# Patient Record
Sex: Female | Born: 1970 | Race: White | Hispanic: No | Marital: Single | Smoking: Current every day smoker
Health system: Southern US, Community
[De-identification: ages and names within clinical notes are randomized; demographics above are authoritative.]

## PROBLEM LIST (undated history)

## (undated) DIAGNOSIS — D649 Anemia, unspecified: Secondary | ICD-10-CM

## (undated) DIAGNOSIS — I729 Aneurysm of unspecified site: Secondary | ICD-10-CM

## (undated) DIAGNOSIS — G43909 Migraine, unspecified, not intractable, without status migrainosus: Secondary | ICD-10-CM

## (undated) DIAGNOSIS — M797 Fibromyalgia: Secondary | ICD-10-CM

## (undated) DIAGNOSIS — IMO0001 Reserved for inherently not codable concepts without codable children: Secondary | ICD-10-CM

## (undated) DIAGNOSIS — I731 Thromboangiitis obliterans [Buerger's disease]: Secondary | ICD-10-CM

## (undated) HISTORY — DX: Thromboangiitis obliterans (Buerger's disease): I73.1

## (undated) HISTORY — DX: Fibromyalgia: M79.7

## (undated) HISTORY — PX: CHOLECYSTECTOMY: SHX55

## (undated) HISTORY — DX: Migraine, unspecified, not intractable, without status migrainosus: G43.909

## (undated) HISTORY — PX: CERVICAL BIOPSY  W/ LOOP ELECTRODE EXCISION: SUR135

---

## 1999-06-12 ENCOUNTER — Encounter: Payer: Self-pay | Admitting: Occupational Medicine

## 1999-06-12 ENCOUNTER — Encounter: Admission: RE | Admit: 1999-06-12 | Discharge: 1999-06-12 | Payer: Self-pay | Admitting: Occupational Medicine

## 1999-06-30 ENCOUNTER — Encounter: Admission: RE | Admit: 1999-06-30 | Discharge: 1999-09-28 | Payer: Self-pay | Admitting: Occupational Medicine

## 2000-05-02 ENCOUNTER — Encounter: Payer: Self-pay | Admitting: Family Medicine

## 2000-05-02 ENCOUNTER — Encounter: Admission: RE | Admit: 2000-05-02 | Discharge: 2000-05-02 | Payer: Self-pay | Admitting: Family Medicine

## 2004-11-16 ENCOUNTER — Emergency Department (HOSPITAL_COMMUNITY): Admission: EM | Admit: 2004-11-16 | Discharge: 2004-11-16 | Payer: Self-pay | Admitting: Family Medicine

## 2006-01-20 ENCOUNTER — Encounter: Admission: RE | Admit: 2006-01-20 | Discharge: 2006-01-20 | Payer: Self-pay | Admitting: Family Medicine

## 2006-02-04 ENCOUNTER — Encounter: Admission: RE | Admit: 2006-02-04 | Discharge: 2006-03-08 | Payer: Self-pay | Admitting: Family Medicine

## 2006-02-18 ENCOUNTER — Encounter: Admission: RE | Admit: 2006-02-18 | Discharge: 2006-02-18 | Payer: Self-pay | Admitting: Family Medicine

## 2007-04-19 ENCOUNTER — Encounter: Admission: RE | Admit: 2007-04-19 | Discharge: 2007-04-19 | Payer: Self-pay | Admitting: *Deleted

## 2009-08-19 ENCOUNTER — Emergency Department (HOSPITAL_COMMUNITY): Admission: EM | Admit: 2009-08-19 | Discharge: 2009-08-19 | Payer: Self-pay | Admitting: Family Medicine

## 2010-11-14 ENCOUNTER — Emergency Department (HOSPITAL_COMMUNITY)
Admission: EM | Admit: 2010-11-14 | Discharge: 2010-11-14 | Disposition: A | Discharge status: Home / Self Care | Payer: Medicaid Other | Attending: Emergency Medicine | Admitting: Emergency Medicine

## 2010-11-14 DIAGNOSIS — M549 Dorsalgia, unspecified: Secondary | ICD-10-CM | POA: Insufficient documentation

## 2010-11-14 LAB — URINALYSIS, ROUTINE W REFLEX MICROSCOPIC
Ketones, ur: NEGATIVE mg/dL
Nitrite: NEGATIVE
Protein, ur: NEGATIVE mg/dL
pH: 5 (ref 5.0–8.0)

## 2010-11-14 LAB — URINE MICROSCOPIC-ADD ON

## 2010-11-14 LAB — POCT PREGNANCY, URINE: Preg Test, Ur: NEGATIVE

## 2010-11-22 ENCOUNTER — Emergency Department (HOSPITAL_COMMUNITY): Payer: Medicaid Other

## 2010-11-22 ENCOUNTER — Encounter: Payer: Self-pay | Admitting: Emergency Medicine

## 2010-11-22 ENCOUNTER — Emergency Department (HOSPITAL_COMMUNITY)
Admission: EM | Admit: 2010-11-22 | Discharge: 2010-11-22 | Disposition: A | Discharge status: Home / Self Care | Payer: Medicaid Other | Attending: Emergency Medicine | Admitting: Emergency Medicine

## 2010-11-22 DIAGNOSIS — R209 Unspecified disturbances of skin sensation: Secondary | ICD-10-CM | POA: Insufficient documentation

## 2010-11-22 DIAGNOSIS — M545 Low back pain, unspecified: Secondary | ICD-10-CM | POA: Insufficient documentation

## 2010-11-22 DIAGNOSIS — F17200 Nicotine dependence, unspecified, uncomplicated: Secondary | ICD-10-CM | POA: Insufficient documentation

## 2010-11-22 DIAGNOSIS — M5430 Sciatica, unspecified side: Secondary | ICD-10-CM | POA: Insufficient documentation

## 2010-11-22 HISTORY — DX: Anemia, unspecified: D64.9

## 2010-11-22 HISTORY — DX: Aneurysm of unspecified site: I72.9

## 2010-11-22 MED ORDER — PREDNISONE 20 MG PO TABS: ORAL_TABLET | ORAL | Status: DC

## 2010-11-22 MED ORDER — OXYCODONE-ACETAMINOPHEN 5-325 MG PO TABS
1.0000 | ORAL_TABLET | ORAL | Status: AC | PRN
Start: 2010-11-22 — End: 2010-12-02

## 2010-11-22 MED ORDER — OXYCODONE-ACETAMINOPHEN 5-325 MG PO TABS
1.0000 | ORAL_TABLET | Freq: Once | ORAL | Status: AC
  Administered 2010-11-22: 1 via ORAL
  Filled 2010-11-22: qty 1

## 2010-11-22 NOTE — ED Notes (Signed)
Pt c/o lower back pain x 3 weeks. Denies gi/gu.

## 2010-11-22 NOTE — ED Provider Notes (Signed)
History     CSN: [id number] Arrival date & time: 11/22/2010  3:18 PM  Chief Complaint  Patient presents with  . Back Pain    (Consider location/radiation/quality/duration/timing/severity/associated sxs/prior treatment) HPI Comments: Patient is a 40 year old woman who says she has severe lower back pain. She feels tingling from her lower back into the toes both legs. This has been going on for 3 weeks. She denies injury. She was seen at [facility] cone the ED 8 days ago and was prescribed nonsteroidal anti-inflammatory medications and muscle relaxants. These have not seemed to help. She therefore seeks evaluation.   Patient is a 40 y.o. female presenting with back pain. The history is provided by the patient. No language interpreter was used.  Back Pain  This is a recurrent problem. The current episode started more than 1 week ago. The problem occurs constantly. The problem has been gradually worsening. The pain is associated with no known injury. The pain is present in the lumbar spine. Radiates to: She has tingling in both legs. Pertinent negatives include no fever. She has tried NSAIDs and muscle relaxants for the symptoms. The treatment provided no relief.    Past Medical History  Diagnosis Date  . Anemia   . Aneurysm     Past Surgical History  Procedure Date  . Cholecystectomy   . Cesarean section     History reviewed. No pertinent family history.  History  Substance Use Topics  . Smoking status: Current Everyday Smoker -- 1.0 packs/day  . Smokeless tobacco: Not on file  . Alcohol Use: No    OB History    Grav Para Term Preterm Abortions TAB SAB Ect Mult Living   4 3 3  1  1   3       Review of Systems  Constitutional: Negative.  Negative for fever.  HENT: Negative.   Eyes: Negative.   Respiratory: Negative.   Cardiovascular: Negative.   Musculoskeletal: Positive for back pain.    Allergies  Vicodin  Home Medications   Current Outpatient Rx  Name Route Sig  Dispense Refill  . OXYCODONE-ACETAMINOPHEN 5-325 MG PO TABS Oral Take 1 tablet by mouth every 4 (four) hours as needed for pain. 20 tablet 0  . PREDNISONE 20 MG PO TABS  Take prednisone 20 mg, 3 tablets today and Monday, then 2 20 mg tablets Tuesday and Wednesday, and then 1 20 mg tablet Thursday and Friday. 12 tablet 0    BP 139/79  Pulse 111  Temp 98.9 F (37.2 C)  Resp 20  Ht 5\' 6"  (1.676 m)  Wt 165 lb (74.844 kg)  BMI 26.63 kg/m2  SpO2 100%  Physical Exam  Constitutional: She is oriented to person, place, and time. She appears well-developed and well-nourished. Distressed: in moderate distress with low back pain.  HENT:  Head: Normocephalic and atraumatic.  Right Ear: External ear normal.  Left Ear: External ear normal.  Eyes: Conjunctivae and EOM are normal. Pupils are equal, round, and reactive to light.  Neck: Neck supple.  Cardiovascular: Normal rate, regular rhythm and normal heart sounds.   Pulmonary/Chest: Effort normal and breath sounds normal. No respiratory distress.  Abdominal: Soft. Bowel sounds are normal. There is no tenderness.  Musculoskeletal:       She localizes pain to the lumbar region. There is no palpable deformity or bony or muscular tenderness to palpation.  Lymphadenopathy:    She has no cervical adenopathy.  Neurological: She is alert and oriented to person, place, and time.  There is no sensory or motor loss.  Skin: Skin is warm and dry.  Psychiatric: She has a normal mood and affect.    ED Course  Procedures (including critical care time)  5:24 PM Pt seen --> physical exam performed.  Prior records from previous ED visit reviewed.  X-ray of LS spine ordered.  PO Percocet ordered.  5:24 PM LS spine x-rays negative.  Rx for sciatica with Percocet for pain, steroid taper.  If she has further problems, she would need evaluation by a neurosurgeon, referred for this to Dr. Marikay Alar.  Labs Reviewed - No data to display Dg Lumbar Spine  Complete  11/22/2010  *RADIOLOGY REPORT*  Clinical Data: Back pain.  LUMBAR SPINE - COMPLETE 4+ VIEW  Comparison: None  Findings: The lateral film demonstrates normal alignment. Vertebral bodies and disc spaces are maintained.  No acute bony findings.  Normal alignment of the facet joints and no pars defects.  The visualized bony pelvis in intact.  IMPRESSION: Normal alignment and no acute bony findings.  Original Report Authenticated By: P. Loralie Champagne, M.D.     1. Sciatica       MDM          Carleene Cooper III, MD 11/22/10 [phone]

## 2010-11-28 ENCOUNTER — Emergency Department (HOSPITAL_COMMUNITY)
Admission: EM | Admit: 2010-11-28 | Discharge: 2010-11-28 | Disposition: A | Discharge status: Other Acute Inpatient Hospital | Payer: Medicaid Other | Source: Home / Self Care | Attending: Emergency Medicine | Admitting: Emergency Medicine

## 2010-11-28 ENCOUNTER — Encounter (HOSPITAL_COMMUNITY): Payer: Self-pay | Admitting: Emergency Medicine

## 2010-11-28 ENCOUNTER — Emergency Department (HOSPITAL_COMMUNITY): Payer: Medicaid Other

## 2010-11-28 ENCOUNTER — Emergency Department (HOSPITAL_COMMUNITY)
Admission: EM | Admit: 2010-11-28 | Discharge: 2010-11-29 | Disposition: A | Discharge status: Home / Self Care | Payer: Medicaid Other | Attending: Emergency Medicine | Admitting: Emergency Medicine

## 2010-11-28 DIAGNOSIS — M545 Low back pain, unspecified: Secondary | ICD-10-CM | POA: Insufficient documentation

## 2010-11-28 DIAGNOSIS — R209 Unspecified disturbances of skin sensation: Secondary | ICD-10-CM | POA: Insufficient documentation

## 2010-11-28 DIAGNOSIS — R5381 Other malaise: Secondary | ICD-10-CM | POA: Insufficient documentation

## 2010-11-28 DIAGNOSIS — R52 Pain, unspecified: Secondary | ICD-10-CM | POA: Insufficient documentation

## 2010-11-28 DIAGNOSIS — R32 Unspecified urinary incontinence: Secondary | ICD-10-CM | POA: Insufficient documentation

## 2010-11-28 DIAGNOSIS — G8929 Other chronic pain: Secondary | ICD-10-CM | POA: Insufficient documentation

## 2010-11-28 DIAGNOSIS — R29898 Other symptoms and signs involving the musculoskeletal system: Secondary | ICD-10-CM | POA: Insufficient documentation

## 2010-11-28 DIAGNOSIS — M5416 Radiculopathy, lumbar region: Secondary | ICD-10-CM

## 2010-11-28 DIAGNOSIS — R5383 Other fatigue: Secondary | ICD-10-CM | POA: Insufficient documentation

## 2010-11-28 DIAGNOSIS — IMO0002 Reserved for concepts with insufficient information to code with codable children: Secondary | ICD-10-CM | POA: Insufficient documentation

## 2010-11-28 DIAGNOSIS — F17200 Nicotine dependence, unspecified, uncomplicated: Secondary | ICD-10-CM | POA: Insufficient documentation

## 2010-11-28 DIAGNOSIS — M79609 Pain in unspecified limb: Secondary | ICD-10-CM | POA: Insufficient documentation

## 2010-11-28 DIAGNOSIS — R269 Unspecified abnormalities of gait and mobility: Secondary | ICD-10-CM | POA: Insufficient documentation

## 2010-11-28 LAB — CBC
HCT: 39 % (ref 36.0–46.0)
MCV: 87.4 fL (ref 78.0–100.0)
RBC: 4.46 MIL/uL (ref 3.87–5.11)
WBC: 11.6 10*3/uL — ABNORMAL HIGH (ref 4.0–10.5)

## 2010-11-28 LAB — DIFFERENTIAL
Lymphocytes Relative: 31 % (ref 12–46)
Lymphs Abs: 3.6 10*3/uL (ref 0.7–4.0)
Neutrophils Relative %: 58 % (ref 43–77)

## 2010-11-28 LAB — POCT I-STAT, CHEM 8
BUN: 16 mg/dL (ref 6–23)
Chloride: 101 mEq/L (ref 96–112)
Glucose, Bld: 80 mg/dL (ref 70–99)
HCT: 42 % (ref 36.0–46.0)
Potassium: 3.9 mEq/L (ref 3.5–5.1)

## 2010-11-28 MED ORDER — ONDANSETRON 8 MG PO TBDP
8.0000 mg | ORAL_TABLET | Freq: Once | ORAL | Status: AC
  Administered 2010-11-28: 8 mg via ORAL
  Filled 2010-11-28: qty 1

## 2010-11-28 MED ORDER — HYDROMORPHONE HCL 1 MG/ML IJ SOLN
2.0000 mg | Freq: Once | INTRAMUSCULAR | Status: AC
  Administered 2010-11-28: 2 mg via INTRAMUSCULAR
  Filled 2010-11-28: qty 2

## 2010-11-28 NOTE — ED Notes (Signed)
[facility] called for transport to [facility] ED as requested by Burgess Amor, PA.

## 2010-11-28 NOTE — ED Notes (Signed)
Resting quietly in bed. Continues to complain of back pain and weakness to lower extremities. Family at bedside. NAD noted.

## 2010-11-28 NOTE — ED Provider Notes (Signed)
History     CSN: [id number] Arrival date & time: 11/28/2010 11:04 AM  Chief Complaint  Patient presents with  . Back Pain    (Consider location/radiation/quality/duration/timing/severity/associated sxs/prior treatment) Patient is a 40 y.o. female presenting with back pain. The history is provided by the patient.  Back Pain  This is a chronic problem. Episode onset: she reports a 1 month history of low back pain which has slowly progressing until today when she was unable to get out of bed due to severe pain.  She denies injury. The problem occurs constantly. The problem has been gradually worsening. The pain is associated with no known injury. The pain is present in the lumbar spine. The pain radiates to the right foot and left foot. The pain is at a severity of 10/10. The pain is severe. The symptoms are aggravated by twisting, bending and certain positions. The pain is the same all the time. Associated symptoms include numbness, tingling and weakness. Pertinent negatives include no chest pain, no fever, no headaches, no abdominal pain, no bowel incontinence and no perianal numbness. Associated symptoms comments: She reports 2 episodes of urinary incontinence since last night,  Stating when she stands to try to get to the bathroom,  She has simply lost control of her urine.  Her last bowel movement was 2 days ago,  She denies the urge to go,  But points out she is on narcotics,  So may be constipated..    Past Medical History  Diagnosis Date  . Anemia   . Aneurysm     Past Surgical History  Procedure Date  . Cholecystectomy   . Cesarean section     History reviewed. No pertinent family history.  History  Substance Use Topics  . Smoking status: Current Everyday Smoker -- 1.0 packs/day  . Smokeless tobacco: Not on file  . Alcohol Use: No    OB History    Grav Para Term Preterm Abortions TAB SAB Ect Mult Living   4 3 3  1  1   3       Review of Systems  Constitutional:  Negative for fever.  HENT: Negative for congestion, sore throat and neck pain.   Eyes: Negative.   Respiratory: Negative for chest tightness and shortness of breath.   Cardiovascular: Negative for chest pain.  Gastrointestinal: Negative for nausea, abdominal pain and bowel incontinence.  Genitourinary: Negative.  Negative for urgency, frequency, hematuria and decreased urine volume.  Musculoskeletal: Positive for back pain and gait problem. Negative for joint swelling and arthralgias.  Skin: Negative.  Negative for rash and wound.  Neurological: Positive for tingling, weakness and numbness. Negative for dizziness, light-headedness and headaches.  Hematological: Negative.   Psychiatric/Behavioral: Negative.     Allergies  Vicodin  Home Medications   Current Outpatient Rx  Name Route Sig Dispense Refill  . OXYCODONE-ACETAMINOPHEN 5-325 MG PO TABS Oral Take 1 tablet by mouth every 4 (four) hours as needed for pain. 20 tablet 0  . PREDNISONE 20 MG PO TABS  Take prednisone 20 mg, 3 tablets today and Monday, then 2 20 mg tablets Tuesday and Wednesday, and then 1 20 mg tablet Thursday and Friday. 12 tablet 0    BP 122/83  Pulse 94  Temp(Src) 98.3 F (36.8 C) (Oral)  Resp 20  SpO2 99%  LMP 11/27/2010  Physical Exam  Nursing note and vitals reviewed. Constitutional: She is oriented to person, place, and time. She appears well-developed and well-nourished. She appears distressed.  HENT:  Head: Normocephalic and atraumatic.  Eyes: Conjunctivae are normal.  Neck: Normal range of motion. Neck supple.  Cardiovascular: Normal rate, regular rhythm, normal heart sounds and intact distal pulses.        Pedal pulses normal.  Pulmonary/Chest: Effort normal and breath sounds normal. She has no wheezes.  Abdominal: Soft. Bowel sounds are normal. She exhibits no distension and no mass. There is no tenderness.  Genitourinary:       Decreased rectal tone,  But normal perineal sensation to touch.    Musculoskeletal: Normal range of motion. She exhibits no edema.       Lumbar back: She exhibits tenderness. She exhibits no swelling, no edema and no spasm.  Neurological: She is alert and oriented to person, place, and time. She has normal strength. She displays no atrophy and no tremor. No cranial nerve deficit or sensory deficit. Gait normal.  Reflex Scores:      Patellar reflexes are 1+ on the right side and 2+ on the left side.      Achilles reflexes are 2+ on the right side and 2+ on the left side.      Decreased strength in hip and knee flexor and extensor muscle groups.  Ankle flexion and extension intact.    Skin: Skin is warm and dry.  Psychiatric: She has a normal mood and affect.    ED Course  Procedures (including critical care time)  Labs Reviewed - No data to display No results found.   No diagnosis found.    MDM  Discussed with Dr Adriana Simas who also saw pt.  Agrees pt needs MRI,  Unable to obtain at [facility].  Spoke with Dr. Fonnie Jarvis with [facility] - accepts patient for transfer to [facility] for back pain protocol and MRI.        Candis Musa, PA 11/28/10 1527  Candis Musa, PA 11/28/10 [phone]

## 2010-11-28 NOTE — ED Notes (Signed)
Report to [facility].  

## 2010-11-28 NOTE — ED Notes (Signed)
Pt c/o lower back pain that has gotten worse over the last month.

## 2010-11-29 ENCOUNTER — Emergency Department (HOSPITAL_COMMUNITY): Payer: Medicaid Other

## 2010-11-29 NOTE — ED Provider Notes (Signed)
Medical screening examination/treatment/procedure(s) were conducted as a shared visit with non-physician practitioner(s) and myself.  I personally evaluated the patient during the encounter.   Low back pain with radiculopathy to both feet. Bowel and bladder incontinence. Will transferred to [facility] for MRI of lower back.  Donnetta Hutching, MD 11/29/10 [phone]

## 2011-07-27 ENCOUNTER — Other Ambulatory Visit: Payer: Self-pay | Admitting: Family Medicine

## 2011-07-27 ENCOUNTER — Ambulatory Visit
Admission: RE | Admit: 2011-07-27 | Discharge: 2011-07-27 | Disposition: A | Discharge status: Home / Self Care | Payer: Medicaid Other | Source: Ambulatory Visit | Attending: Family Medicine | Admitting: Family Medicine

## 2011-07-27 DIAGNOSIS — M542 Cervicalgia: Secondary | ICD-10-CM

## 2012-07-14 ENCOUNTER — Ambulatory Visit (INDEPENDENT_AMBULATORY_CARE_PROVIDER_SITE_OTHER): Payer: BC Managed Care – PPO | Admitting: Family Medicine

## 2012-07-14 ENCOUNTER — Encounter: Payer: Self-pay | Admitting: Family Medicine

## 2012-07-14 VITALS — BP 118/72 | HR 68 | Temp 98.2°F | Resp 16 | Wt 181.0 lb

## 2012-07-14 DIAGNOSIS — M797 Fibromyalgia: Secondary | ICD-10-CM | POA: Insufficient documentation

## 2012-07-14 DIAGNOSIS — IMO0001 Reserved for inherently not codable concepts without codable children: Secondary | ICD-10-CM

## 2012-07-14 DIAGNOSIS — G43909 Migraine, unspecified, not intractable, without status migrainosus: Secondary | ICD-10-CM | POA: Insufficient documentation

## 2012-07-14 MED ORDER — DULOXETINE HCL 30 MG PO CPEP: 60.0000 mg | ORAL_CAPSULE | Freq: Every day | ORAL | Status: DC

## 2012-07-14 NOTE — Progress Notes (Signed)
  Subjective:    Patient ID: Cassie Lynn, female    DOB: 1970-08-31, 42 y.o.   MRN: [id number]  HPI Patient has fibromyalgia. She's currently on the 150 mg by mouth twice a day her last clinic visit we added Cymbalta 30 mg a day. She states that this is helping her tremendously. She still complains of pain in her elbows hips knees and hands. His pain is more of a generalized ache and muscle discomfort. But it is much improved. She denies any fevers or chills. She denies any side effects on the Cymbalta. She likes try to increase it further. Past Medical History  Diagnosis Date  . Anemia   . Aneurysm    Current outpatient prescriptions:carisoprodol (SOMA) 350 MG tablet, Take 350 mg by mouth 4 (four) times daily as needed for muscle spasms., Disp: , Rfl: ;  divalproex (DEPAKOTE) 500 MG DR tablet, Take 500 mg by mouth. 2 tab po qhs, Disp: , Rfl: ;  DULoxetine (CYMBALTA) 30 MG capsule, Take 2 capsules (60 mg total) by mouth daily., Disp: 60 capsule, Rfl: 5;  pregabalin (LYRICA) 150 MG capsule, Take 150 mg by mouth 2 (two) times daily., Disp: , Rfl:  SUMAtriptan (IMITREX) 100 MG tablet, Take 100 mg by mouth every 2 (two) hours as needed for migraine., Disp: , Rfl: ;  topiramate (TOPAMAX) 50 MG tablet, Take 50 mg by mouth. 2 tabs po qhs, Disp: , Rfl:   Allergies  Allergen Reactions  . Vicodin (Hydrocodone-Acetaminophen) Other (See Comments)    Makes her feel like she cannot breath, but no throat swelling   History   Social History  . Marital Status: Single    Spouse Name: N/A    Number of Children: N/A  . Years of Education: N/A   Occupational History  . Not on file.   Social History Main Topics  . Smoking status: Current Every Day Smoker -- 1.00 packs/day  . Smokeless tobacco: Not on file  . Alcohol Use: No  . Drug Use: No  . Sexually Active:    Other Topics Concern  . Not on file   Social History Narrative  . No narrative on file      Review of Systems  All other  systems reviewed and are negative.       Objective:   Physical Exam  Vitals reviewed. Constitutional: She appears well-developed and well-nourished.  Cardiovascular: Normal rate, regular rhythm, normal heart sounds and intact distal pulses.   Pulmonary/Chest: Effort normal and breath sounds normal. No respiratory distress. She has no wheezes. She has no rales.  Abdominal: Soft. Bowel sounds are normal. She exhibits no distension. There is no tenderness. There is no rebound.          Assessment & Plan:  1. Fibromyalgia syndrome Continue Lyrica 150 mg by mouth twice a day. Add Cymbalta 60 mg by mouth daily. Recheck in one month.

## 2012-08-11 ENCOUNTER — Ambulatory Visit: Payer: Self-pay | Admitting: Nurse Practitioner

## 2012-09-04 ENCOUNTER — Other Ambulatory Visit: Payer: Self-pay | Admitting: Family Medicine

## 2012-09-25 ENCOUNTER — Other Ambulatory Visit: Payer: Self-pay | Admitting: Emergency Medicine

## 2012-10-19 ENCOUNTER — Telehealth: Payer: Self-pay | Admitting: Family Medicine

## 2012-10-19 MED ORDER — PREGABALIN 150 MG PO CAPS: ORAL_CAPSULE | ORAL | Status: DC

## 2012-10-19 NOTE — Telephone Encounter (Signed)
?   OK to Refill  

## 2012-10-19 NOTE — Telephone Encounter (Signed)
ok 

## 2012-10-19 NOTE — Telephone Encounter (Signed)
Rx Refilled  

## 2012-10-19 NOTE — Telephone Encounter (Signed)
Lyrica 150 mg cap 1 BID #60 last rf 07/18/12

## 2012-11-13 IMAGING — CR DG LUMBAR SPINE COMPLETE 4+V
5 series · 5 of 5 positions shown · non-contrast
Comparison: None

CLINICAL DATA: Back pain.

LUMBAR SPINE - COMPLETE 4+ VIEW

[view not recorded (1 of 5)]
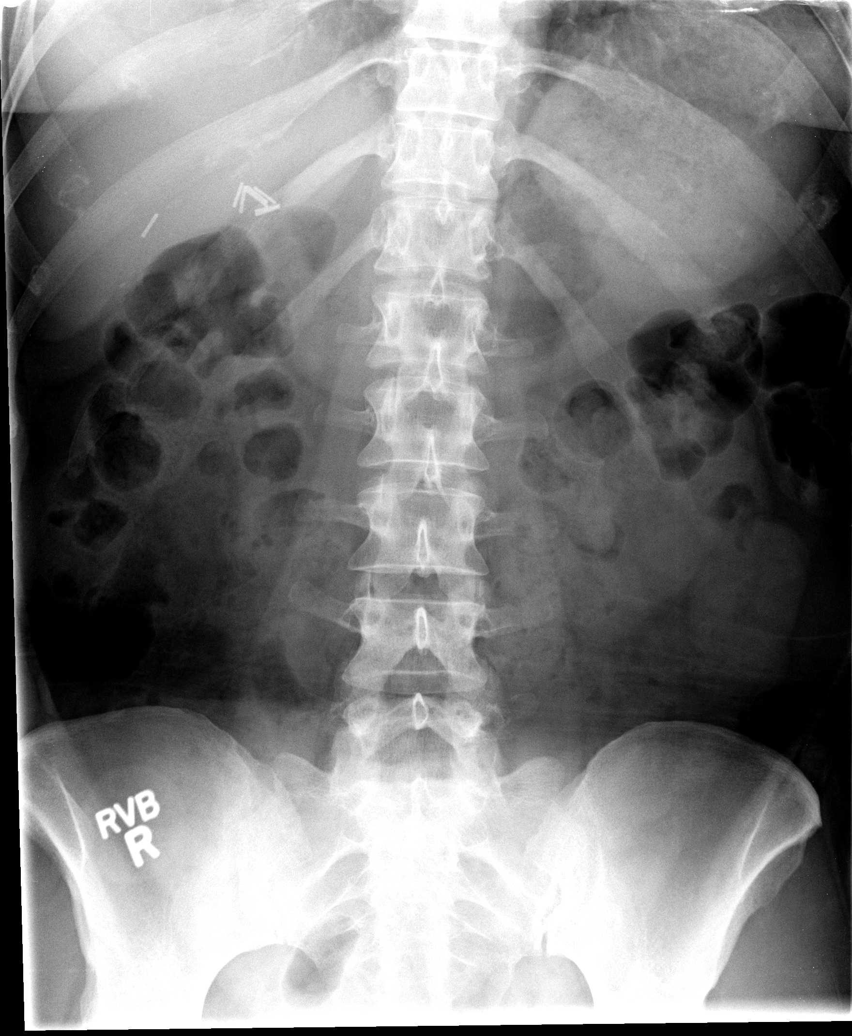

[view not recorded (2 of 5)]
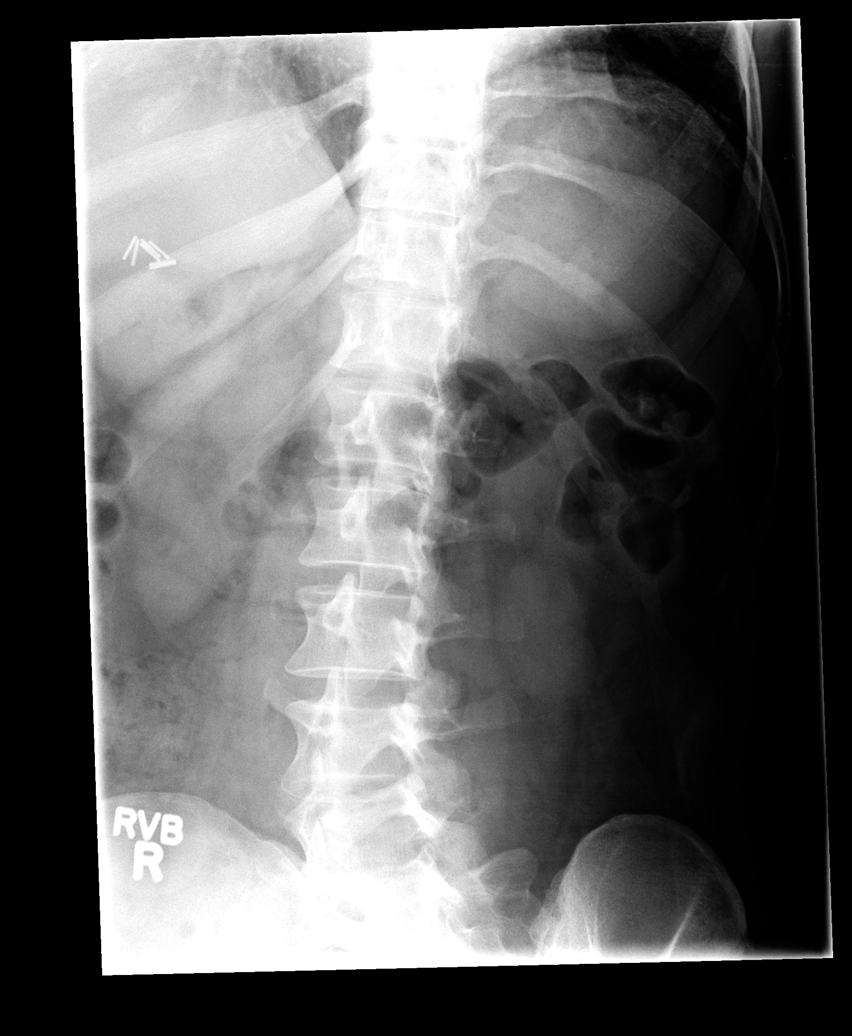

[view not recorded (3 of 5)]
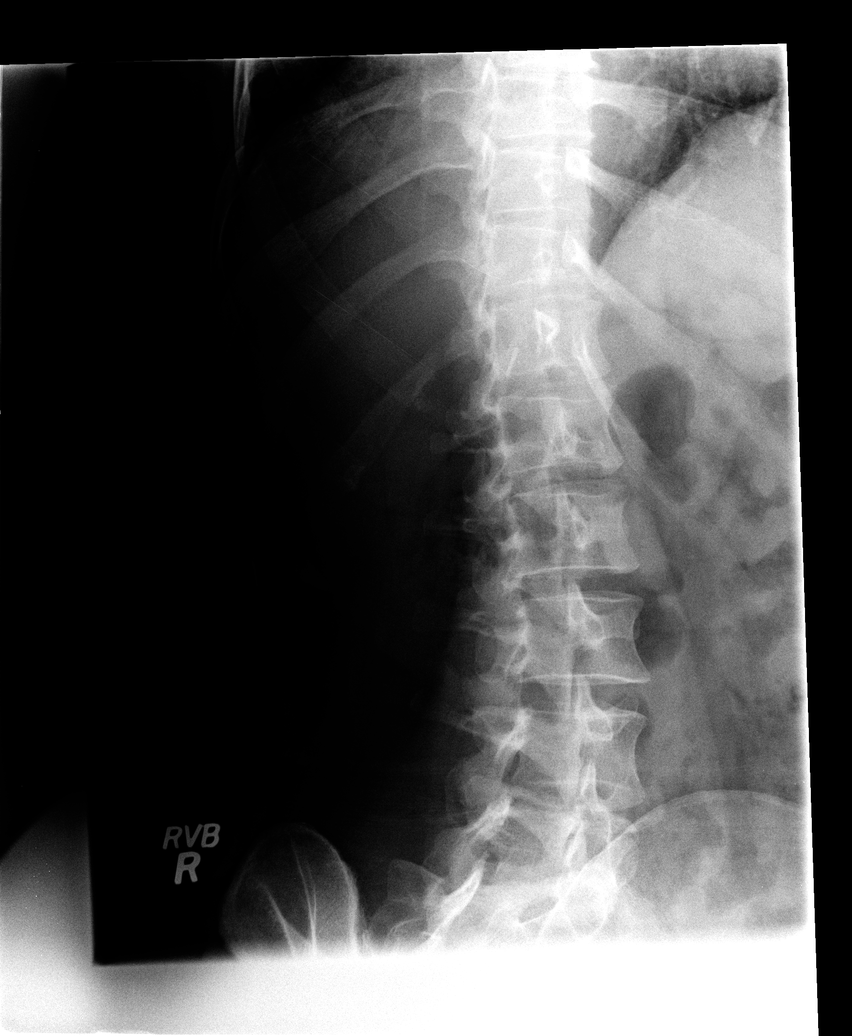

[view not recorded (4 of 5)]
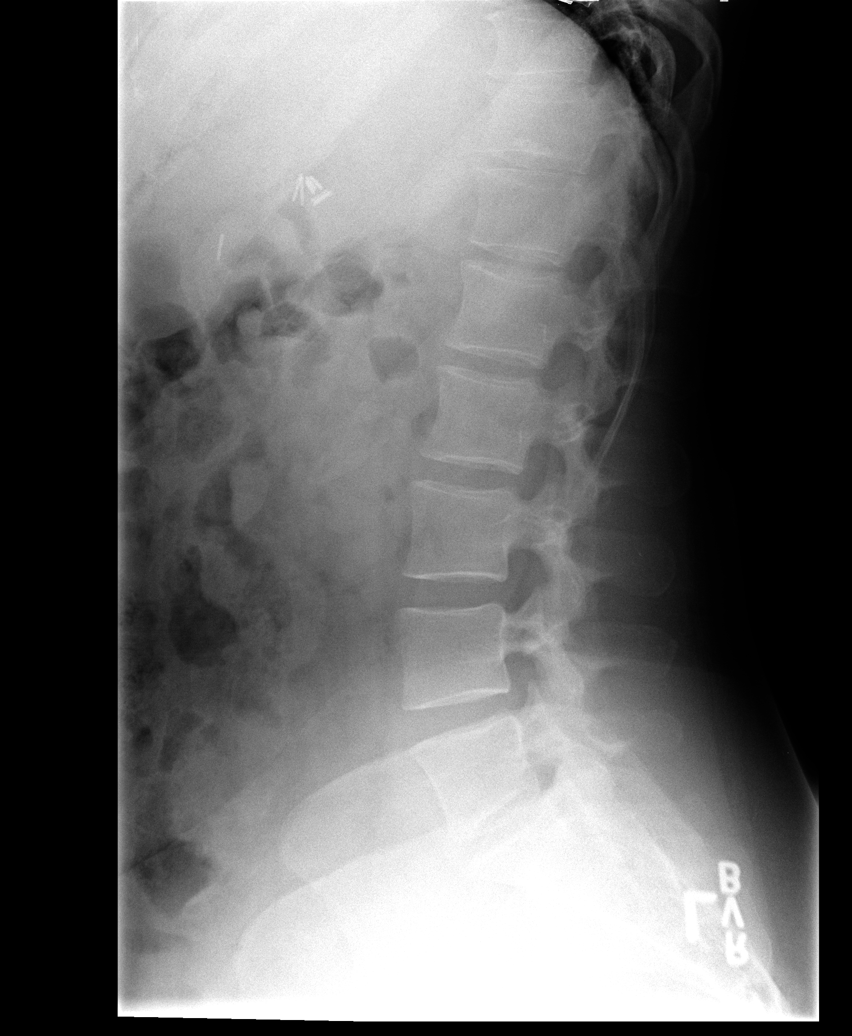

[view not recorded (5 of 5)]
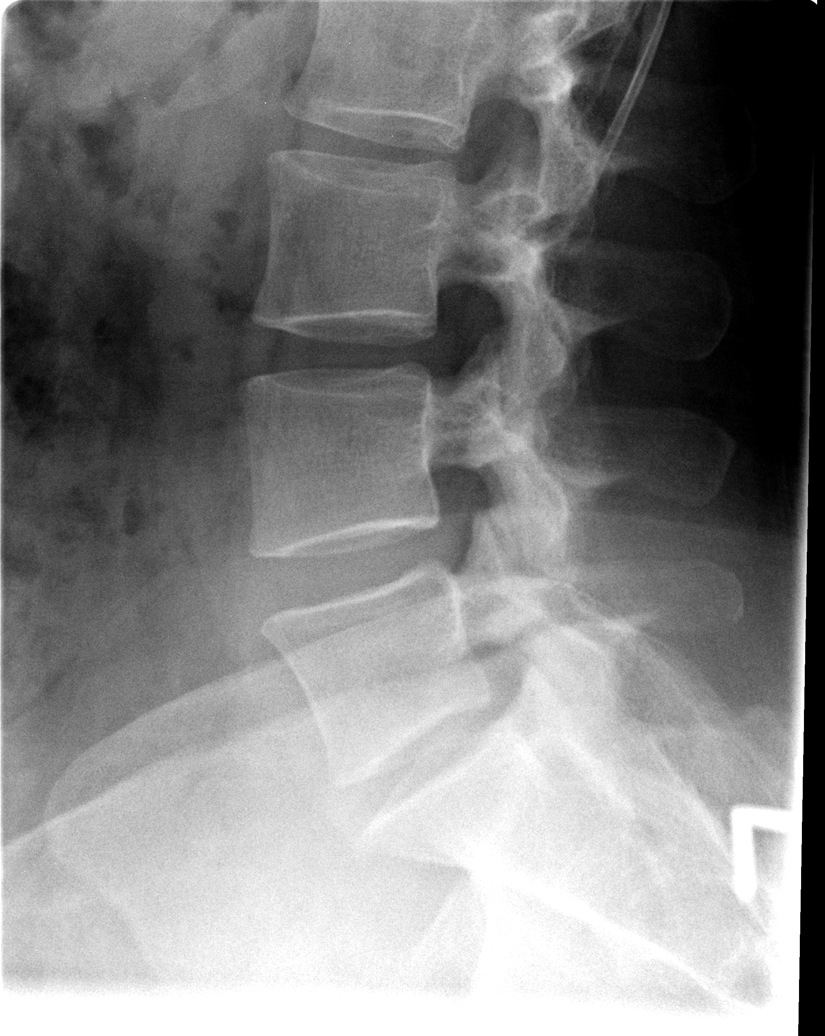

[5 of 5 positions shown; findings below may reference images not displayed]

FINDINGS: The lateral film demonstrates normal alignment.
Vertebral bodies and disc spaces are maintained.  No acute bony
findings.  Normal alignment of the facet joints and no pars
defects.  The visualized bony pelvis in intact.
IMPRESSION: Normal alignment and no acute bony findings.

## 2013-03-20 ENCOUNTER — Ambulatory Visit (INDEPENDENT_AMBULATORY_CARE_PROVIDER_SITE_OTHER): Payer: BC Managed Care – PPO | Admitting: Family Medicine

## 2013-03-20 ENCOUNTER — Encounter: Payer: Self-pay | Admitting: Family Medicine

## 2013-03-20 VITALS — BP 122/74 | HR 64 | Temp 97.3°F | Resp 14 | Ht 66.0 in | Wt 170.0 lb

## 2013-03-20 DIAGNOSIS — G5600 Carpal tunnel syndrome, unspecified upper limb: Secondary | ICD-10-CM

## 2013-03-20 DIAGNOSIS — G5603 Carpal tunnel syndrome, bilateral upper limbs: Secondary | ICD-10-CM

## 2013-03-20 MED ORDER — DULOXETINE HCL 60 MG PO CPEP: 60.0000 mg | ORAL_CAPSULE | Freq: Every day | ORAL | Status: DC

## 2013-03-20 MED ORDER — PREGABALIN 150 MG PO CAPS: ORAL_CAPSULE | ORAL | Status: DC

## 2013-03-20 MED ORDER — TOPIRAMATE 50 MG PO TABS: 50.0000 mg | ORAL_TABLET | Freq: Every day | ORAL | Status: DC

## 2013-03-20 NOTE — Progress Notes (Signed)
   Subjective:    Patient ID: Cassie Lynn, female    DOB: 31-Jul-1970, 43 y.o.   MRN: [id number]  HPI Patient presents with several months of worsening numbness in both hands. She also complains of pain and tingling and stiffness in both hands. She's recently started a job as she works with her hands all throughout the day. The numbness and pain is exacerbated by Phalen's maneuver. The patient has a positive Tinel sign bilaterally. She denies any weakness in the hands, she denies any weakness in grip strength. He does have some pain and stiffness in her neck. However he denies any cervical radicular-type symptoms. Past Medical History  Diagnosis Date  . Anemia   . Aneurysm   . Fibromyalgia   . Migraine    Current Outpatient Prescriptions on File Prior to Visit  Medication Sig Dispense Refill  . carisoprodol (SOMA) 350 MG tablet Take 350 mg by mouth 4 (four) times daily as needed for muscle spasms.      . divalproex (DEPAKOTE) 500 MG DR tablet Take 500 mg by mouth. 2 tab po qhs      . SUMAtriptan (IMITREX) 100 MG tablet Take 100 mg by mouth every 2 (two) hours as needed for migraine.       No current facility-administered medications on file prior to visit.   Allergies  Allergen Reactions  . Vicodin [Hydrocodone-Acetaminophen] Other (See Comments)    Makes her feel like she cannot breath, but no throat swelling   History   Social History  . Marital Status: Single    Spouse Name: N/A    Number of Children: N/A  . Years of Education: N/A   Occupational History  . Not on file.   Social History Main Topics  . Smoking status: Current Every Day Smoker -- 1.00 packs/day  . Smokeless tobacco: Not on file  . Alcohol Use: No  . Drug Use: No  . Sexual Activity:    Other Topics Concern  . Not on file   Social History Narrative  . No narrative on file      Review of Systems  All other systems reviewed and are negative.       Objective:   Physical Exam    Constitutional: She is oriented to person, place, and time.  Cardiovascular: Normal rate, regular rhythm and normal heart sounds.   Pulmonary/Chest: Effort normal and breath sounds normal. No respiratory distress. She has no wheezes. She has no rales.  Neurological: She is alert and oriented to person, place, and time. She has normal reflexes. She displays normal reflexes. No cranial nerve deficit. She exhibits normal muscle tone.   patient has positive Tinel sign. She has positive Phalen sign bilaterally        Assessment & Plan:  1. Bilateral carpal tunnel syndrome Patient has likely bilateral carpal tunnel syndrome. 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  She elects to proceed with cortizone injection in the right carpal tunnel. I dorsiflex the wrist to 30 degrees.  Using sterile technique I injected a mixture of 1 cc of lidocaine and 1 cc of 40 mg per mL Kenalog into the volar wrist at the distal wrist crease and lateral to the palmaris longus tendon.  The patient tolerated the procedure well without complications. Recheck in 2 weeks or sooner if worse.

## 2013-04-09 ENCOUNTER — Ambulatory Visit: Payer: BC Managed Care – PPO | Admitting: Family Medicine

## 2013-04-13 ENCOUNTER — Ambulatory Visit (INDEPENDENT_AMBULATORY_CARE_PROVIDER_SITE_OTHER): Payer: BC Managed Care – PPO | Admitting: Family Medicine

## 2013-04-13 ENCOUNTER — Encounter: Payer: Self-pay | Admitting: Family Medicine

## 2013-04-13 VITALS — BP 136/78 | HR 60 | Temp 97.3°F | Resp 18 | Ht 65.0 in | Wt 176.5 lb

## 2013-04-13 DIAGNOSIS — G5600 Carpal tunnel syndrome, unspecified upper limb: Secondary | ICD-10-CM

## 2013-04-13 MED ORDER — DIVALPROEX SODIUM 500 MG PO DR TAB
500.0000 mg | DELAYED_RELEASE_TABLET | Freq: Every day | ORAL | Status: DC
Start: 2013-04-13 — End: 2016-01-19

## 2013-04-13 MED ORDER — SUMATRIPTAN SUCCINATE 100 MG PO TABS: 100.0000 mg | ORAL_TABLET | ORAL | Status: DC | PRN

## 2013-04-13 MED ORDER — CARISOPRODOL 350 MG PO TABS: 350.0000 mg | ORAL_TABLET | Freq: Four times a day (QID) | ORAL | Status: DC | PRN

## 2013-04-13 NOTE — Progress Notes (Signed)
Subjective:    Patient ID: Cassie Lynn, female    DOB: 10-Apr-1970, 43 y.o.   MRN: [id number]  HPI 03/20/13 Patient presents with several months of worsening numbness in both hands. She also complains of pain and tingling and stiffness in both hands. She's recently started a job as she works with her hands all throughout the day. The numbness and pain is exacerbated by Phalen's maneuver. The patient has a positive Tinel sign bilaterally. She denies any weakness in the hands, she denies any weakness in grip strength. He does have some pain and stiffness in her neck. However he denies any cervical radicular-type symptoms.  At that time, my plan was: 1. Bilateral carpal tunnel syndrome Patient has likely bilateral carpal tunnel syndrome. 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  She elects to proceed with cortizone injection in the right carpal tunnel. I dorsiflex the wrist to 30 degrees.  Using sterile technique I injected a mixture of 1 cc of lidocaine and 1 cc of 40 mg per mL Kenalog into the volar wrist at the distal wrist crease and lateral to the palmaris longus tendon.  The patient tolerated the procedure well without complications. Recheck in 2 weeks or sooner if worse.  04/13/13 Patient is here today for followup. She states that the numbness in her right hand completely resolved after the Cortizone injection. She is here today requesting a cortisone injection in her left hand for carpal tunnel syndrome. She continues to have numbness in her left hand. She has burning paresthesias that radiate from her wrist into her thumb, second and third digit. Past Medical History  Diagnosis Date  . Anemia   . Aneurysm   . Fibromyalgia   . Migraine    Current Outpatient Prescriptions on File Prior to Visit  Medication Sig Dispense Refill  . DULoxetine (CYMBALTA) 60 MG capsule Take 1 capsule (60 mg total) by mouth daily.  30 capsule  5  .  pregabalin (LYRICA) 150 MG capsule TAKE ONE CAPSULE BY MOUTH TWICE A DAY  60 capsule  5  . topiramate (TOPAMAX) 50 MG tablet Take 1 tablet (50 mg total) by mouth daily. 2 tabs po qhs  60 tablet  5   No current facility-administered medications on file prior to visit.   Allergies  Allergen Reactions  . Vicodin [Hydrocodone-Acetaminophen] Other (See Comments)    Makes her feel like she cannot breath, but no throat swelling   History   Social History  . Marital Status: Single    Spouse Name: N/A    Number of Children: N/A  . Years of Education: N/A   Occupational History  . Not on file.   Social History Main Topics  . Smoking status: Current Every Day Smoker -- 1.00 packs/day  . Smokeless tobacco: Not on file  . Alcohol Use: No  . Drug Use: No  . Sexual Activity:    Other Topics Concern  . Not on file   Social History Narrative  . No narrative on file      Review of Systems  All other systems reviewed and are negative.       Objective:   Physical Exam  Constitutional: She is oriented to person, place, and time.  Cardiovascular: Normal rate, regular rhythm and normal heart sounds.   Pulmonary/Chest: Effort normal and breath sounds normal. No respiratory distress. She has no wheezes. She has no rales.  Neurological:  She is alert and oriented to person, place, and time. She has normal reflexes. No cranial nerve deficit. She exhibits normal muscle tone.   patient has positive Tinel sign. She has positive Phalen sign bilaterally        Assessment & Plan:  1. Carpal tunnel syndrome Patient has carpal tunnel syndrome in the right wrist.  I dorsiflexed the wrist to 30 degrees.  Using sterile technique I injected a mixture of 1 cc of lidocaine and 1 cc of 40 mg per mL Kenalog into the volar left wrist at the distal wrist crease and lateral to the palmaris longus tendon.  The patient tolerated the procedure well without complications. Recheck in 2 weeks or sooner if worse.

## 2013-06-12 ENCOUNTER — Other Ambulatory Visit: Payer: Self-pay | Admitting: Family Medicine

## 2013-06-13 NOTE — Telephone Encounter (Signed)
?   OK to Refill  

## 2013-06-14 NOTE — Telephone Encounter (Signed)
ok 

## 2013-06-14 NOTE — Telephone Encounter (Signed)
Prescription printed and patient made aware to come to office to pick up per VM.  

## 2013-06-22 ENCOUNTER — Ambulatory Visit
Admission: RE | Admit: 2013-06-22 | Discharge: 2013-06-22 | Disposition: A | Discharge status: Home / Self Care | Payer: BC Managed Care – PPO | Source: Ambulatory Visit | Attending: Family Medicine | Admitting: Family Medicine

## 2013-06-22 ENCOUNTER — Ambulatory Visit (INDEPENDENT_AMBULATORY_CARE_PROVIDER_SITE_OTHER): Payer: BC Managed Care – PPO | Admitting: Family Medicine

## 2013-06-22 ENCOUNTER — Encounter: Payer: Self-pay | Admitting: Family Medicine

## 2013-06-22 VITALS — BP 132/78 | HR 80 | Temp 99.1°F | Resp 18 | Ht 66.0 in | Wt 174.0 lb

## 2013-06-22 DIAGNOSIS — N898 Other specified noninflammatory disorders of vagina: Secondary | ICD-10-CM

## 2013-06-22 DIAGNOSIS — M542 Cervicalgia: Secondary | ICD-10-CM

## 2013-06-22 DIAGNOSIS — R519 Headache, unspecified: Secondary | ICD-10-CM

## 2013-06-22 DIAGNOSIS — R51 Headache: Secondary | ICD-10-CM

## 2013-06-22 DIAGNOSIS — N939 Abnormal uterine and vaginal bleeding, unspecified: Secondary | ICD-10-CM

## 2013-06-22 LAB — CBC WITH DIFFERENTIAL/PLATELET
Basophils Absolute: 0.1 10*3/uL (ref 0.0–0.1)
Basophils Relative: 1 % (ref 0–1)
Eosinophils Absolute: 0.2 10*3/uL (ref 0.0–0.7)
Eosinophils Relative: 2 % (ref 0–5)
HCT: 40 % (ref 36.0–46.0)
HEMOGLOBIN: 13.7 g/dL (ref 12.0–15.0)
LYMPHS ABS: 3.1 10*3/uL (ref 0.7–4.0)
Lymphocytes Relative: 31 % (ref 12–46)
MCH: 30 pg (ref 26.0–34.0)
MCHC: 34.3 g/dL (ref 30.0–36.0)
MCV: 87.5 fL (ref 78.0–100.0)
MONOS PCT: 5 % (ref 3–12)
Monocytes Absolute: 0.5 10*3/uL (ref 0.1–1.0)
NEUTROS PCT: 61 % (ref 43–77)
Neutro Abs: 6.1 10*3/uL (ref 1.7–7.7)
Platelets: 318 10*3/uL (ref 150–400)
RBC: 4.57 MIL/uL (ref 3.87–5.11)
RDW: 15 % (ref 11.5–15.5)
WBC: 10 10*3/uL (ref 4.0–10.5)

## 2013-06-22 NOTE — Progress Notes (Signed)
Subjective:    Patient ID: Cassie Lynn, female    DOB: 1971-02-15, 43 y.o.   MRN: [id number]  HPI  Patient presents with 3 months of frequent irregular periods. She states she is having 5 days of vaginal bleeding every 2 weeks. It is also extremely heavy. This is causing her to feel dizzy, weak, and tired. His been several years since she has had a Pap smear in Vaginal exam. She also smokes. She is on no control or hormones. She also has a history of chronic migraines. However recently she has developed pain in her neck. Is located in the center of the cervical spine on the level of C3-C4. It radiates up her neck into the back of her head causing a severe migraine. It also radiates into both arms causing numbness and tingling in the arms. This is causing worsening of her chronic migraines. This has been going on for several months and seems to be getting worse. Past Medical History  Diagnosis Date  . Anemia   . Aneurysm   . Fibromyalgia   . Migraine    Past Surgical History  Procedure Laterality Date  . Cholecystectomy    . Cesarean section     Current Outpatient Prescriptions on File Prior to Visit  Medication Sig Dispense Refill  . carisoprodol (SOMA) 350 MG tablet TAKE 1 TABLET BY MOUTH 4 TIMES DAILY FOR MUSCLE SPASMS  30 tablet  0  . divalproex (DEPAKOTE) 500 MG DR tablet Take 1 tablet (500 mg total) by mouth at bedtime. 2 tab po qhs  60 tablet  2  . DULoxetine (CYMBALTA) 60 MG capsule Take 1 capsule (60 mg total) by mouth daily.  30 capsule  5  . pregabalin (LYRICA) 150 MG capsule TAKE ONE CAPSULE BY MOUTH TWICE A DAY  60 capsule  5  . SUMAtriptan (IMITREX) 100 MG tablet Take 1 tablet (100 mg total) by mouth every 2 (two) hours as needed for migraine.  10 tablet  0  . topiramate (TOPAMAX) 50 MG tablet Take 1 tablet (50 mg total) by mouth daily. 2 tabs po qhs  60 tablet  5   No current facility-administered medications on file prior to visit.   Allergies  Allergen Reactions   . Vicodin [Hydrocodone-Acetaminophen] Other (See Comments)    Makes her feel like she cannot breath, but no throat swelling   History   Social History  . Marital Status: Single    Spouse Name: N/A    Number of Children: N/A  . Years of Education: N/A   Occupational History  . Not on file.   Social History Main Topics  . Smoking status: Current Every Day Smoker -- 1.00 packs/day  . Smokeless tobacco: Not on file  . Alcohol Use: No  . Drug Use: No  . Sexual Activity:    Other Topics Concern  . Not on file   Social History Narrative  . No narrative on file     Review of Systems  All other systems reviewed and are negative.      Objective:   Physical Exam  Vitals reviewed. Constitutional: She is oriented to person, place, and time.  Cardiovascular: Normal rate, regular rhythm, normal heart sounds and intact distal pulses.   Pulmonary/Chest: Effort normal and breath sounds normal. No respiratory distress. She has no wheezes. She has no rales. She exhibits no tenderness.  Abdominal: Soft. Bowel sounds are normal. She exhibits no distension. There is no tenderness.  Musculoskeletal:  Cervical back: She exhibits decreased range of motion, tenderness and pain. She exhibits no bony tenderness, no swelling, no edema, no deformity and no spasm.  Neurological: She is alert and oriented to person, place, and time. She has normal reflexes. She displays normal reflexes. No cranial nerve deficit. She exhibits normal muscle tone. Coordination normal.   Patient refuses a pelvic exam today because she is currently bleeding.       Assessment & Plan:  1. Abnormal vaginal bleeding Patient's abnormal vaginal bleeding sounds like menometrorrhagia.  However given the patient's age and her smoking, I feel she needs a GYN consult for an endometrial biopsy to rule out endometrial cancer. I will obtain a CBC today and level to evaluate her in deficiency anemia. - CBC with  Differential - COMPLETE METABOLIC PANEL WITH GFR - Iron - Ambulatory referral to Gynecology  2. 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 I will begin with an MRI of the cervical spine to evaluate for nerve impingement/nerve entrapment. - MR Cervical Spine Wo Contrast; Future  3. Worsening headaches  - MR Cervical Spine Wo Contrast; Future

## 2013-06-23 LAB — COMPLETE METABOLIC PANEL WITH GFR
ALT: 12 U/L (ref 0–35)
AST: 14 U/L (ref 0–37)
Albumin: 4 g/dL (ref 3.5–5.2)
Alkaline Phosphatase: 49 U/L (ref 39–117)
BILIRUBIN TOTAL: 0.3 mg/dL (ref 0.2–1.2)
BUN: 12 mg/dL (ref 6–23)
CO2: 21 meq/L (ref 19–32)
Calcium: 8.8 mg/dL (ref 8.4–10.5)
Chloride: 104 mEq/L (ref 96–112)
Creat: 0.66 mg/dL (ref 0.50–1.10)
GLUCOSE: 140 mg/dL — AB (ref 70–99)
Potassium: 4.1 mEq/L (ref 3.5–5.3)
Sodium: 136 mEq/L (ref 135–145)
TOTAL PROTEIN: 6.7 g/dL (ref 6.0–8.3)

## 2013-06-23 LAB — IRON: Iron: 100 ug/dL (ref 42–145)

## 2013-07-05 ENCOUNTER — Telehealth: Payer: Self-pay | Admitting: *Deleted

## 2013-07-05 MED ORDER — CARISOPRODOL 350 MG PO TABS: ORAL_TABLET | ORAL | Status: DC

## 2013-07-05 NOTE — Telephone Encounter (Signed)
Med called to pharm 

## 2013-07-05 NOTE — Telephone Encounter (Signed)
ok 

## 2013-07-05 NOTE — Telephone Encounter (Signed)
?   OK to Refill  

## 2013-07-05 NOTE — Telephone Encounter (Signed)
Refill carisoprodol (SOMA) 350 MG tablet called into [facility] [address]

## 2013-08-17 ENCOUNTER — Other Ambulatory Visit: Payer: Self-pay | Admitting: Family Medicine

## 2013-08-17 NOTE — Telephone Encounter (Signed)
?   OK to Refill  

## 2013-08-20 NOTE — Telephone Encounter (Signed)
Medication called to pharmacy. 

## 2013-08-20 NOTE — Telephone Encounter (Signed)
ok 

## 2013-12-24 ENCOUNTER — Encounter: Payer: Self-pay | Admitting: Family Medicine

## 2014-03-18 ENCOUNTER — Encounter: Payer: Self-pay | Admitting: Family Medicine

## 2014-12-07 ENCOUNTER — Emergency Department (HOSPITAL_COMMUNITY)
Admission: EM | Admit: 2014-12-07 | Discharge: 2014-12-07 | Disposition: A | Discharge status: Home / Self Care | Payer: Medicaid Other | Attending: Emergency Medicine | Admitting: Emergency Medicine

## 2014-12-07 ENCOUNTER — Encounter (HOSPITAL_COMMUNITY): Payer: Self-pay

## 2014-12-07 ENCOUNTER — Emergency Department (HOSPITAL_COMMUNITY): Payer: Medicaid Other

## 2014-12-07 DIAGNOSIS — Z79899 Other long term (current) drug therapy: Secondary | ICD-10-CM | POA: Insufficient documentation

## 2014-12-07 DIAGNOSIS — M797 Fibromyalgia: Secondary | ICD-10-CM | POA: Insufficient documentation

## 2014-12-07 DIAGNOSIS — M5432 Sciatica, left side: Secondary | ICD-10-CM | POA: Insufficient documentation

## 2014-12-07 DIAGNOSIS — Z862 Personal history of diseases of the blood and blood-forming organs and certain disorders involving the immune mechanism: Secondary | ICD-10-CM | POA: Insufficient documentation

## 2014-12-07 DIAGNOSIS — Z7952 Long term (current) use of systemic steroids: Secondary | ICD-10-CM | POA: Insufficient documentation

## 2014-12-07 DIAGNOSIS — Z720 Tobacco use: Secondary | ICD-10-CM | POA: Insufficient documentation

## 2014-12-07 DIAGNOSIS — G43909 Migraine, unspecified, not intractable, without status migrainosus: Secondary | ICD-10-CM | POA: Insufficient documentation

## 2014-12-07 MED ORDER — TRAMADOL HCL 50 MG PO TABS: 50.0000 mg | ORAL_TABLET | Freq: Once | ORAL | Status: DC

## 2014-12-07 MED ORDER — PREDNISONE 10 MG PO TABS: 20.0000 mg | ORAL_TABLET | Freq: Every day | ORAL | Status: DC

## 2014-12-07 MED ORDER — TRAMADOL HCL 50 MG PO TABS: 50.0000 mg | ORAL_TABLET | Freq: Four times a day (QID) | ORAL | Status: DC | PRN

## 2014-12-07 NOTE — ED Provider Notes (Signed)
CSN: [id number]     Arrival date & time 12/07/14  1311 History  By signing my name below, I, Starleen Arms, attest that this documentation has been prepared under the direction and in the presence of Delos Haring, PA-C. Electronically Signed: Starleen Arms ED Scribe. 12/07/2014. 2:50 PM.   Chief Complaint  Patient presents with  . Hip Pain   The history is provided by the patient. No language interpreter was used.   HPI Comments: Cassie Lynn is a 44 y.o. female who presents to the Emergency Department complaining of left lower back/buttock pain that radiates down the left leg onset 1 week ago, worse with sitting, relieved with standing up/laying down.  The complaint is not relieved by Tylenol, BC Powder, or ibuprofen.  Associated symptoms include numbness in the left foot.  She denies hx of IVDU, CA.  She denies She denies injury, rash, fever, flank pain, bowell/bladder incontinence.   No numbness or weakness.  Past Medical History  Diagnosis Date  . Anemia   . Aneurysm ([place])   . Fibromyalgia   . Migraine    Past Surgical History  Procedure Laterality Date  . Cholecystectomy    . Cesarean section     History reviewed. No pertinent family history. Social History  Substance Use Topics  . Smoking status: Current Every Day Smoker -- 1.00 packs/day  . Smokeless tobacco: None  . Alcohol Use: No   OB History    Gravida Para Term Preterm AB TAB SAB Ectopic Multiple Living   4 3 3  1  1   3      Review of Systems A complete 10 system review of systems was obtained and all systems are negative except as noted in the HPI and PMH.   Allergies  Vicodin  Home Medications   Prior to Admission medications   Medication Sig Start Date End Date Taking? Authorizing Provider  carisoprodol (SOMA) 350 MG tablet TAKE 1 TABLET BY MOUTH 4 TIMES A DAY AS NEEDED FOR MUSCLE SPASM    Susy Frizzle, MD  divalproex (DEPAKOTE) 500 MG DR tablet Take 1 tablet (500 mg total) by mouth at bedtime. 2  tab po qhs 04/13/13   Susy Frizzle, MD  DULoxetine (CYMBALTA) 60 MG capsule Take 1 capsule (60 mg total) by mouth daily. 03/20/13   Susy Frizzle, MD  predniSONE (DELTASONE) 10 MG tablet Take 2 tablets (20 mg total) by mouth daily. 12/07/14   Cassian Torelli Carlota Raspberry, PA-C  pregabalin (LYRICA) 150 MG capsule TAKE ONE CAPSULE BY MOUTH TWICE A DAY 03/20/13   Susy Frizzle, MD  SUMAtriptan (IMITREX) 100 MG tablet TAKE 1 TABLET BY MOUTH EVERY 2 HOURS AS NEEDED FOR MIGRAINE.    Susy Frizzle, MD  topiramate (TOPAMAX) 50 MG tablet Take 1 tablet (50 mg total) by mouth daily. 2 tabs po qhs 03/20/13   Susy Frizzle, MD  traMADol (ULTRAM) 50 MG tablet Take 1 tablet (50 mg total) by mouth every 6 (six) hours as needed. 12/07/14   Delos Haring, PA-C   LMP 11/23/2014 (Approximate) Physical Exam  Constitutional: She is oriented to person, place, and time. She appears well-developed and well-nourished. No distress.  HENT:  Head: Normocephalic and atraumatic.  Eyes: Conjunctivae and EOM are normal.  Neck: Neck supple. No tracheal deviation present.  Cardiovascular: Normal rate.   Pulmonary/Chest: Effort normal. No respiratory distress.  Musculoskeletal: Normal range of motion.  Symmetrical and physiologic strength to bilateral lower extremities.  Neurosensory function adequate  to both legs Skin color is normal. Skin is warm and moist.  No step off deformity appreciated and no midline bony tenderness.  Can ambulate with some discomfort.  No crepitus, laceration, effusion, induration, lesions Pedal pulses are symmetrical and palpable bilaterally  Tenderness to palpation of paraspinal and left hip and tailbone. No clonus on dorsiflextion   Neurological: She is alert and oriented to person, place, and time.  Skin: Skin is warm and dry.  Psychiatric: She has a normal mood and affect. Her behavior is normal.  Nursing note and vitals reviewed.   ED Course  Procedures (including critical care  time)  COORDINATION OF CARE:  3:00 PM Discussed negative imaging with patient.  Will prescribe oral steroid, muscle relaxant, and pain medication.  Patient should f/u with PCP.    Labs Review Labs Reviewed - No data to display  Imaging Review Dg Hip Unilat With Pelvis 2-3 Views Left  12/07/2014  CLINICAL DATA:  44 year old female with left posterior hip pain for 1 week radiating down the left leg. No history of trauma. EXAM: DG HIP (WITH OR WITHOUT PELVIS) 2-3V LEFT COMPARISON:  No priors. FINDINGS: There is no evidence of hip fracture or dislocation. There is no evidence of arthropathy or other focal bone abnormality. IMPRESSION: Negative. Electronically Signed   By: Vinnie Langton M.D.   On: 12/07/2014 14:19   I have personally reviewed and evaluated these image results as part of my medical decision-making.   EKG Interpretation None      MDM   Final diagnoses:  Fibromyalgia  Sciatica of left side    44 y.o.Cassie Lynn's  with back pain.   No neurological deficits and normal neuro exam. No loss of bowel or bladder control. No concern for cauda equina at this time base on HPI and physical exam findings. No fever, night sweats, weight loss, h/o cancer, IVDU. The patient can walk with some discomfort.   Patient Plan 1. Medications: NSAIDs and/or muscle relaxer. Cont usual home medications unless otherwise directed. 2. Treatment: rest, drink plenty of fluids, gentle stretching as discussed, alternate ice and heat  3. Follow Up: Please followup with your primary doctor for discussion of your diagnoses and further evaluation after today's visit; if you do not have a primary care doctor use the resource guide provided to find one  Advised to follow-up with the orthopedist if symptoms do not start to resolve in the next 2-3 days. If develop loss of bowel or urinary control return to the ED as soon as possible for further evaluation. To take the medications as prescribed as they  can cause harm if not taken appropriately.   Vital signs are stable at discharge. There were no vitals filed for this visit.  Patient/guardian has voiced understanding and agreed to follow-up with the PCP or specialist.   I personally performed the services described in this documentation, which was scribed in my presence. The recorded information has been reviewed and is accurate.     Delos Haring, PA-C 12/07/14 [place] Y[place], MD 12/07/14 [phone]

## 2014-12-07 NOTE — Discharge Instructions (Signed)

## 2014-12-07 NOTE — Progress Notes (Signed)
Pt stated,"I can't sir down it hurts to bad." pt c/o pain in her left hip and low back./ She stated both of my feet feel numb cause all I do is walk."

## 2014-12-07 NOTE — ED Notes (Signed)
She c/o non-traumatic left hip/leg pain.  She is in no distress.

## 2014-12-12 ENCOUNTER — Encounter (HOSPITAL_COMMUNITY): Payer: Self-pay | Admitting: *Deleted

## 2014-12-12 ENCOUNTER — Emergency Department (HOSPITAL_COMMUNITY)
Admission: EM | Admit: 2014-12-12 | Discharge: 2014-12-12 | Disposition: A | Discharge status: Home / Self Care | Payer: Self-pay | Attending: Emergency Medicine | Admitting: Emergency Medicine

## 2014-12-12 ENCOUNTER — Emergency Department (HOSPITAL_COMMUNITY): Payer: Medicaid Other

## 2014-12-12 DIAGNOSIS — R32 Unspecified urinary incontinence: Secondary | ICD-10-CM | POA: Insufficient documentation

## 2014-12-12 DIAGNOSIS — Z720 Tobacco use: Secondary | ICD-10-CM | POA: Insufficient documentation

## 2014-12-12 DIAGNOSIS — Z79899 Other long term (current) drug therapy: Secondary | ICD-10-CM | POA: Insufficient documentation

## 2014-12-12 DIAGNOSIS — G43909 Migraine, unspecified, not intractable, without status migrainosus: Secondary | ICD-10-CM | POA: Insufficient documentation

## 2014-12-12 DIAGNOSIS — Z862 Personal history of diseases of the blood and blood-forming organs and certain disorders involving the immune mechanism: Secondary | ICD-10-CM | POA: Insufficient documentation

## 2014-12-12 DIAGNOSIS — M797 Fibromyalgia: Secondary | ICD-10-CM | POA: Insufficient documentation

## 2014-12-12 DIAGNOSIS — Z7952 Long term (current) use of systemic steroids: Secondary | ICD-10-CM | POA: Insufficient documentation

## 2014-12-12 DIAGNOSIS — M5442 Lumbago with sciatica, left side: Secondary | ICD-10-CM | POA: Insufficient documentation

## 2014-12-12 MED ORDER — METHOCARBAMOL 500 MG PO TABS: 500.0000 mg | ORAL_TABLET | Freq: Two times a day (BID) | ORAL | Status: DC

## 2014-12-12 MED ORDER — HYDROMORPHONE HCL 2 MG/ML IJ SOLN
2.0000 mg | Freq: Once | INTRAMUSCULAR | Status: AC
  Administered 2014-12-12: 2 mg via INTRAMUSCULAR
  Filled 2014-12-12: qty 1

## 2014-12-12 MED ORDER — OXYCODONE-ACETAMINOPHEN 5-325 MG PO TABS: 1.0000 | ORAL_TABLET | ORAL | Status: DC | PRN

## 2014-12-12 MED ORDER — PREDNISONE 10 MG PO TABS: 20.0000 mg | ORAL_TABLET | Freq: Every day | ORAL | Status: DC

## 2014-12-12 NOTE — ED Notes (Signed)
Pt states that she was seen the other day and was diagnosed with sciatica; pt states that she is still having pain radiating down both legs on lower back; pt states that she is unable to get comfortable; pt was prescribed Prednisone and Tramadol and states that she is out of meds and that the pain has continued; pt ambulating in triage in room; steady gait

## 2014-12-12 NOTE — ED Provider Notes (Signed)
CSN: [id number]     Arrival date & time 12/12/14  1847 History  By signing my name below, I, Soijett Blue, attest that this documentation has been prepared under the direction and in the presence of Charlann Lange, PA-C Electronically Signed: Soijett Blue, ED Scribe. 12/12/2014. 8:19 PM.   Chief Complaint  Patient presents with  . Back Pain      The history is provided by the patient. No language interpreter was used.    HPI Comments: Cassie Lynn is a 44 y.o. female presents to the Emergency Department complaining of progressively worsening low back pain onset 3 weeks. She denies any surgeries to her back. She reports that the back pain does radiate down the back of her bilateral legs. She states that her back pain is alleviated with laying down and worsened with sitting. She notes that she was seen in the ED for her symptoms on 12/07/14 and was Rx prednisone and tramadol which she is now out of. She notes that since being out of the Rx medications her pain has continued. She reports that she has not had back problems until 3 weeks ago and she denies recent injury/trauma. She notes that she thinks that she pulled a muscle while lifting a box. She states that she is having associated symptoms of bladder incontinence x 3-4 days due to pain. She states that she has tried ibuprofen with no relief for her symptoms. Pt denies gait problem but mother at bedside reports she isn't able to lift her left foot when walking. There has been no bowel incontinence, abdominal pain, or any other symptoms. Denies CA or IV drug use. She reports that she is allergic to vicodin.    Past Medical History  Diagnosis Date  . Anemia   . Aneurysm ([place])   . Fibromyalgia   . Migraine    Past Surgical History  Procedure Laterality Date  . Cholecystectomy    . Cesarean section     No family history on file. Social History  Substance Use Topics  . Smoking status: Current Every Day Smoker -- 1.00 packs/day  .  Smokeless tobacco: None  . Alcohol Use: No   OB History    Gravida Para Term Preterm AB TAB SAB Ectopic Multiple Living   4 3 3  1  1   3      Review of Systems  Gastrointestinal: Negative for nausea, vomiting and abdominal pain.       No bowel incontinence  Genitourinary:       Bladder incontinence  Musculoskeletal: Positive for back pain.  Skin: Negative for color change.  Neurological: Positive for weakness. Negative for numbness.      Allergies  Vicodin  Home Medications   Prior to Admission medications   Medication Sig Start Date End Date Taking? Authorizing Provider  carisoprodol (SOMA) 350 MG tablet TAKE 1 TABLET BY MOUTH 4 TIMES A DAY AS NEEDED FOR MUSCLE SPASM    Susy Frizzle, MD  divalproex (DEPAKOTE) 500 MG DR tablet Take 1 tablet (500 mg total) by mouth at bedtime. 2 tab po qhs 04/13/13   Susy Frizzle, MD  DULoxetine (CYMBALTA) 60 MG capsule Take 1 capsule (60 mg total) by mouth daily. 03/20/13   Susy Frizzle, MD  predniSONE (DELTASONE) 10 MG tablet Take 2 tablets (20 mg total) by mouth daily. 12/07/14   Tiffany Carlota Raspberry, PA-C  pregabalin (LYRICA) 150 MG capsule TAKE ONE CAPSULE BY MOUTH TWICE A DAY 03/20/13   Cletus Gash  Avel Peace, MD  SUMAtriptan (IMITREX) 100 MG tablet TAKE 1 TABLET BY MOUTH EVERY 2 HOURS AS NEEDED FOR MIGRAINE.    Susy Frizzle, MD  topiramate (TOPAMAX) 50 MG tablet Take 1 tablet (50 mg total) by mouth daily. 2 tabs po qhs 03/20/13   Susy Frizzle, MD  traMADol (ULTRAM) 50 MG tablet Take 1 tablet (50 mg total) by mouth every 6 (six) hours as needed. 12/07/14   Tiffany Carlota Raspberry, PA-C   BP 142/100 mmHg  Pulse 93  Temp(Src) 98.8 F (37.1 C) (Oral)  Resp 21  SpO2 100%  LMP 11/23/2014 (Approximate) Physical Exam  Constitutional: She is oriented to person, place, and time. She appears well-developed and well-nourished. No distress.  HENT:  Head: Normocephalic and atraumatic.  Eyes: EOM are normal.  Neck: Neck supple.  Cardiovascular:  Normal rate and intact distal pulses.   Pulmonary/Chest: Effort normal. No respiratory distress.  Abdominal: Soft. There is no tenderness.  Musculoskeletal: Normal range of motion.  Left paralumbar tenderness without swelling or discoloration. She is ambulatory. Difficult to move to a sitting position due to pain. Full strength in bilateral lower extremities.  Neurological: She is alert and oriented to person, place, and time.  Dragging left foot while ambulatory. Normal reflexes that are equal bilaterally.   Skin: Skin is warm and dry.  Psychiatric: She has a normal mood and affect. Her behavior is normal.  Nursing note and vitals reviewed.   ED Course  Procedures (including critical care time) DIAGNOSTIC STUDIES: Oxygen Saturation is 100% on RA, nl by my interpretation.    COORDINATION OF CARE: 8:24 PM Discussed treatment plan with pt at bedside which includes dilaudid and MR lumbar spine and pt agreed to plan.    Labs Review Labs Reviewed - No data to display  Imaging Review Mr Lumbar Spine Wo Contrast  12/12/2014  CLINICAL DATA:  Initial evaluation for acute left lower back pain radiating down left leg. EXAM: MRI LUMBAR SPINE WITHOUT CONTRAST TECHNIQUE: Multiplanar, multisequence MR imaging of the lumbar spine was performed. No intravenous contrast was administered. COMPARISON:  Prior study from 11/29/2010. FINDINGS: For the purposes of this dictation, the lowest well-formed intervertebral disc spaces presumed to be the L5-S1 level, and there presumed to be 5 lumbar type vertebral bodies. Vertebral bodies are normally aligned with preservation of the normal lumbar lordosis. Vertebral body heights are well preserved. No acute fracture listhesis. Signal intensity within the vertebral body bone marrow is normal. No focal osseous lesion. No marrow edema. Conus medullaris terminates normally at the L1 level. Signal intensity within the visualized cord is normal. Nerve roots of the cauda  equina unremarkable. Paraspinous soft tissues demonstrate no acute abnormality. At T11-12 thru L3-4, there is no significant disc pathology. No significant facet arthrosis. No canal or foraminal stenosis. L4-5: Mild diffuse degenerative disc bulge with disc desiccation. Facet and ligamentous hypertrophy. Small superimposed left far lateral shallow disc protrusion, closely approximating the exiting left L4 nerve root (series 7, image 21). Additional shallow right paracentral disc protrusion at this level without associated neural impingement or stenosis (series 6, image 22). Central canal and neural foramina remain widely patent. L5-S1: Disc desiccation with intervertebral disc space narrowing and associated endplate changes. Left paracentral disc protrusion indenting the left lateral recess, abutting the transiting left S1 nerve root (series 7, image 27). Slight caudad angulation with the protruding disc. Associated annular fissure. Mild facet arthrosis. No significant canal or foraminal stenosis. IMPRESSION: 1. Left paracentral disc protrusion at L5-S1,  abutting the transiting left S1 nerve root in the left lateral recess. 2. 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 These disc protrusions could both potentially result in left lower extremity radicular symptoms. 3. Mild facet arthrosis at L4-5 and L5-S1. Electronically Signed   By: Jeannine Boga M.D.   On: 12/12/2014 23:01   I have personally reviewed and evaluated these images as part of my medical decision-making.   EKG Interpretation None      MDM   Final diagnoses:  None    1. Low back pain  The patient's pain on initial exam was out of proportion to muscle strain/sciatica pain. Questionable foot drop when walking. Unable to sit down. She reported urinary incontinence for the past 4 days  as well "because of the pain". Will get MRI secondary to presence of neurologic abnormalities.  Re-evaluation after treatment with pain medication: she is much more comfortable. Ambulatory without ataxia, without foot drop. MRI pending.   MRI showing no cord compromise. She is felt appropriate for discharge home.   I personally performed the services described in this documentation, which was scribed in my presence. The recorded information has been reviewed and is accurate.     Charlann Lange, PA-C 12/15/14 2240  Harvel Quale, MD 12/16/14 [phone]

## 2014-12-12 NOTE — Discharge Instructions (Signed)

## 2015-03-26 ENCOUNTER — Ambulatory Visit (INDEPENDENT_AMBULATORY_CARE_PROVIDER_SITE_OTHER): Payer: No Typology Code available for payment source | Admitting: Physician Assistant

## 2015-03-26 ENCOUNTER — Encounter: Payer: Self-pay | Admitting: Physician Assistant

## 2015-03-26 VITALS — BP 128/78 | HR 80 | Temp 98.9°F | Resp 18 | Wt 149.0 lb

## 2015-03-26 DIAGNOSIS — M797 Fibromyalgia: Secondary | ICD-10-CM | POA: Diagnosis not present

## 2015-03-26 DIAGNOSIS — Z5181 Encounter for therapeutic drug level monitoring: Secondary | ICD-10-CM | POA: Diagnosis not present

## 2015-03-26 DIAGNOSIS — G43909 Migraine, unspecified, not intractable, without status migrainosus: Secondary | ICD-10-CM

## 2015-03-26 MED ORDER — TOPIRAMATE 50 MG PO TABS: 50.0000 mg | ORAL_TABLET | Freq: Every day | ORAL | Status: DC

## 2015-03-26 MED ORDER — PREGABALIN 150 MG PO CAPS: ORAL_CAPSULE | ORAL | Status: DC

## 2015-03-26 MED ORDER — DULOXETINE HCL 60 MG PO CPEP: 60.0000 mg | ORAL_CAPSULE | Freq: Every day | ORAL | Status: DC

## 2015-03-26 MED ORDER — CARISOPRODOL 350 MG PO TABS: ORAL_TABLET | ORAL | Status: DC

## 2015-03-26 NOTE — Progress Notes (Signed)
Patient ID: TEKELIA JAGGER MRN: [id number], DOB: Jun 27, 1970, 45 y.o. Date of Encounter: 03/26/2015, 4:48 PM    Chief Complaint:  Chief Complaint  Patient presents with  . check up    been out of town needs refills     HPI: 45 y.o. year old white female here for f/u OV.  She had been seeing Dr. Dennard Schaumann here--reviewede that her last visit with him was in 2015. She says that she had gone to [place] and ended up having to stay there longer than she had planned. Says then she came back here in June 2016 but then had problems with her insurance card. Says that she had decreased the frequency of taking her medicines-- to try to make them last. Is here to get refills on medicines. I asked if she was seeing any other specialist/any other providers that were prescribing any of these medications and which medications were being prescribed here by Dr. Dennard Schaumann.  She states that she was seeing another provider that was prescribing the sumatriptan and Depakote and Robaxin.  Says that the other medications were being prescribed here with Dr. Dennard Schaumann.  Regarding the Cymbalta she says that she was on this for the fibromyalgia ( not for anxiety, depression, etc per pt today).  Today I reviewed with her that she has 3 different muscle relaxers on her medication list.  Reviewed that if she took more than one of these, this would be overdosing on the same type of medication.  Told her that we need to pick the one out of these that is most effective for her and discontinue the others.  She says that the Manuela Neptune gives her the most relief. Therefore will prescribe the Soma today and removed Robaxin and Flexeril from her medicine list.  Also reviewed that she should not be taking tramadol with Cymbalta. Therefore also removed tramadol from medication list.     Home Meds:   Outpatient Prescriptions Prior to Visit  Medication Sig Dispense Refill  . divalproex (DEPAKOTE) 500 MG DR tablet Take 1  tablet (500 mg total) by mouth at bedtime. 2 tab po qhs (Patient taking differently: Take 1,000 mg by mouth at bedtime. ) 60 tablet 2  . naproxen (NAPROSYN) 375 MG tablet Take 375 mg by mouth 2 (two) times daily with a meal.    . oxyCODONE-acetaminophen (PERCOCET/ROXICET) 5-325 MG tablet Take 1-2 tablets by mouth every 4 (four) hours as needed for severe pain. 8 tablet 0  . SUMAtriptan (IMITREX) 100 MG tablet TAKE 1 TABLET BY MOUTH EVERY 2 HOURS AS NEEDED FOR MIGRAINE. 10 tablet 0  . carisoprodol (SOMA) 350 MG tablet TAKE 1 TABLET BY MOUTH 4 TIMES A DAY AS NEEDED FOR MUSCLE SPASM 30 tablet 0  . cyclobenzaprine (FLEXERIL) 10 MG tablet Take 10 mg by mouth 3 (three) times daily as needed for muscle spasms.    . DULoxetine (CYMBALTA) 60 MG capsule Take 1 capsule (60 mg total) by mouth daily. 30 capsule 5  . methocarbamol (ROBAXIN) 500 MG tablet Take 1 tablet (500 mg total) by mouth 2 (two) times daily. 20 tablet 0  . pregabalin (LYRICA) 150 MG capsule TAKE ONE CAPSULE BY MOUTH TWICE A DAY (Patient taking differently: Take 150 mg by mouth 2 (two) times daily. ) 60 capsule 5  . topiramate (TOPAMAX) 50 MG tablet Take 1 tablet (50 mg total) by mouth daily. 2 tabs po qhs (Patient taking differently: Take 50 mg by mouth daily. ) 60 tablet 5  .  traMADol (ULTRAM) 50 MG tablet Take 1 tablet (50 mg total) by mouth every 6 (six) hours as needed. 15 tablet 0  . predniSONE (DELTASONE) 10 MG tablet Take 2 tablets (20 mg total) by mouth daily. 21 tablet 0   No facility-administered medications prior to visit.    Allergies:  Allergies  Allergen Reactions  . Vicodin [Hydrocodone-Acetaminophen] Other (See Comments)    Makes her feel like she cannot breath, but no throat swelling      Review of Systems: See HPI for pertinent ROS. All other ROS negative.    Physical Exam: Blood pressure 128/78, pulse 80, temperature 98.9 F (37.2 C), temperature source Oral, resp. rate 18, weight 149 lb (67.586 kg)., Body mass  index is 24.06 kg/(m^2). General:  WNWD WF. Appears in no acute distress. Neck: Supple. No thyromegaly. No lymphadenopathy. Lungs: Clear bilaterally to auscultation without wheezes, rales, or rhonchi. Breathing is unlabored. Heart: Regular rhythm. No murmurs, rubs, or gallops. Msk:  Strength and tone normal for age. Extremities/Skin: Warm and dry. Neuro: Alert and oriented X 3. Moves all extremities spontaneously. Gait is normal. CNII-XII grossly in tact. Psych:  Responds to questions appropriately with a normal affect.     ASSESSMENT AND PLAN:  45 y.o. year old female with  1. Fibromyalgia - carisoprodol (SOMA) 350 MG tablet; TAKE 1 TABLET BY MOUTH 4 TIMES A DAY AS NEEDED FOR MUSCLE SPASM  Dispense: 60 tablet; Refill: 1 - DULoxetine (CYMBALTA) 60 MG capsule; Take 1 capsule (60 mg total) by mouth daily.  Dispense: 30 capsule; Refill: 5 - pregabalin (LYRICA) 150 MG capsule; TAKE ONE CAPSULE BY MOUTH TWICE A DAY  Dispense: 60 capsule; Refill: 5 - COMPLETE METABOLIC PANEL WITH GFR  2. Migraine without status migrainosus, not intractable, unspecified migraine type - topiramate (TOPAMAX) 50 MG tablet; Take 1 tablet (50 mg total) by mouth daily. 2 every night.  Dispense: 90 tablet; Refill: 2 - COMPLETE METABOLIC PANEL WITH GFR  3. Medication monitoring encounter - COMPLETE METABOLIC PANEL WITH GFR  She is to schedule follow-up office visit here in 3 months to reestablish with Dr. Dennard Schaumann and further follow-up further medical care.  Marin Olp [place], [place], [facility] 03/26/2015 4:48 PM

## 2015-03-27 ENCOUNTER — Encounter: Payer: Self-pay | Admitting: Family Medicine

## 2015-03-27 LAB — COMPLETE METABOLIC PANEL WITH GFR
ALT: 11 U/L (ref 6–29)
AST: 14 U/L (ref 10–30)
Albumin: 4 g/dL (ref 3.6–5.1)
Alkaline Phosphatase: 40 U/L (ref 33–115)
BILIRUBIN TOTAL: 0.4 mg/dL (ref 0.2–1.2)
BUN: 11 mg/dL (ref 7–25)
CO2: 22 mmol/L (ref 20–31)
CREATININE: 0.62 mg/dL (ref 0.50–1.10)
Calcium: 9.1 mg/dL (ref 8.6–10.2)
Chloride: 105 mmol/L (ref 98–110)
GFR, Est Non African American: >89 mL/min (ref >=60)
GLUCOSE: 79 mg/dL (ref 70–99)
Potassium: 4.2 mmol/L (ref 3.5–5.3)
SODIUM: 136 mmol/L (ref 135–146)
TOTAL PROTEIN: 6.6 g/dL (ref 6.1–8.1)

## 2015-03-31 ENCOUNTER — Telehealth: Payer: Self-pay | Admitting: Family Medicine

## 2015-03-31 NOTE — Telephone Encounter (Signed)
rec'd approval for Lyrica until 03/30/2016 per insurance coverage guidelines.  [id number].  Pharmacy made aware.

## 2015-03-31 NOTE — Telephone Encounter (Signed)
PA requested for Lyrica.  Submitted thru [facility]  [id number].  Awaiting response.

## 2015-04-06 ENCOUNTER — Emergency Department (HOSPITAL_COMMUNITY)
Admission: EM | Admit: 2015-04-06 | Discharge: 2015-04-06 | Disposition: A | Discharge status: Home / Self Care | Payer: No Typology Code available for payment source | Attending: Emergency Medicine | Admitting: Emergency Medicine

## 2015-04-06 ENCOUNTER — Encounter (HOSPITAL_COMMUNITY): Payer: Self-pay

## 2015-04-06 DIAGNOSIS — Z791 Long term (current) use of non-steroidal anti-inflammatories (NSAID): Secondary | ICD-10-CM | POA: Insufficient documentation

## 2015-04-06 DIAGNOSIS — G8929 Other chronic pain: Secondary | ICD-10-CM

## 2015-04-06 DIAGNOSIS — Z862 Personal history of diseases of the blood and blood-forming organs and certain disorders involving the immune mechanism: Secondary | ICD-10-CM | POA: Insufficient documentation

## 2015-04-06 DIAGNOSIS — Z8679 Personal history of other diseases of the circulatory system: Secondary | ICD-10-CM | POA: Diagnosis not present

## 2015-04-06 DIAGNOSIS — M5442 Lumbago with sciatica, left side: Secondary | ICD-10-CM | POA: Diagnosis not present

## 2015-04-06 DIAGNOSIS — G43909 Migraine, unspecified, not intractable, without status migrainosus: Secondary | ICD-10-CM | POA: Diagnosis not present

## 2015-04-06 DIAGNOSIS — F17210 Nicotine dependence, cigarettes, uncomplicated: Secondary | ICD-10-CM | POA: Insufficient documentation

## 2015-04-06 DIAGNOSIS — M545 Low back pain: Secondary | ICD-10-CM | POA: Diagnosis present

## 2015-04-06 DIAGNOSIS — Z7952 Long term (current) use of systemic steroids: Secondary | ICD-10-CM | POA: Diagnosis not present

## 2015-04-06 DIAGNOSIS — Z79899 Other long term (current) drug therapy: Secondary | ICD-10-CM | POA: Diagnosis not present

## 2015-04-06 MED ORDER — OXYCODONE-ACETAMINOPHEN 5-325 MG PO TABS
2.0000 | ORAL_TABLET | Freq: Once | ORAL | Status: AC
  Administered 2015-04-06: 2 via ORAL
  Filled 2015-04-06: qty 2

## 2015-04-06 MED ORDER — IBUPROFEN 800 MG PO TABS: 800.0000 mg | ORAL_TABLET | Freq: Three times a day (TID) | ORAL | Status: DC

## 2015-04-06 MED ORDER — PREDNISONE 20 MG PO TABS: ORAL_TABLET | ORAL | Status: DC

## 2015-04-06 MED ORDER — METHOCARBAMOL 500 MG PO TABS: 500.0000 mg | ORAL_TABLET | Freq: Two times a day (BID) | ORAL | Status: DC

## 2015-04-06 NOTE — ED Provider Notes (Signed)
CSN: [id number]     Arrival date & time 04/06/15  1403 History   By signing my name below, I, Evelene Croon, attest that this documentation has been prepared under the direction and in the presence of non-physician practitioner, Abigail Butts, PA-C. Electronically Signed: Evelene Croon, Scribe. 04/06/2015. 3:17 PM.   Chief Complaint  Patient presents with  . Back Pain    The history is provided by the patient. No language interpreter was used.    HPI Comments:  Cassie Lynn is a 45 y.o. female with a history of chronic back pain, who presents to the Emergency Department complaining of increased lower back pain x a few months, worse this AM. She reports associated left hip pain. Pt has been evaluated by PCP for her pain and was placed on prednisone and percocet but notes she has not taken these meds in ~ 3 months. She denies recent heavy lifting, and recent fall/acute injury. No alleviating factors noted.  Past Medical History  Diagnosis Date  . Anemia   . Aneurysm ([place])   . Fibromyalgia   . Migraine    Past Surgical History  Procedure Laterality Date  . Cholecystectomy    . Cesarean section     History reviewed. No pertinent family history. Social History  Substance Use Topics  . Smoking status: Current Every Day Smoker -- 1.00 packs/day    Types: Cigarettes  . Smokeless tobacco: None  . Alcohol Use: Yes     Comment: occ    OB History    Gravida Para Term Preterm AB TAB SAB Ectopic Multiple Living   4 3 3  1  1   3      Review of Systems  Constitutional: Negative for fever and chills.  Respiratory: Negative for shortness of breath.   Cardiovascular: Negative for chest pain.  Genitourinary: Negative for hematuria and flank pain.  Musculoskeletal: Positive for back pain.  Neurological: Negative for weakness.    Allergies  Vicodin  Home Medications   Prior to Admission medications   Medication Sig Start Date End Date Taking? Authorizing Provider   carisoprodol (SOMA) 350 MG tablet TAKE 1 TABLET BY MOUTH 4 TIMES A DAY AS NEEDED FOR MUSCLE SPASM 03/26/15   Orlena Sheldon, PA-C  divalproex (DEPAKOTE) 500 MG DR tablet Take 1 tablet (500 mg total) by mouth at bedtime. 2 tab po qhs Patient taking differently: Take 1,000 mg by mouth at bedtime.  04/13/13   Susy Frizzle, MD  DULoxetine (CYMBALTA) 60 MG capsule Take 1 capsule (60 mg total) by mouth daily. 03/26/15   Lonie Peak Dixon, PA-C  ibuprofen (ADVIL,MOTRIN) 800 MG tablet Take 1 tablet (800 mg total) by mouth 3 (three) times daily. 04/06/15   Aidee Latimore, PA-C  methocarbamol (ROBAXIN) 500 MG tablet Take 1 tablet (500 mg total) by mouth 2 (two) times daily. 04/06/15   Leslieanne Cobarrubias, PA-C  naproxen (NAPROSYN) 375 MG tablet Take 375 mg by mouth 2 (two) times daily with a meal.    Historical Provider, MD  oxyCODONE-acetaminophen (PERCOCET/ROXICET) 5-325 MG tablet Take 1-2 tablets by mouth every 4 (four) hours as needed for severe pain. 12/12/14   Charlann Lange, PA-C  predniSONE (DELTASONE) 20 MG tablet 3 tabs po daily x 3 days, then 2 tabs x 3 days, then 1.5 tabs x 3 days, then 1 tab x 3 days, then 0.5 tabs x 3 days 04/06/15   Jarrett Soho Sola Margolis, PA-C  pregabalin (LYRICA) 150 MG capsule TAKE ONE CAPSULE BY MOUTH  TWICE A DAY 03/26/15   Orlena Sheldon, PA-C  SUMAtriptan (IMITREX) 100 MG tablet TAKE 1 TABLET BY MOUTH EVERY 2 HOURS AS NEEDED FOR MIGRAINE.    Susy Frizzle, MD  topiramate (TOPAMAX) 50 MG tablet Take 1 tablet (50 mg total) by mouth daily. 2 every night. 03/26/15   Lonie Peak Dixon, PA-C   BP 127/82 mmHg  Pulse 104  Temp(Src) 98.4 F (36.9 C) (Oral)  Resp 16  Ht 5\' 4"  (1.626 m)  Wt 68.176 kg  BMI 25.79 kg/m2  SpO2 98%  LMP 03/31/2015 Physical Exam  Constitutional: She appears well-developed and well-nourished. No distress.  HENT:  Head: Normocephalic and atraumatic.  Mouth/Throat: Oropharynx is clear and moist. No oropharyngeal exudate.  Eyes: Conjunctivae are normal.  Neck:  Normal range of motion. Neck supple.  Full ROM without pain  Cardiovascular: Normal rate, regular rhythm and intact distal pulses.   Pulmonary/Chest: Effort normal and breath sounds normal. No respiratory distress. She has no wheezes.  Abdominal: Soft. She exhibits no distension. There is no tenderness.  Musculoskeletal:  Full range of motion of the T-spine and L-spine No tenderness to palpation of the spinous processes of the T-spine or L-spine Midline left spine tenderness Left SI joint and paraspinal tenderness Radiation of pain to LLE is reproducible with palpation of left buttock  No tenderness with palpation of greater trochanter or left groin  Lymphadenopathy:    She has no cervical adenopathy.  Neurological: She is alert. She has normal reflexes.  Reflex Scores:      Bicep reflexes are 2+ on the right side and 2+ on the left side.      Brachioradialis reflexes are 2+ on the right side and 2+ on the left side.      Patellar reflexes are 2+ on the right side and 2+ on the left side.      Achilles reflexes are 2+ on the right side and 2+ on the left side. Speech is clear and goal oriented, follows commands Normal 5/5 strength in upper and lower extremities bilaterally including dorsiflexion and plantar flexion, strong and equal grip strength Sensation normal to light and sharp touch Moves extremities without ataxia, coordination intact Normal gait Normal balance No Clonus   Skin: Skin is warm and dry. No rash noted. She is not diaphoretic. No erythema.  Psychiatric: She has a normal mood and affect. Her behavior is normal.  Nursing note and vitals reviewed.   ED Course  Procedures   DIAGNOSTIC STUDIES:  Oxygen Saturation is 98% on RA, normal by my interpretation.    COORDINATION OF CARE:  2:53 PM Will discharge with Rx for prednisone, muscle relaxer,  and an anti-inflammatory.  Discussed treatment plan with pt at bedside and pt agreed to plan.     MDM   Final  diagnoses:  Midline low back pain with left-sided sciatica  Acute exacerbation of chronic low back pain   Referenced narcotics database. Pt has Rx for oxycodone from 11/2014 and soma from 03/27/15 for which she was given 60 tablets.   Patient with back pain.  No neurological deficits and normal neuro exam.  Patient is ambulatory.  No loss of bowel or bladder control.  No concern for cauda equina.  No fever, night sweats, weight loss, h/o cancer, IVDA, no recent procedure to back. No urinary symptoms suggestive of UTI.  Supportive care and return precaution discussed. Pt advised to follow up with PCP. Appears safe for discharge at this time.  Pt  d/c home with ibuprofen, robaxin and prednisone.    I personally performed the services described in this documentation, which was scribed in my presence. The recorded information has been reviewed and is accurate.   Jarrett Soho Nahjae Hoeg, PA-C 04/06/15 1517  Tanna Furry, MD 04/15/15 [phone]

## 2015-04-06 NOTE — Discharge Instructions (Signed)
1. Medications: robaxin, ibuprofen, prednisone, usual home medications 2. Treatment: rest, drink plenty of fluids, gentle stretching as discussed, alternate ice and heat 3. Follow Up: Please followup with your primary doctor in 3 days for discussion of your diagnoses and further evaluation after today's visit; if you do not have a primary care doctor use the resource guide provided to find one;  Return to the ER for worsening back pain, difficulty walking, loss of bowel or bladder control or other concerning symptoms

## 2015-04-06 NOTE — ED Notes (Signed)
Pt has chronic lower left back and left hip pain.  Out of meds x 2 weeks.  Pt woke up this morning with severe pain.  Difficult to sit and bend over.

## 2015-04-06 NOTE — ED Notes (Signed)
Declined W/C at D/C and was escorted to lobby by RN. 

## 2015-04-07 ENCOUNTER — Ambulatory Visit (INDEPENDENT_AMBULATORY_CARE_PROVIDER_SITE_OTHER): Payer: No Typology Code available for payment source | Admitting: Family Medicine

## 2015-04-07 ENCOUNTER — Encounter: Payer: Self-pay | Admitting: Family Medicine

## 2015-04-07 VITALS — BP 118/56 | HR 80 | Temp 98.7°F | Resp 18 | Ht 66.0 in | Wt 150.0 lb

## 2015-04-07 DIAGNOSIS — M5432 Sciatica, left side: Secondary | ICD-10-CM | POA: Diagnosis not present

## 2015-04-07 MED ORDER — OXYCODONE-ACETAMINOPHEN 10-325 MG PO TABS: 1.0000 | ORAL_TABLET | Freq: Four times a day (QID) | ORAL | Status: DC | PRN

## 2015-04-07 NOTE — Progress Notes (Signed)
Subjective:    Patient ID: Cassie Lynn, female    DOB: 01-Jun-1970, 45 y.o.   MRN: [id number]  HPI  Patient was seen in the emergency room in October and again recently for low back pain radiating down her left leg into her left gluteus down the posterior aspect of her left leg into her left foot. She rates the pain as 9 on a scale of 10. She reports numbness and tingling in the left leg. She is unable to sit down due to the severity of the pain. She also reports some muscle weakness in her left leg. She had an MRI of her lumbar spine obtained in October which revealed: 1. Left paracentral disc protrusion at L5-S1, abutting the transiting left S1 nerve root in the left lateral recess. 2. 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 These disc protrusions could both potentially result in left lower extremity radicular symptoms. 3. Mild facet arthrosis at L4-5 and L5-S1. She was seen in the emergency room last night and was started on a prednisone taper pack. She continues to complain of pain on 9 on a scale of 10. Past Medical History  Diagnosis Date  . Anemia   . Aneurysm ([place])   . Fibromyalgia   . Migraine    Past Surgical History  Procedure Laterality Date  . Cholecystectomy    . Cesarean section     Current Outpatient Prescriptions on File Prior to Visit  Medication Sig Dispense Refill  . carisoprodol (SOMA) 350 MG tablet TAKE 1 TABLET BY MOUTH 4 TIMES A DAY AS NEEDED FOR MUSCLE SPASM 60 tablet 1  . divalproex (DEPAKOTE) 500 MG DR tablet Take 1 tablet (500 mg total) by mouth at bedtime. 2 tab po qhs (Patient taking differently: Take 1,000 mg by mouth at bedtime. ) 60 tablet 2  . DULoxetine (CYMBALTA) 60 MG capsule Take 1 capsule (60 mg total) by mouth daily. 30 capsule 5  . ibuprofen (ADVIL,MOTRIN) 800 MG tablet Take 1 tablet  (800 mg total) by mouth 3 (three) times daily. 21 tablet 0  . methocarbamol (ROBAXIN) 500 MG tablet Take 1 tablet (500 mg total) by mouth 2 (two) times daily. 20 tablet 0  . SUMAtriptan (IMITREX) 100 MG tablet TAKE 1 TABLET BY MOUTH EVERY 2 HOURS AS NEEDED FOR MIGRAINE. 10 tablet 0  . topiramate (TOPAMAX) 50 MG tablet Take 1 tablet (50 mg total) by mouth daily. 2 every night. 90 tablet 2  . pregabalin (LYRICA) 150 MG capsule TAKE ONE CAPSULE BY MOUTH TWICE A DAY (Patient not taking: Reported on 04/07/2015) 60 capsule 5   No current facility-administered medications on file prior to visit.   Allergies  Allergen Reactions  . Vicodin [Hydrocodone-Acetaminophen] Other (See Comments)    Makes her feel like she cannot breath, but no throat swelling   Social History   Social History  . Marital Status: Single    Spouse Name: N/A  . Number of Children: N/A  . Years of Education: N/A   Occupational History  . Not on file.   Social History Main Topics  . Smoking status: Current Every Day Smoker -- 1.00 packs/day    Types: Cigarettes  . Smokeless tobacco: Not on file  . Alcohol Use: Yes     Comment: occ   . Drug Use: No  . Sexual Activity: Not on file  Other Topics Concern  . Not on file   Social History Narrative     Review of Systems  All other systems reviewed and are negative.      Objective:   Physical Exam  Cardiovascular: Normal rate, regular rhythm and normal heart sounds.   Pulmonary/Chest: Effort normal and breath sounds normal. No respiratory distress. She has no wheezes. She has no rales.  Musculoskeletal:       Lumbar back: She exhibits decreased range of motion, tenderness and pain. She exhibits no bony tenderness.  Neurological: She has normal reflexes. She displays normal reflexes. She exhibits abnormal muscle tone. Coordination normal.  Vitals reviewed.         Assessment & Plan:  Left sided sciatica - Plan: oxyCODONE-acetaminophen (PERCOCET) 10-325 MG  tablet, Ambulatory referral to Interventional Radiology  Continue the prednisone given to her in the emergency room. I did give the patient prescription for Percocet 10/325 one by mouth every 6 hours when necessary pain. I will also consult interventional radiology for possible epidural steroid injection at L5-S1 where I believe the majority of her pain is located.

## 2015-04-09 ENCOUNTER — Other Ambulatory Visit: Payer: Self-pay | Admitting: Family Medicine

## 2015-04-09 DIAGNOSIS — M5432 Sciatica, left side: Secondary | ICD-10-CM

## 2015-04-10 ENCOUNTER — Other Ambulatory Visit: Payer: Self-pay | Admitting: Family Medicine

## 2015-04-10 ENCOUNTER — Ambulatory Visit
Admission: RE | Admit: 2015-04-10 | Discharge: 2015-04-10 | Disposition: A | Discharge status: Home / Self Care | Payer: No Typology Code available for payment source | Source: Ambulatory Visit | Attending: Family Medicine | Admitting: Family Medicine

## 2015-04-10 DIAGNOSIS — M5432 Sciatica, left side: Secondary | ICD-10-CM

## 2015-04-10 MED ORDER — IOHEXOL 180 MG/ML  SOLN
1.0000 mL | Freq: Once | INTRAMUSCULAR | Status: AC | PRN
  Administered 2015-04-10: 1 mL via EPIDURAL

## 2015-04-10 MED ORDER — METHYLPREDNISOLONE ACETATE 40 MG/ML INJ SUSP (RADIOLOG
120.0000 mg | Freq: Once | INTRAMUSCULAR | Status: AC
  Administered 2015-04-10: 120 mg via EPIDURAL

## 2015-04-10 NOTE — Discharge Instructions (Signed)

## 2015-04-11 ENCOUNTER — Telehealth: Payer: Self-pay | Admitting: Family Medicine

## 2015-04-11 DIAGNOSIS — M5126 Other intervertebral disc displacement, lumbar region: Secondary | ICD-10-CM

## 2015-04-11 NOTE — Telephone Encounter (Signed)
Pt aware and referral placed.  

## 2015-04-11 NOTE — Telephone Encounter (Signed)
PATIENT HAD INJECTIONS IN HER BACK YESTERDAY, AND IS STILL IN PAIN, WOULD LIKE TO KNOW WHAT THE NEXT STEP IS FOR HER PAIN  [phone] (M)

## 2015-04-11 NOTE — Telephone Encounter (Signed)
Recommend neurosurgery referral.

## 2015-04-15 ENCOUNTER — Emergency Department (HOSPITAL_COMMUNITY)
Admission: EM | Admit: 2015-04-15 | Discharge: 2015-04-15 | Disposition: A | Discharge status: Home / Self Care | Payer: No Typology Code available for payment source | Attending: Emergency Medicine | Admitting: Emergency Medicine

## 2015-04-15 ENCOUNTER — Encounter (HOSPITAL_COMMUNITY): Payer: Self-pay | Admitting: *Deleted

## 2015-04-15 DIAGNOSIS — M5416 Radiculopathy, lumbar region: Secondary | ICD-10-CM

## 2015-04-15 DIAGNOSIS — Z791 Long term (current) use of non-steroidal anti-inflammatories (NSAID): Secondary | ICD-10-CM | POA: Diagnosis not present

## 2015-04-15 DIAGNOSIS — Z79899 Other long term (current) drug therapy: Secondary | ICD-10-CM | POA: Insufficient documentation

## 2015-04-15 DIAGNOSIS — F17210 Nicotine dependence, cigarettes, uncomplicated: Secondary | ICD-10-CM | POA: Diagnosis not present

## 2015-04-15 DIAGNOSIS — Z8679 Personal history of other diseases of the circulatory system: Secondary | ICD-10-CM | POA: Insufficient documentation

## 2015-04-15 DIAGNOSIS — M797 Fibromyalgia: Secondary | ICD-10-CM | POA: Insufficient documentation

## 2015-04-15 DIAGNOSIS — Z862 Personal history of diseases of the blood and blood-forming organs and certain disorders involving the immune mechanism: Secondary | ICD-10-CM | POA: Insufficient documentation

## 2015-04-15 DIAGNOSIS — G43909 Migraine, unspecified, not intractable, without status migrainosus: Secondary | ICD-10-CM | POA: Diagnosis not present

## 2015-04-15 DIAGNOSIS — M545 Low back pain: Secondary | ICD-10-CM | POA: Diagnosis present

## 2015-04-15 MED ORDER — KETOROLAC TROMETHAMINE 30 MG/ML IJ SOLN
30.0000 mg | Freq: Once | INTRAMUSCULAR | Status: AC
  Administered 2015-04-15: 30 mg via INTRAMUSCULAR
  Filled 2015-04-15: qty 1

## 2015-04-15 MED ORDER — OXYCODONE-ACETAMINOPHEN 5-325 MG PO TABS
ORAL_TABLET | ORAL | Status: AC
  Filled 2015-04-15: qty 1

## 2015-04-15 MED ORDER — DIAZEPAM 5 MG PO TABS: 5.0000 mg | ORAL_TABLET | Freq: Three times a day (TID) | ORAL | Status: DC | PRN

## 2015-04-15 MED ORDER — GABAPENTIN 100 MG PO CAPS
200.0000 mg | ORAL_CAPSULE | Freq: Once | ORAL | Status: DC
  Filled 2015-04-15: qty 2

## 2015-04-15 MED ORDER — HYDROMORPHONE HCL 1 MG/ML IJ SOLN
1.0000 mg | Freq: Once | INTRAMUSCULAR | Status: AC
  Administered 2015-04-15: 1 mg via INTRAMUSCULAR
  Filled 2015-04-15: qty 1

## 2015-04-15 MED ORDER — OXYCODONE-ACETAMINOPHEN 5-325 MG PO TABS
1.0000 | ORAL_TABLET | Freq: Once | ORAL | Status: AC
  Administered 2015-04-15: 1 via ORAL

## 2015-04-15 MED ORDER — KETAMINE HCL 50 MG/ML IJ SOLN
30.0000 mg | Freq: Once | INTRAMUSCULAR | Status: AC
  Administered 2015-04-15: 30 mg via INTRAMUSCULAR

## 2015-04-15 MED ORDER — DIAZEPAM 5 MG PO TABS
5.0000 mg | ORAL_TABLET | Freq: Once | ORAL | Status: AC
  Administered 2015-04-15: 5 mg via ORAL
  Filled 2015-04-15: qty 1

## 2015-04-15 NOTE — ED Provider Notes (Signed)
CSN: [id number]     Arrival date & time 04/15/15  1000 History   First MD Initiated Contact with Patient 04/15/15 1821     Chief Complaint  Patient presents with  . Back Pain     (Consider location/radiation/quality/duration/timing/severity/associated sxs/prior Treatment) HPI   45 year old female with lower back pain. Ongoing for months. Progressively worsening. Radiation into her left lower extremity. Exacerbated by any type of movement. Denies any numbness, tingling or focal loss of strength. To me, she denies any bladder or bowel incontinence. She has been taking various pain medications with only mild improvement. Recent injection as well which has not helped much in terms her symptoms. Denies any use of any blood thinners. Denies IV drug use. Denies prior back surgery.  Past Medical History  Diagnosis Date  . Anemia   . Aneurysm ([place])   . Fibromyalgia   . Migraine    Past Surgical History  Procedure Laterality Date  . Cholecystectomy    . Cesarean section     No family history on file. Social History  Substance Use Topics  . Smoking status: Current Every Day Smoker -- 1.00 packs/day    Types: Cigarettes  . Smokeless tobacco: None  . Alcohol Use: Yes     Comment: occ    OB History    Gravida Para Term Preterm AB TAB SAB Ectopic Multiple Living   4 3 3  1  1   3      Review of Systems  All systems reviewed and negative, other than as noted in HPI.   Allergies  Vicodin  Home Medications   Prior to Admission medications   Medication Sig Start Date End Date Taking? Authorizing Provider  carisoprodol (SOMA) 350 MG tablet TAKE 1 TABLET BY MOUTH 4 TIMES A DAY AS NEEDED FOR MUSCLE SPASM 03/26/15  Yes Orlena Sheldon, PA-C  divalproex (DEPAKOTE) 500 MG DR tablet Take 1 tablet (500 mg total) by mouth at bedtime. 2 tab po qhs Patient taking differently: Take 1,000 mg by mouth at bedtime.  04/13/13  Yes Susy Frizzle, MD  DULoxetine (CYMBALTA) 60 MG capsule Take 1 capsule  (60 mg total) by mouth daily. 03/26/15  Yes Mary B Dixon, PA-C  ibuprofen (ADVIL,MOTRIN) 800 MG tablet Take 1 tablet (800 mg total) by mouth 3 (three) times daily. 04/06/15  Yes Hannah Muthersbaugh, PA-C  methocarbamol (ROBAXIN) 500 MG tablet Take 1 tablet (500 mg total) by mouth 2 (two) times daily. 04/06/15  Yes Hannah Muthersbaugh, PA-C  oxyCODONE-acetaminophen (PERCOCET) 10-325 MG tablet Take 1 tablet by mouth every 6 (six) hours as needed for pain. 04/07/15  Yes Susy Frizzle, MD  pregabalin (LYRICA) 150 MG capsule TAKE ONE CAPSULE BY MOUTH TWICE A DAY 03/26/15  Yes Orlena Sheldon, PA-C  SUMAtriptan (IMITREX) 100 MG tablet TAKE 1 TABLET BY MOUTH EVERY 2 HOURS AS NEEDED FOR MIGRAINE.   Yes Susy Frizzle, MD  topiramate (TOPAMAX) 50 MG tablet Take 1 tablet (50 mg total) by mouth daily. 2 every night. 03/26/15  Yes Mary B Dixon, PA-C  diazepam (VALIUM) 5 MG tablet Take 1 tablet (5 mg total) by mouth every 8 (eight) hours as needed for muscle spasms. 04/15/15   Virgel Manifold, MD   BP 124/72 mmHg  Pulse 52  Temp(Src) 98 F (36.7 C) (Oral)  Resp 16  Ht 5\' 4"  (1.626 m)  Wt 150 lb (68.04 kg)  BMI 25.73 kg/m2  SpO2 99%  LMP 03/31/2015 Physical Exam  Constitutional: She  appears well-developed and well-nourished. No distress.  HENT:  Head: Normocephalic and atraumatic.  Eyes: Conjunctivae are normal. Right eye exhibits no discharge. Left eye exhibits no discharge.  Neck: Neck supple.  Cardiovascular: Normal rate, regular rhythm and normal heart sounds.  Exam reveals no gallop and no friction rub.   No murmur heard. Pulmonary/Chest: Effort normal and breath sounds normal. No respiratory distress.  Abdominal: Soft. She exhibits no distension. There is no tenderness.  Musculoskeletal: She exhibits no edema or tenderness.  Positive straight leg test. Range of motion her left lower extremity is somewhat limited by pain. She does not appear to have any true focal motor deficits though. She is vascularly  intact. Sensation is intact to light touch.  Neurological: She is alert.  Skin: Skin is warm and dry.  Psychiatric: She has a normal mood and affect. Her behavior is normal. Thought content normal.  Nursing note and vitals reviewed.   ED Course  Procedures (including critical care time) Labs Review Labs Reviewed - No data to display  Imaging Review No results found. I have personally reviewed and evaluated these images and lab results as part of my medical decision-making.   EKG Interpretation None      MDM   Final diagnoses:  Lumbar radiculopathy    45 year old female with left lower back pain with radicular symptoms into her left lower extremity. Her neuro exam is nonfocal. She is in the process of being evaluated by neurosurgery. She is treated symptomatically in the emergency room with some improvement. Valium added to her pain regimen. She's been prescribed various different pharmacological therapies over the last several weeks. She currently says that she is only taking 10 mg Percocets at this time. Precautions about the use of sedating medications. Return precautions were discussed. Outpatient follow-up otherwise with her PCP to discuss further and for possible neurosurgical consultation.  Virgel Manifold, MD 04/26/15 [phone]

## 2015-04-15 NOTE — ED Notes (Signed)
Pt reports back pain that has been ongoing. Pt has chronic lower back pain and had injections on Thursday. Pt states that she now has weakness and bladder incontinence.

## 2015-04-17 ENCOUNTER — Encounter (HOSPITAL_COMMUNITY): Payer: Self-pay | Admitting: *Deleted

## 2015-04-17 ENCOUNTER — Other Ambulatory Visit: Payer: Self-pay | Admitting: Neurological Surgery

## 2015-04-17 DIAGNOSIS — M5416 Radiculopathy, lumbar region: Secondary | ICD-10-CM | POA: Insufficient documentation

## 2015-04-17 MED ORDER — MUPIROCIN 2 % EX OINT
1.0000 "application " | TOPICAL_OINTMENT | Freq: Once | CUTANEOUS | Status: DC
  Administered 2015-04-18: 1 via TOPICAL
  Filled 2015-04-17: qty 22

## 2015-04-18 ENCOUNTER — Encounter (HOSPITAL_COMMUNITY): Payer: Self-pay

## 2015-04-18 ENCOUNTER — Ambulatory Visit (HOSPITAL_COMMUNITY): Payer: No Typology Code available for payment source | Admitting: Certified Registered Nurse Anesthetist

## 2015-04-18 ENCOUNTER — Ambulatory Visit (HOSPITAL_COMMUNITY)
Admission: RE | Admit: 2015-04-18 | Discharge: 2015-04-19 | Disposition: A | Discharge status: Home / Self Care | Payer: No Typology Code available for payment source | Source: Ambulatory Visit | Attending: Neurological Surgery | Admitting: Neurological Surgery

## 2015-04-18 ENCOUNTER — Encounter (HOSPITAL_COMMUNITY)
Admission: RE | Disposition: A | Discharge status: Home / Self Care | Payer: Self-pay | Source: Ambulatory Visit | Attending: Neurological Surgery

## 2015-04-18 ENCOUNTER — Ambulatory Visit (HOSPITAL_COMMUNITY): Payer: No Typology Code available for payment source

## 2015-04-18 DIAGNOSIS — Z6825 Body mass index (BMI) 25.0-25.9, adult: Secondary | ICD-10-CM | POA: Insufficient documentation

## 2015-04-18 DIAGNOSIS — M5127 Other intervertebral disc displacement, lumbosacral region: Secondary | ICD-10-CM | POA: Diagnosis present

## 2015-04-18 DIAGNOSIS — E669 Obesity, unspecified: Secondary | ICD-10-CM | POA: Insufficient documentation

## 2015-04-18 DIAGNOSIS — M4807 Spinal stenosis, lumbosacral region: Secondary | ICD-10-CM | POA: Insufficient documentation

## 2015-04-18 DIAGNOSIS — Z419 Encounter for procedure for purposes other than remedying health state, unspecified: Secondary | ICD-10-CM

## 2015-04-18 DIAGNOSIS — F17200 Nicotine dependence, unspecified, uncomplicated: Secondary | ICD-10-CM | POA: Insufficient documentation

## 2015-04-18 DIAGNOSIS — M5117 Intervertebral disc disorders with radiculopathy, lumbosacral region: Secondary | ICD-10-CM | POA: Insufficient documentation

## 2015-04-18 DIAGNOSIS — M5432 Sciatica, left side: Secondary | ICD-10-CM

## 2015-04-18 HISTORY — PX: LUMBAR LAMINECTOMY/DECOMPRESSION MICRODISCECTOMY: SHX5026

## 2015-04-18 HISTORY — DX: Reserved for inherently not codable concepts without codable children: IMO0001

## 2015-04-18 LAB — CBC
HEMATOCRIT: 36.8 % (ref 36.0–46.0)
Hemoglobin: 12 g/dL (ref 12.0–15.0)
MCH: 29.9 pg (ref 26.0–34.0)
MCHC: 32.6 g/dL (ref 30.0–36.0)
MCV: 91.8 fL (ref 78.0–100.0)
Platelets: 301 10*3/uL (ref 150–400)
RBC: 4.01 MIL/uL (ref 3.87–5.11)
RDW: 14.1 % (ref 11.5–15.5)
WBC: 12.7 10*3/uL — AB (ref 4.0–10.5)

## 2015-04-18 LAB — SURGICAL PCR SCREEN
MRSA, PCR: NEGATIVE
Staphylococcus aureus: NEGATIVE

## 2015-04-18 LAB — HCG, SERUM, QUALITATIVE: PREG SERUM: NEGATIVE

## 2015-04-18 SURGERY — LUMBAR LAMINECTOMY/DECOMPRESSION MICRODISCECTOMY 1 LEVEL
Anesthesia: General | Laterality: Left

## 2015-04-18 MED ORDER — ONDANSETRON HCL 4 MG/2ML IJ SOLN: 4.0000 mg | INTRAMUSCULAR | Status: DC | PRN

## 2015-04-18 MED ORDER — SODIUM CHLORIDE 0.9 % IR SOLN
Status: DC | PRN
  Administered 2015-04-18: 15:00:00

## 2015-04-18 MED ORDER — METHOCARBAMOL 500 MG PO TABS: 500.0000 mg | ORAL_TABLET | Freq: Four times a day (QID) | ORAL | Status: DC | PRN

## 2015-04-18 MED ORDER — PROPOFOL 10 MG/ML IV BOLUS
INTRAVENOUS | Status: AC
  Filled 2015-04-18: qty 20

## 2015-04-18 MED ORDER — PROPOFOL 10 MG/ML IV BOLUS
INTRAVENOUS | Status: DC | PRN
  Administered 2015-04-18: 150 mg via INTRAVENOUS

## 2015-04-18 MED ORDER — DOCUSATE SODIUM 100 MG PO CAPS
100.0000 mg | ORAL_CAPSULE | Freq: Two times a day (BID) | ORAL | Status: DC
  Administered 2015-04-18 – 2015-04-19 (×2): 100 mg via ORAL
  Filled 2015-04-18 (×2): qty 1

## 2015-04-18 MED ORDER — LIDOCAINE HCL (CARDIAC) 20 MG/ML IV SOLN
INTRAVENOUS | Status: DC | PRN
  Administered 2015-04-18: 40 mg via INTRAVENOUS

## 2015-04-18 MED ORDER — BISACODYL 5 MG PO TBEC: 5.0000 mg | DELAYED_RELEASE_TABLET | Freq: Every day | ORAL | Status: DC | PRN

## 2015-04-18 MED ORDER — MENTHOL 3 MG MT LOZG: 1.0000 | LOZENGE | OROMUCOSAL | Status: DC | PRN

## 2015-04-18 MED ORDER — SODIUM CHLORIDE 0.9 % IV SOLN: 250.0000 mL | INTRAVENOUS | Status: DC

## 2015-04-18 MED ORDER — HEMOSTATIC AGENTS (NO CHARGE) OPTIME
TOPICAL | Status: DC | PRN
  Administered 2015-04-18: 1 via TOPICAL

## 2015-04-18 MED ORDER — CEFAZOLIN SODIUM-DEXTROSE 2-3 GM-% IV SOLR
2.0000 g | INTRAVENOUS | Status: AC
  Administered 2015-04-18: 2 g via INTRAVENOUS
  Filled 2015-04-18: qty 50

## 2015-04-18 MED ORDER — ACETAMINOPHEN 325 MG PO TABS: 650.0000 mg | ORAL_TABLET | ORAL | Status: DC | PRN

## 2015-04-18 MED ORDER — BUPIVACAINE HCL (PF) 0.25 % IJ SOLN
INTRAMUSCULAR | Status: DC | PRN
Start: 2015-04-18 — End: 2015-04-18
  Administered 2015-04-18: 15 mL

## 2015-04-18 MED ORDER — FLEET ENEMA 7-19 GM/118ML RE ENEM: 1.0000 | ENEMA | Freq: Once | RECTAL | Status: DC | PRN

## 2015-04-18 MED ORDER — SODIUM CHLORIDE 0.9 % IV SOLN
INTRAVENOUS | Status: DC
  Administered 2015-04-18: 19:00:00 via INTRAVENOUS

## 2015-04-18 MED ORDER — CEFAZOLIN SODIUM 1-5 GM-% IV SOLN
1.0000 g | Freq: Three times a day (TID) | INTRAVENOUS | Status: AC
  Administered 2015-04-18 – 2015-04-19 (×2): 1 g via INTRAVENOUS
  Filled 2015-04-18 (×2): qty 50

## 2015-04-18 MED ORDER — SODIUM CHLORIDE 0.9% FLUSH: 3.0000 mL | Freq: Two times a day (BID) | INTRAVENOUS | Status: DC

## 2015-04-18 MED ORDER — FENTANYL CITRATE (PF) 250 MCG/5ML IJ SOLN
INTRAMUSCULAR | Status: DC | PRN
  Administered 2015-04-18 (×2): 50 ug via INTRAVENOUS
  Administered 2015-04-18 (×2): 100 ug via INTRAVENOUS

## 2015-04-18 MED ORDER — MIDAZOLAM HCL 2 MG/2ML IJ SOLN
INTRAMUSCULAR | Status: AC
  Filled 2015-04-18: qty 2

## 2015-04-18 MED ORDER — FENTANYL CITRATE (PF) 100 MCG/2ML IJ SOLN: 25.0000 ug | INTRAMUSCULAR | Status: DC | PRN

## 2015-04-18 MED ORDER — 0.9 % SODIUM CHLORIDE (POUR BTL) OPTIME
TOPICAL | Status: DC | PRN
  Administered 2015-04-18: 1000 mL

## 2015-04-18 MED ORDER — HYDROMORPHONE HCL 1 MG/ML IJ SOLN
0.5000 mg | INTRAMUSCULAR | Status: DC | PRN
  Administered 2015-04-18 – 2015-04-19 (×4): 1 mg via INTRAVENOUS
  Filled 2015-04-18 (×5): qty 1

## 2015-04-18 MED ORDER — PHENYLEPHRINE HCL 10 MG/ML IJ SOLN
INTRAMUSCULAR | Status: DC | PRN
  Administered 2015-04-18: 80 ug via INTRAVENOUS
  Administered 2015-04-18 (×2): 40 ug via INTRAVENOUS

## 2015-04-18 MED ORDER — ROCURONIUM BROMIDE 100 MG/10ML IV SOLN
INTRAVENOUS | Status: DC | PRN
  Administered 2015-04-18: 10 mg via INTRAVENOUS
  Administered 2015-04-18: 50 mg via INTRAVENOUS

## 2015-04-18 MED ORDER — FENTANYL CITRATE (PF) 250 MCG/5ML IJ SOLN
INTRAMUSCULAR | Status: AC
  Filled 2015-04-18: qty 5

## 2015-04-18 MED ORDER — PANTOPRAZOLE SODIUM 40 MG IV SOLR
40.0000 mg | Freq: Every day | INTRAVENOUS | Status: DC
  Administered 2015-04-18: 40 mg via INTRAVENOUS
  Filled 2015-04-18: qty 40

## 2015-04-18 MED ORDER — ONDANSETRON HCL 4 MG/2ML IJ SOLN
INTRAMUSCULAR | Status: DC | PRN
  Administered 2015-04-18: 4 mg via INTRAVENOUS

## 2015-04-18 MED ORDER — ACETAMINOPHEN 650 MG RE SUPP: 650.0000 mg | RECTAL | Status: DC | PRN

## 2015-04-18 MED ORDER — DEXTROSE 5 % IV SOLN
500.0000 mg | Freq: Four times a day (QID) | INTRAVENOUS | Status: DC | PRN
  Filled 2015-04-18: qty 5

## 2015-04-18 MED ORDER — PHENOL 1.4 % MT LIQD: 1.0000 | OROMUCOSAL | Status: DC | PRN

## 2015-04-18 MED ORDER — ONDANSETRON HCL 4 MG/2ML IJ SOLN: 4.0000 mg | Freq: Once | INTRAMUSCULAR | Status: DC | PRN

## 2015-04-18 MED ORDER — LIDOCAINE-EPINEPHRINE 1 %-1:100000 IJ SOLN
INTRAMUSCULAR | Status: DC | PRN
  Administered 2015-04-18: 15 mL

## 2015-04-18 MED ORDER — ROCURONIUM BROMIDE 50 MG/5ML IV SOLN
INTRAVENOUS | Status: AC
  Filled 2015-04-18: qty 1

## 2015-04-18 MED ORDER — SUGAMMADEX SODIUM 200 MG/2ML IV SOLN
INTRAVENOUS | Status: AC
  Filled 2015-04-18: qty 2

## 2015-04-18 MED ORDER — SENNA 8.6 MG PO TABS
1.0000 | ORAL_TABLET | Freq: Two times a day (BID) | ORAL | Status: DC
  Administered 2015-04-18 – 2015-04-19 (×2): 8.6 mg via ORAL
  Filled 2015-04-18 (×2): qty 1

## 2015-04-18 MED ORDER — THROMBIN 5000 UNITS EX SOLR
OROMUCOSAL | Status: DC | PRN
  Administered 2015-04-18: 15:00:00 via TOPICAL

## 2015-04-18 MED ORDER — MIDAZOLAM HCL 5 MG/5ML IJ SOLN
INTRAMUSCULAR | Status: DC | PRN
  Administered 2015-04-18: 2 mg via INTRAVENOUS

## 2015-04-18 MED ORDER — SUGAMMADEX SODIUM 200 MG/2ML IV SOLN
INTRAVENOUS | Status: DC | PRN
  Administered 2015-04-18: 200 mg via INTRAVENOUS

## 2015-04-18 MED ORDER — THROMBIN 5000 UNITS EX SOLR
CUTANEOUS | Status: DC | PRN
  Administered 2015-04-18: 5000 [IU] via TOPICAL

## 2015-04-18 MED ORDER — LACTATED RINGERS IV SOLN
INTRAVENOUS | Status: DC
  Administered 2015-04-18 (×2): via INTRAVENOUS

## 2015-04-18 MED ORDER — ZOLPIDEM TARTRATE 5 MG PO TABS: 5.0000 mg | ORAL_TABLET | Freq: Every evening | ORAL | Status: DC | PRN

## 2015-04-18 MED ORDER — SODIUM CHLORIDE 0.9% FLUSH: 3.0000 mL | INTRAVENOUS | Status: DC | PRN

## 2015-04-18 SURGICAL SUPPLY — 67 items
ADH SKN CLS APL DERMABOND .7 (GAUZE/BANDAGES/DRESSINGS)
APL SKNCLS STERI-STRIP NONHPOA (GAUZE/BANDAGES/DRESSINGS)
BENZOIN TINCTURE PRP APPL 2/3 (GAUZE/BANDAGES/DRESSINGS) IMPLANT
BIT DRILL NEURO 2X3.1 SFT TUCH (MISCELLANEOUS) ×1 IMPLANT
BLADE CLIPPER SURG (BLADE) ×4 IMPLANT
BLADE SURG 11 STRL SS (BLADE) ×3 IMPLANT
BUR ROUND FLUTED 5 RND (BURR) ×2 IMPLANT
BUR ROUND FLUTED 5MM RND (BURR) ×1
CANISTER SUCT 3000ML PPV (MISCELLANEOUS) ×6 IMPLANT
CHLORAPREP W/TINT 26ML (MISCELLANEOUS) ×3 IMPLANT
CLOSURE WOUND 1/2 X4 (GAUZE/BANDAGES/DRESSINGS)
DECANTER SPIKE VIAL GLASS SM (MISCELLANEOUS) ×3 IMPLANT
DERMABOND ADVANCED (GAUZE/BANDAGES/DRESSINGS)
DERMABOND ADVANCED .7 DNX12 (GAUZE/BANDAGES/DRESSINGS) ×1 IMPLANT
DRAPE MICROSCOPE LEICA (MISCELLANEOUS) ×3 IMPLANT
DRAPE POUCH INSTRU U-SHP 10X18 (DRAPES) ×3 IMPLANT
DRAPE SURG 17X23 STRL (DRAPES) ×3 IMPLANT
DRILL NEURO 2X3.1 SOFT TOUCH (MISCELLANEOUS) ×3
DRSG OPSITE POSTOP 4X6 (GAUZE/BANDAGES/DRESSINGS) ×2 IMPLANT
ELECT REM PT RETURN 9FT ADLT (ELECTROSURGICAL) ×3
ELECTRODE REM PT RTRN 9FT ADLT (ELECTROSURGICAL) ×1 IMPLANT
GAUZE SPONGE 4X4 12PLY STRL (GAUZE/BANDAGES/DRESSINGS) IMPLANT
GAUZE SPONGE 4X4 16PLY XRAY LF (GAUZE/BANDAGES/DRESSINGS) IMPLANT
GLOVE BIO SURGEON STRL SZ 6 (GLOVE) ×2 IMPLANT
GLOVE BIO SURGEON STRL SZ8 (GLOVE) ×2 IMPLANT
GLOVE BIOGEL PI IND STRL 7.5 (GLOVE) ×1 IMPLANT
GLOVE BIOGEL PI IND STRL 8.5 (GLOVE) IMPLANT
GLOVE BIOGEL PI INDICATOR 7.5 (GLOVE) ×2
GLOVE BIOGEL PI INDICATOR 8.5 (GLOVE) ×2
GLOVE EXAM NITRILE LRG STRL (GLOVE) IMPLANT
GLOVE EXAM NITRILE MD LF STRL (GLOVE) IMPLANT
GLOVE EXAM NITRILE XL STR (GLOVE) IMPLANT
GLOVE EXAM NITRILE XS STR PU (GLOVE) IMPLANT
GLOVE INDICATOR 7.0 STRL GRN (GLOVE) ×2 IMPLANT
GLOVE INDICATOR 7.5 STRL GRN (GLOVE) ×2 IMPLANT
GLOVE SS BIOGEL STRL SZ 7 (GLOVE) ×2 IMPLANT
GLOVE SUPERSENSE BIOGEL SZ 7 (GLOVE) ×4
GOWN STRL REUS W/ TWL LRG LVL3 (GOWN DISPOSABLE) ×1 IMPLANT
GOWN STRL REUS W/ TWL XL LVL3 (GOWN DISPOSABLE) IMPLANT
GOWN STRL REUS W/TWL LRG LVL3 (GOWN DISPOSABLE) ×3
GOWN STRL REUS W/TWL XL LVL3 (GOWN DISPOSABLE)
HEMOSTAT POWDER KIT SURGIFOAM (HEMOSTASIS) ×3 IMPLANT
KIT BASIN OR (CUSTOM PROCEDURE TRAY) ×3 IMPLANT
KIT ROOM TURNOVER OR (KITS) ×3 IMPLANT
LIQUID BAND (GAUZE/BANDAGES/DRESSINGS) ×2 IMPLANT
NDL HYPO 21X1.5 SAFETY (NEEDLE) ×1 IMPLANT
NDL HYPO 25X1 1.5 SAFETY (NEEDLE) ×1 IMPLANT
NEEDLE HYPO 21X1.5 SAFETY (NEEDLE) ×3 IMPLANT
NEEDLE HYPO 25X1 1.5 SAFETY (NEEDLE) ×3 IMPLANT
NS IRRIG 1000ML POUR BTL (IV SOLUTION) ×3 IMPLANT
PACK LAMINECTOMY NEURO (CUSTOM PROCEDURE TRAY) ×3 IMPLANT
PACK UNIVERSAL I (CUSTOM PROCEDURE TRAY) ×3 IMPLANT
PAD ARMBOARD 7.5X6 YLW CONV (MISCELLANEOUS) ×9 IMPLANT
PATTIES SURGICAL .5X1.5 (GAUZE/BANDAGES/DRESSINGS) ×3 IMPLANT
RUBBERBAND STERILE (MISCELLANEOUS) ×6 IMPLANT
SPONGE SURGIFOAM ABS GEL SZ50 (HEMOSTASIS) ×3 IMPLANT
STRIP CLOSURE SKIN 1/2X4 (GAUZE/BANDAGES/DRESSINGS) IMPLANT
SUT VIC AB 0 CT1 18XCR BRD8 (SUTURE) ×1 IMPLANT
SUT VIC AB 0 CT1 8-18 (SUTURE) ×3
SUT VIC AB 2-0 CT1 18 (SUTURE) ×3 IMPLANT
SUT VIC AB 3-0 SH 8-18 (SUTURE) ×3 IMPLANT
SYR 30ML LL (SYRINGE) ×3 IMPLANT
TOWEL OR 17X24 6PK STRL BLUE (TOWEL DISPOSABLE) ×3 IMPLANT
TOWEL OR 17X26 10 PK STRL BLUE (TOWEL DISPOSABLE) ×3 IMPLANT
TUBE CONNECTING 12'X1/4 (SUCTIONS) ×1
TUBE CONNECTING 12X1/4 (SUCTIONS) ×2 IMPLANT
WATER STERILE IRR 1000ML POUR (IV SOLUTION) ×3 IMPLANT

## 2015-04-18 NOTE — Anesthesia Postprocedure Evaluation (Signed)
Anesthesia Post Note  Patient: Cassie Lynn  Procedure(s) Performed: Procedure(s) (LRB): Left Lumbar five sacral-one  Microdiskectomy (Left)  Patient location during evaluation: PACU Anesthesia Type: General Level of consciousness: awake, awake and alert and oriented Pain management: pain level controlled Vital Signs Assessment: post-procedure vital signs reviewed and stable Respiratory status: spontaneous breathing Anesthetic complications: no    Last Vitals:  Filed Vitals:   04/18/15 1731 04/18/15 1755  BP:  108/89  Pulse: 67 64  Temp: 36.3 C 37.1 C  Resp: 18 16    Last Pain:  Filed Vitals:   04/18/15 1758  PainSc: 0-No pain                 Avanna Sowder COKER

## 2015-04-18 NOTE — Op Note (Signed)
04/18/2015  4:37 PM  PATIENT:  Cassie Lynn  45 y.o. female  PRE-OPERATIVE DIAGNOSIS:  Herniated nucleus pulposis L5-S1 left, lumbar radiculopathy  POST-OPERATIVE DIAGNOSIS:  Same  PROCEDURE:  Left L5-S1 hemilaminotomy for discectomy, use of operating microscope  SURGEON:  Aldean Ast, MD  ASSISTANTS: Erline Levine, MD  ANESTHESIA:   General  DRAINS: None  SPECIMEN:  None  INDICATION FOR PROCEDURE: 45 year old female with lumbar radiculopathy affecting her left lower extremity. She has had aggressive medical therapy and has not improved. Her preoperative imaging reveals lateral recess stenosis at L5-S1 on the right due to a disc herniation. Patient understood the risks, benefits, and alternatives and potential outcomes and wished to proceed.  PROCEDURE DETAILS: After smooth induction of general endotracheal anesthesia the patient was turned prone on a regular OR table with a Wilson frame. Her low back was wiped down with alcohol. Lidocaine with epinephrine was injected along the midline. A needle was used to localize the L5-S1 disc space using lateral x-ray. Lidocaine and marcaine with epinephrine was injected at the site of the planned incision.  When the appropriate level was confirmed the skin was prepped and draped in the usual sterile fashion.  A midline incision was made around the previously localized point. Soft tissue was dissected with monopolar cautery. Unilateral subperiosteal dissection was performed on the left side of the spinous process of L5 and the superior laminar surface of S1. The McCullough retractor was inserted into the field with the post through a small fascial incision on the contralateral side. A second film was then taken which showed that the instrument was at the trailing edge of the L5 lamina.  Using a high-speed bur the trailing edge of the L5 lamina was thinned. This was then further removed with Kerrison rongeurs. Ligamentum flavum was removed  and the S1 nerve root was identified. The hemilaminectomy was continued laterally. A medial facetectomy was performed. A foraminotomy was performed at L5-S1 on the left. The thecal sac was medialized at the L5-S1 disc space level. The epidural veins and soft tissues were coagulated with bipolar cautery. There was a large free fragment in the axilla of the nerve root.  Discectomy was performed through the existing dural defect. In addition to the soft free disc fragment there were calcified fragments which were easily removed.  I palpated the ventral surface of the thecal sac and did not identify any persistent compressive pathology. I palpated on the surface of the S1 nerve root and confirmed that there was no compression of the foramen. I was satisfied with the decompression at this point. Hemostasis was obtained.  80 mg of Depo-Medrol was injected around the S1 nerve root and thecal sac. The wound was then closed in routine anatomic layers using interrupted Vicryl sutures. The skin was closed with a running Vicryl suture and sealed with Dermabond. The patient was returned to the supine position on the stretcher.  PATIENT DISPOSITION:  PACU then floor   Delay start of Pharmacological VTE agent (>24hrs) due to surgical blood loss or risk of bleeding:  Yes

## 2015-04-18 NOTE — Transfer of Care (Signed)
Immediate Anesthesia Transfer of Care Note  Patient: Cassie Lynn  Procedure(s) Performed: Procedure(s): Left Lumbar five sacral-one  Microdiskectomy (Left)  Patient Location: PACU  Anesthesia Type:General  Level of Consciousness: awake, alert , oriented and patient cooperative  Airway & Oxygen Therapy: Patient Spontanous Breathing and Patient connected to nasal cannula oxygen  Post-op Assessment: Report given to RN, Post -op Vital signs reviewed and stable and Patient moving all extremities X 4  Post vital signs: Reviewed and stable  Last Vitals:  Filed Vitals:   04/18/15 1136  BP: 111/85  Pulse: 72  Temp: 36.7 C  Resp: 18    Complications: No apparent anesthesia complications

## 2015-04-18 NOTE — Anesthesia Preprocedure Evaluation (Addendum)
Anesthesia Evaluation  Patient identified by MRN, date of birth, ID band Patient awake    Reviewed: Allergy & Precautions, NPO status , Patient's Chart, lab work & pertinent test results  Airway Mallampati: II  TM Distance: >3 FB Neck ROM: Full    Dental  (+) Teeth Intact, Dental Advisory Given   Pulmonary Current Smoker,    breath sounds clear to auscultation       Cardiovascular  Rhythm:Regular Rate:Normal     Neuro/Psych    GI/Hepatic   Endo/Other    Renal/GU      Musculoskeletal   Abdominal (+) + obese,   Peds  Hematology   Anesthesia Other Findings   Reproductive/Obstetrics                            Anesthesia Physical Anesthesia Plan  ASA: II  Anesthesia Plan: General   Post-op Pain Management:    Induction: Intravenous  Airway Management Planned: Oral ETT  Additional Equipment:   Intra-op Plan:   Post-operative Plan:   Informed Consent: I have reviewed the patients History and Physical, chart, labs and discussed the procedure including the risks, benefits and alternatives for the proposed anesthesia with the patient or authorized representative who has indicated his/her understanding and acceptance.     Plan Discussed with:   Anesthesia Plan Comments:         Anesthesia Quick Evaluation

## 2015-04-18 NOTE — Progress Notes (Signed)
No changes since yesterday Back and left leg pain All questions answered Ready for OR

## 2015-04-18 NOTE — H&P (Signed)
History of Present Illness: 1.  back pain  I saw Cassie Lynn today in clinic.  She complains of low back pain that radiates to the back of her left leg to her foot that has been going on for greater than 6 months.  She states that it started acutely and has progressed.  She says it is made worse by activity and nothing makes it better.  She complains of weakness and numbness.  She denies bowel or bladder difficulty.  She has not been to physical therapy.  She does take NSAIDs.       PAST MEDICAL HISTORY, SURGICAL HISTORY, FAMILY HISTORY, SOCIAL HISTORY AND REVIEW OF SYSTEMS I have reviewed the patient's past medical, surgical, family and social history as well as the comprehensive review of systems as included on the [facility] history form dated 04/17/2015, which I have signed.   MEDICATIONS(added, continued or stopped this visit): Started Medication Directions Instruction Stopped   Cymbalta 120 mg ORAL      Percocet 10 mg-325 mg tablet take 1 tablet by oral route  every 6 hours as needed     robaxin  BUCCAL      SOMA 250MG   TABLET      Topamax 15 mg sprinkle capsule take 1 capsule by oral route 2 times every day in the morning and evening     valium 10 ORAL 1 by mouth 47mins before procedure       ALLERGIES: Ingredient Reaction Medication Name Comment  HYDROCODONE BITARTRATE  Vicodin   ACETAMINOPHEN  Vicodin    Reviewed, updated.    Vitals Date Temp F BP Pulse Ht In Wt Lb BMI BSA Pain Score  04/17/2015  121/83 76 64    9/10     PHYSICAL EXAM General Level of Distress: no acute distress Overall Appearance: normal    Musculoskeletal Gait and Station: normal  Right Left Lower Extremity Muscle Strength: normal normal Lower Extremity Muscle Tone: normal normal  Motor Strength Lower extremity motor strength was tested in the clinically pertinent muscles. Any abnormal findings will be noted below.   Right Left Medial  Gastroc:  4/5   Deep Tendon Reflexes  Right Left Patellar: normal normal Achilles: normal decrease  Sensory Sensation was tested at L1 to S1.   Motor and other Tests    Right Left Clonus: normal normal     DIAGNOSTIC RESULTS I have independently reviewed a noncontrasted lumbar spine MRI from October 2016.  She has normal lumbar lordosis and alignment.  She has degenerative disc disease at L4 L5 and L5-S1.  At L5-S1 there is a disc herniation to the left which causes lateral recess narrowing.  She does not have any other meaningful stenosis.    IMPRESSION Cassie Lynn is a 45 year old Caucasian woman with a herniated nucleus pulposus at L5-S1 eccentric to the left which causes lumbar radiculopathy.  She has neurological deficits as described above.  I have recommended left L5-S1 microdiscectomy.We had a long discussion, in language she understands, about the details of the procedure and its risks and benefits.  We also discussed alternatives to the procedure and the risks and benefits of those.  She asked appropriate questions and wishes to proceed with surgery.  We will plan to do this tomorrow.  Completed Orders (this encounter) Order Details Reason Side Interpretation Result Initial Treatment Date Region  Lumbar Spine- AP/Lat/Flex/Ex      04/17/2015    Assessment/Plan # Detail Type Description   1. Assessment  Herniated nucleus pulposis of lumbosacral region (M51.27).       2. Assessment Lumbar radiculopathy (M54.16).       3. Assessment Bilateral back pain, unspecified back location, unspecified chronicity (M54.9).         Pain Assessment/Treatment Pain Scale: 9/10. Method: Numeric Pain Intensity Scale. Location: back. Onset: 10/15/2014. Duration: varies. Quality: discomforting. Pain Assessment/Treatment follow-up plan of care: Patient taking medication as prescribed..  Fall Risk Plan The patient has not fallen in the last year.  Left L5-S1  microdiscectomy

## 2015-04-18 NOTE — Progress Notes (Signed)
Patient admitted from PACU. Patient alert and oriented x 4. Patient oriented to room and made comfortable. 

## 2015-04-18 NOTE — Anesthesia Procedure Notes (Signed)
Procedure Name: Intubation Date/Time: 04/18/2015 2:41 PM Performed by: Julian Reil Pre-anesthesia Checklist: Patient identified, Emergency Drugs available, Suction available and Patient being monitored Patient Re-evaluated:Patient Re-evaluated prior to inductionOxygen Delivery Method: Circle system utilized Preoxygenation: Pre-oxygenation with 100% oxygen Intubation Type: IV induction Ventilation: Mask ventilation without difficulty Laryngoscope Size: Mac and 3 Grade View: Grade I Tube type: Oral Tube size: 7.0 mm Number of attempts: 1 Airway Equipment and Method: Stylet Placement Confirmation: ETT inserted through vocal cords under direct vision,  positive ETCO2 and breath sounds checked- equal and bilateral Secured at: 21 cm Tube secured with: Tape Dental Injury: Teeth and Oropharynx as per pre-operative assessment

## 2015-04-18 NOTE — Progress Notes (Signed)
No acute events Pain improved Full strength Stable To floor

## 2015-04-19 DIAGNOSIS — M5117 Intervertebral disc disorders with radiculopathy, lumbosacral region: Secondary | ICD-10-CM | POA: Diagnosis not present

## 2015-04-19 MED ORDER — PANTOPRAZOLE SODIUM 40 MG PO TBEC: 40.0000 mg | DELAYED_RELEASE_TABLET | Freq: Every day | ORAL | Status: DC

## 2015-04-19 MED ORDER — OXYCODONE-ACETAMINOPHEN 10-325 MG PO TABS: 1.0000 | ORAL_TABLET | Freq: Four times a day (QID) | ORAL | Status: DC | PRN

## 2015-04-19 NOTE — Discharge Summary (Signed)
  Physician Discharge Summary  Patient ID: Cassie Lynn MRN: [id number] DOB/AGE: [birth year] 45 y.o.  Admit date: 04/18/2015 Discharge date: 04/19/2015  Admission Diagnoses:Herniated nucleus pulposis L5-S1 left, lumbar radiculopathy   Discharge Diagnoses: Herniated nucleus pulposis L5-S1 left, lumbar radiculopathy  Active Problems:   Herniated nucleus pulposis of lumbosacral region   Discharged Condition: good  Hospital Course: [name] was admitted and taken to the operating room for an uncomplicated lumbar discetomy. Post op her pain is improved, she is ambulating,voiding, and tolerating a regular diet. Her wound is clean, dry, and without signs of infection.   Treatments: surgery: Left L5-S1 hemilaminotomy for discectomy, use of operating microscope   Discharge Exam: Blood pressure 106/53, pulse 63, temperature 98.4 F (36.9 C), temperature source Oral, resp. rate 18, height 5\' 4"  (1.626 m), weight 68.04 kg (150 lb), last menstrual period 03/31/2015, SpO2 96 %. General appearance: alert, cooperative, appears stated age and no distress Neurologic: Alert and oriented X 3, normal strength and tone. Normal symmetric reflexes. Normal coordination and gait  Disposition: 01-Home or Self Care Dorsalgia, Unspecified    Medication List    TAKE these medications        carisoprodol 350 MG tablet  Commonly known as:  SOMA  TAKE 1 TABLET BY MOUTH 4 TIMES A DAY AS NEEDED FOR MUSCLE SPASM     diazepam 5 MG tablet  Commonly known as:  VALIUM  Take 1 tablet (5 mg total) by mouth every 8 (eight) hours as needed for muscle spasms.     divalproex 500 MG DR tablet  Commonly known as:  DEPAKOTE  Take 1 tablet (500 mg total) by mouth at bedtime. 2 tab po qhs     DULoxetine 60 MG capsule  Commonly known as:  CYMBALTA  Take 1 capsule (60 mg total) by mouth daily.     ibuprofen 800 MG tablet  Commonly known as:  ADVIL,MOTRIN  Take 1 tablet (800 mg total) by mouth 3 (three) times  daily.     methocarbamol 500 MG tablet  Commonly known as:  ROBAXIN  Take 1 tablet (500 mg total) by mouth 2 (two) times daily.     oxyCODONE-acetaminophen 10-325 MG tablet  Commonly known as:  PERCOCET  Take 1 tablet by mouth every 6 (six) hours as needed for pain.     pregabalin 150 MG capsule  Commonly known as:  LYRICA  TAKE ONE CAPSULE BY MOUTH TWICE A DAY     SUMAtriptan 100 MG tablet  Commonly known as:  IMITREX  TAKE 1 TABLET BY MOUTH EVERY 2 HOURS AS NEEDED FOR MIGRAINE.     topiramate 50 MG tablet  Commonly known as:  TOPAMAX  Take 1 tablet (50 mg total) by mouth daily. 2 every night.           Follow-up Information    Follow up with Kevan Ny Ditty, MD In 3 weeks.   Specialty:  Neurosurgery   Why:  please call the office to make an appointment   Contact information:   [address] [place] [address] [phone]       Signed: Jizelle Conkey L 04/19/2015, 9:00 AM

## 2015-04-19 NOTE — Progress Notes (Signed)
Pt d/c to home by car with family. Assessment stable. Prescription given. All questions answered 

## 2015-04-19 NOTE — Discharge Instructions (Addendum)
Laminectomy, Care After Refer to this sheet in the next few weeks. These instructions provide you with information about caring for yourself after your procedure. Your health care provider may also give you more specific instructions. Your treatment has been planned according to current medical practices, but problems sometimes occur. Call your health care provider if you have any problems or questions after your procedure. WHAT TO EXPECT AFTER THE PROCEDURE After your procedure, it is common to have some pain around the incision. HOME CARE INSTRUCTIONS Bathing  You may shower after the bandage (dressing) has been removed or as directed by your health care provider.  Do not take baths, swim, or use a hot tub for 2 weeks or until your incision has healed completely. Check with your health care provider before you start any of these activities. Incision Care  There are many different ways to close and cover an incision, including stitches, skin glue, and adhesive strips. Follow instructions from your health care provider about:  Incision care.  Dressing changes and removal.  Incision closure removal.  Check your incision area every day for signs of infection. Watch for:  Redness, swelling, or pain.  Fluid, blood, or pus. Activity  Return to your normal activities as directed by your health care provider. Ask your health care provider what activities are safe for you.  Perform breathing exercises if directed by your health care provider.  Avoid bending or twisting at your waist. Always bend at your knees.  Do not sit for more than 20-30 minutes at a time. Lie down or walk between periods of sitting.  Do not lift anything that is heavier than 10 lb (4.5 kg).  Do not drive for 2 weeks after your procedure or as directed by your health care provider. You may be a passenger for 20-30 minute trips.  Do not drive or operate heavy machinery while taking pain medicine. General  Instructions  Take medicines only as directed by your health care provider.  Keep all follow-up visits as directed by your health care provider. This is important. SEEK MEDICAL CARE IF:  You have redness, swelling, or increasing pain in the area of your incision.  You notice a bad smell coming from the incision or dressing.  You are not able to return to activities or perform exercises as instructed by your health care provider. SEEK IMMEDIATE MEDICAL CARE IF:  You develop a rash.  You have fluid, blood, or pus coming from your incision.  You have chills or a fever.  You have episodes of dizziness or fainting while standing.  You develop shortness of breath or you have difficulty breathing.  You lose the ability to control your bladder or bowel.  You become weak.  You cannot use your legs.   This information is not intended to replace advice given to you by your health care provider. Make sure you discuss any questions you have with your health care provider.   Document Released: 08/28/2004 Document Revised: 06/25/2014 Document Reviewed: 09/20/2012 Elsevier Interactive Patient Education 2016 [employer] Lumbar Diskectomy, Care After Refer to this sheet in the next few weeks. These instructions provide you with information about caring for yourself after your procedure. Your health care provider may also give you more specific instructions. Your treatment has been planned according to current medical practices, but problems sometimes occur. Call your health care provider if you have any problems or questions after your procedure. WHAT TO EXPECT AFTER THE PROCEDURE After your procedure, it is  common to have:  Pain.  Numbness.  Weakness. HOME CARE INSTRUCTIONS Medicines  Take medicines only as directed by your health care provider.  If you were prescribed an antibiotic medicine, finish all of it even if you start to feel better. Incision Care  There are many  different ways to close and cover an incision, including stitches (sutures), skin glue, and adhesive strips. Follow your health care provider's instructions about:  Incision care.  Bandage (dressing) changes and removal.  Incision closure removal.  Check your incision area every day for signs of infection. If you cannot see your incision, have someone check it for you. Watch for:  Redness, swelling, or pain.  Fluid, blood, or pus. Activities  Avoid sitting for longer than 20 minutes at a time or as directed by your health care provider.  Do not climb stairs more than once each day until your health care provider approves.  Do not bend at your waist. To pick things up, bend your knees.  Do not lift anything that is heavier than 10 lb (4.5 kg) or as directed by your health care provider.  Do not drive a car until your health care provider approves.  Ask your health care provider when you may return to your normal activities, such as playing sports and going back to work.  Work with your physical therapist to learn safe movement and exercises to help healing. Do these exercises as directed.  Take short walks often. General Instructions  Do not use any tobacco products, including cigarettes, chewing tobacco, or electronic cigarettes. If you need help quitting, ask your health care provider.  Follow your health care provider's instructions about bathing. Do not take baths, shower, swim, or use a hot tub until your health care provider approves.  Wear your back brace as directed by your health care provider.  To prevent constipation:  Drink enough fluid to keep your urine clear or pale yellow.  Eat plenty of fruits, vegetables, and whole grains.  Keep all follow-up visits as directed by your health care provider. This is important. This includes any follow-up visits with your physical therapist. [place] IF:  You have a fever.  You have redness, swelling, or pain  in your incision area.  Your pain is not controlled with medicine.  You have pain, numbness, or weakness that lasts longer than three weeks after surgery.  You become constipated. SEEK IMMEDIATE MEDICAL CARE IF:  You have fluid, blood, or pus coming from your incision.  You have increasing pain, numbness, or weakness.  You lose control of when you urinate or have a bowel movement (incontinence).  You have chest pain.  You have trouble breathing.   This information is not intended to replace advice given to you by your health care provider. Make sure you discuss any questions you have with your health care provider.   Document Released: 01/14/2004 Document Revised: 03/01/2014 Document Reviewed: 10/03/2013 Elsevier Interactive Patient Education [employer].

## 2015-04-21 ENCOUNTER — Encounter (HOSPITAL_COMMUNITY): Payer: Self-pay | Admitting: Neurological Surgery

## 2015-05-02 ENCOUNTER — Other Ambulatory Visit: Payer: Self-pay | Admitting: Physician Assistant

## 2015-05-02 NOTE — Telephone Encounter (Signed)
rx called in

## 2015-05-02 NOTE — Telephone Encounter (Signed)
LRF 03/26/15 #[id number] + 1   LOV 04/07/15  Ok refill?

## 2015-05-02 NOTE — Telephone Encounter (Signed)
ok 

## 2015-06-26 ENCOUNTER — Ambulatory Visit (INDEPENDENT_AMBULATORY_CARE_PROVIDER_SITE_OTHER): Payer: Managed Care, Other (non HMO) | Admitting: Family Medicine

## 2015-06-26 ENCOUNTER — Encounter: Payer: Self-pay | Admitting: Family Medicine

## 2015-06-26 VITALS — BP 110/70 | HR 76 | Temp 98.9°F | Resp 16 | Ht 66.0 in | Wt 155.0 lb

## 2015-06-26 DIAGNOSIS — M797 Fibromyalgia: Secondary | ICD-10-CM

## 2015-06-26 DIAGNOSIS — H6981 Other specified disorders of Eustachian tube, right ear: Secondary | ICD-10-CM | POA: Diagnosis not present

## 2015-06-26 MED ORDER — PREGABALIN 150 MG PO CAPS: ORAL_CAPSULE | ORAL | Status: DC

## 2015-06-26 MED ORDER — CARISOPRODOL 350 MG PO TABS
ORAL_TABLET | ORAL | Status: DC
Start: 2015-06-26 — End: 2015-07-22

## 2015-06-27 ENCOUNTER — Encounter: Payer: Self-pay | Admitting: Family Medicine

## 2015-06-27 NOTE — Progress Notes (Signed)
Subjective:    Patient ID: Cassie Lynn, female    DOB: 05-31-1970, 45 y.o.   MRN: [id number]  HPI  04/07/15 Patient was seen in the emergency room in October and again recently for low back pain radiating down her left leg into her left gluteus down the posterior aspect of her left leg into her left foot. She rates the pain as 9 on a scale of 10. She reports numbness and tingling in the left leg. She is unable to sit down due to the severity of the pain. She also reports some muscle weakness in her left leg. She had an MRI of her lumbar spine obtained in October which revealed: 1. Left paracentral disc protrusion at L5-S1, abutting the transiting left S1 nerve root in the left lateral recess. 2. 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 These disc protrusions could both potentially result in left lower extremity radicular symptoms. 3. Mild facet arthrosis at L4-5 and L5-S1. She was seen in the emergency room last night and was started on a prednisone taper pack. She continues to complain of pain on 9 on a scale of 10.  At that time, my plan was: Continue the prednisone given to her in the emergency room. I did give the patient prescription for Percocet 10/325 one by mouth every 6 hours when necessary pain. I will also consult interventional radiology for possible epidural steroid injection at L5-S1 where I believe the majority of her pain is located.  06/26/15 Patient subsequently underwent back surgery and is now doing much better she is here today for 2 concerns. 1 she is requesting a refill on Lyrica which she takes for fibromyalgia. She is currently on Cymbalta 60 mg a day and Lyrica 150 mg by mouth twice a day. This seems to help substantially with her fibromyalgia pain. Unfortunately her insurance is not covering Lyrica. Is going to  cost her $200 a month. She also reports that she feels like "I'm in a tunnel" in her right ear. She reports decreased hearing, muffled sounds, and ear pressure. On examination today there is no obstruction and the external auditory canal. The tympanic membrane is pearly gray and iridescent however there is a middle ear effusion. Past Medical History  Diagnosis Date  . Anemia   . 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 Fibromyalgia   . Shortness of breath dyspnea   . Migraine     Migraines.  Takes Depakote   Past Surgical History  Procedure Laterality Date  . Cholecystectomy    . Cesarean section    . Lumbar laminectomy/decompression microdiscectomy Left 04/18/2015    Procedure: Left Lumbar five sacral-one  Microdiskectomy;  Surgeon: Kevan Ny Ditty, MD;  Location: [facility];  Service: Neurosurgery;  Laterality: Left;   Current Outpatient Prescriptions on File Prior to Visit  Medication Sig Dispense Refill  . diazepam (VALIUM) 5 MG tablet Take 1 tablet (5 mg total) by mouth every 8 (eight) hours as needed for muscle spasms. 12 tablet 0  . divalproex (DEPAKOTE) 500 MG DR tablet Take 1 tablet (500 mg total) by mouth at bedtime. 2 tab po qhs (Patient taking differently: Take 1,000 mg by mouth at bedtime. ) 60 tablet 2  . DULoxetine (CYMBALTA) 60 MG capsule Take 1 capsule (60 mg total) by mouth daily. 30 capsule 5  .  ibuprofen (ADVIL,MOTRIN) 800 MG tablet Take 1 tablet (800 mg total) by mouth 3 (three) times daily. 21 tablet 0  . methocarbamol (ROBAXIN) 500 MG tablet Take 1 tablet (500 mg total) by mouth 2 (two) times daily. 20 tablet 0  . oxyCODONE-acetaminophen (PERCOCET) 10-325 MG tablet Take 1 tablet by mouth every 6 (six) hours as needed for pain. 60 tablet 0  . SUMAtriptan (IMITREX) 100 MG tablet TAKE 1 TABLET BY MOUTH EVERY 2 HOURS AS NEEDED FOR MIGRAINE. 10 tablet 0  . topiramate (TOPAMAX) 50 MG tablet Take 1 tablet (50 mg total) by mouth daily. 2 every night. 90 tablet 2     No current facility-administered medications on file prior to visit.   Allergies  Allergen Reactions  . Vicodin [Hydrocodone-Acetaminophen] Other (See Comments)    Makes her feel like she cannot breath, but no throat swelling. No issues with plain Tylenol   Social History   Social History  . Marital Status: Single    Spouse Name: N/A  . Number of Children: N/A  . Years of Education: N/A   Occupational History  . Not on file.   Social History Main Topics  . Smoking status: Current Every Day Smoker -- 0.50 packs/day for 28 years    Types: Cigarettes  . Smokeless tobacco: Not on file  . Alcohol Use: Yes     Comment: occ   . Drug Use: No  . Sexual Activity: Not on file   Other Topics Concern  . Not on file   Social History Narrative     Review of Systems  All other systems reviewed and are negative.      Objective:   Physical Exam  HENT:  Right Ear: Ear canal normal. No drainage or swelling. Tympanic membrane is not injected, not scarred, not perforated, not erythematous and not retracted. A middle ear effusion is present. Decreased hearing is noted.  Left Ear: Tympanic membrane and ear canal normal.  Cardiovascular: Normal rate, regular rhythm and normal heart sounds.   Pulmonary/Chest: Effort normal and breath sounds normal. No respiratory distress. She has no wheezes. She has no rales.  Neurological: She has normal reflexes. No cranial nerve deficit. She exhibits normal muscle tone. Coordination normal.  Vitals reviewed.         Assessment & Plan:  Fibromyalgia - Plan: pregabalin (LYRICA) 150 MG capsule  Eustachian tube dysfunction, right  I refilled the patient's Lyrica 150 mg by mouth twice a day but I also gave her read a cart hopefully that will decrease her co-pay to $25 which she can afford. Continue Cymbalta. I did give her a refill on Soma 350 mg tid prn which she can use for muscle spasms and muscle pain in case the Lyrica is not covered. I  believe she has right-sided eustachian tube dysfunction. I recommended eustachian tube exercises such as chewing gum and Valsalva techniques to try to help open the eustachian tube. Also recommended a combination of Flonase and Zyrtec. Should symptoms not be improving by next week, I recommended a consultation with ear nose and throat.

## 2015-07-22 ENCOUNTER — Other Ambulatory Visit: Payer: Self-pay | Admitting: Physician Assistant

## 2015-07-22 ENCOUNTER — Other Ambulatory Visit: Payer: Self-pay | Admitting: Family Medicine

## 2015-07-22 NOTE — Telephone Encounter (Signed)
LRF 06/26/15 #60  LOV 06/26/15  OK refill?

## 2015-07-22 NOTE — Telephone Encounter (Signed)
Refill approved by provider and called in

## 2015-07-22 NOTE — Telephone Encounter (Signed)
Medication refilled per protocol. 

## 2015-07-24 NOTE — Telephone Encounter (Signed)
ok 

## 2015-08-14 ENCOUNTER — Telehealth: Payer: Self-pay | Admitting: *Deleted

## 2015-08-14 NOTE — Telephone Encounter (Signed)
Call placed to [facility].   Appointment scheduled for 08/15/2015 at 9:45am.   Call placed to patient and patient made aware per VM.

## 2015-08-14 NOTE — Telephone Encounter (Signed)
Received call from patient.   Reports that she is having increased back pain. States that she has been trying to contact Neurosurgeon x2 days with no answer.   Call placed to [facility] and [place], Dr. Cyndy Freeze [phone].  LM for Network engineer, [employer].

## 2015-08-15 DIAGNOSIS — M461 Sacroiliitis, not elsewhere classified: Secondary | ICD-10-CM | POA: Insufficient documentation

## 2015-09-22 ENCOUNTER — Other Ambulatory Visit: Payer: Self-pay | Admitting: Family Medicine

## 2015-09-24 NOTE — Telephone Encounter (Signed)
Ok to refill??  Last office visit 06/26/2015.  Last refill 07/22/2015.

## 2015-09-25 NOTE — Telephone Encounter (Signed)
Medication called to pharmacy. 

## 2015-09-25 NOTE — Telephone Encounter (Signed)
ok 

## 2015-12-01 ENCOUNTER — Other Ambulatory Visit: Payer: Self-pay | Admitting: Family Medicine

## 2015-12-02 NOTE — Telephone Encounter (Signed)
Cassie Lynn is habit forming.  We need to switch her to a non-habit-forming muscle relaxer for long-term use. Switch to Zanaflex 4 mg every 8 hours when necessary (90)

## 2015-12-02 NOTE — Telephone Encounter (Signed)
Ok to refill 

## 2015-12-03 ENCOUNTER — Other Ambulatory Visit: Payer: Self-pay | Admitting: Family Medicine

## 2015-12-03 NOTE — Telephone Encounter (Signed)
Ok to refill 

## 2015-12-03 NOTE — Telephone Encounter (Signed)
LMTRC

## 2015-12-04 MED ORDER — TIZANIDINE HCL 4 MG PO CAPS: 4.0000 mg | ORAL_CAPSULE | Freq: Three times a day (TID) | ORAL | 0 refills | Status: DC | PRN

## 2015-12-04 NOTE — Telephone Encounter (Signed)
Refill for Soma denied.   Prescription sent to pharmacy for Zanaflex.

## 2015-12-04 NOTE — Telephone Encounter (Signed)
Please see note on this refill I sent to [facility]. Zanaflex 4 mg poq 8rs prn (90)

## 2016-01-09 ENCOUNTER — Other Ambulatory Visit: Payer: Self-pay | Admitting: Family Medicine

## 2016-01-09 NOTE — Telephone Encounter (Signed)
okay

## 2016-01-09 NOTE — Telephone Encounter (Signed)
Prescription sent to pharmacy.

## 2016-01-09 NOTE — Telephone Encounter (Signed)
Ok to refill 

## 2016-01-19 ENCOUNTER — Encounter: Payer: Self-pay | Admitting: Family Medicine

## 2016-01-19 ENCOUNTER — Ambulatory Visit (INDEPENDENT_AMBULATORY_CARE_PROVIDER_SITE_OTHER): Payer: Managed Care, Other (non HMO) | Admitting: Family Medicine

## 2016-01-19 VITALS — BP 108/70 | HR 74 | Temp 99.2°F | Resp 18 | Ht 66.0 in | Wt 172.0 lb

## 2016-01-19 DIAGNOSIS — L608 Other nail disorders: Secondary | ICD-10-CM

## 2016-01-19 LAB — COMPLETE METABOLIC PANEL WITH GFR
ALT: 11 U/L (ref 6–29)
AST: 13 U/L (ref 10–35)
Albumin: 4.1 g/dL (ref 3.6–5.1)
Alkaline Phosphatase: 40 U/L (ref 33–115)
BILIRUBIN TOTAL: 0.4 mg/dL (ref 0.2–1.2)
BUN: 17 mg/dL (ref 7–25)
CHLORIDE: 108 mmol/L (ref 98–110)
CO2: 22 mmol/L (ref 20–31)
Calcium: 9.1 mg/dL (ref 8.6–10.2)
Creat: 0.69 mg/dL (ref 0.50–1.10)
Glucose, Bld: 82 mg/dL (ref 70–99)
Potassium: 4.2 mmol/L (ref 3.5–5.3)
Sodium: 138 mmol/L (ref 135–146)
Total Protein: 7.3 g/dL (ref 6.1–8.1)

## 2016-01-19 LAB — CBC WITH DIFFERENTIAL/PLATELET
BASOS ABS: 99 {cells}/uL (ref 0–200)
BASOS PCT: 1 %
EOS ABS: 891 {cells}/uL — AB (ref 15–500)
Eosinophils Relative: 9 %
HCT: 41.3 % (ref 35.0–45.0)
HEMOGLOBIN: 13.1 g/dL (ref 12.0–15.0)
LYMPHS ABS: 2574 {cells}/uL (ref 850–3900)
Lymphocytes Relative: 26 %
MCH: 28.2 pg (ref 27.0–33.0)
MCHC: 31.7 g/dL — ABNORMAL LOW (ref 32.0–36.0)
MCV: 89 fL (ref 80.0–100.0)
MONO ABS: 594 {cells}/uL (ref 200–950)
MONOS PCT: 6 %
MPV: 10.4 fL (ref 7.5–12.5)
NEUTROS ABS: 5742 {cells}/uL (ref 1500–7800)
Neutrophils Relative %: 58 %
PLATELETS: 314 10*3/uL (ref 140–400)
RBC: 4.64 MIL/uL (ref 3.80–5.10)
RDW: 15.8 % — ABNORMAL HIGH (ref 11.0–15.0)
WBC: 9.9 10*3/uL (ref 3.8–10.8)

## 2016-01-19 NOTE — Progress Notes (Signed)
Subjective:    Patient ID: Cassie Lynn, female    DOB: 03-06-1970, 45 y.o.   MRN: [id number]  HPI Patient is concerned due to changes she's noticed in her fingernails over the last few weeks. The proximal portion of every fingernail is hypopigmented and white. The distal portion of every fingernail, or better described underlying nail bed, is erythematous creating the appearance of an erythematous pink transverse stripe at the distal tip of each finger under the nailbed. There is no splinter hemorrhage. She is also concerned due to chronic paresthesias on the right side of her face in the T1 and T2 distribution. There is no evidence of cranial nerve dysfunction. There is no evidence of stroke or neurologic impairment. She denies any headaches Past Medical History:  Diagnosis Date  . Anemia   . Aneurysm ([place])    brain- 2015ish was gone  . Fibromyalgia   . Migraine    Migraines.  Takes Depakote  . Shortness of breath dyspnea    Past Surgical History:  Procedure Laterality Date  . CESAREAN SECTION    . CHOLECYSTECTOMY    . LUMBAR LAMINECTOMY/DECOMPRESSION MICRODISCECTOMY Left 04/18/2015   Procedure: Left Lumbar five sacral-one  Microdiskectomy;  Surgeon: Kevan Ny Ditty, MD;  Location: [facility];  Service: Neurosurgery;  Laterality: Left;   Current Outpatient Prescriptions on File Prior to Visit  Medication Sig Dispense Refill  . diazepam (VALIUM) 5 MG tablet Take 1 tablet (5 mg total) by mouth every 8 (eight) hours as needed for muscle spasms. 12 tablet 0  . DULoxetine (CYMBALTA) 60 MG capsule Take 1 capsule (60 mg total) by mouth daily. 30 capsule 5  . ibuprofen (ADVIL,MOTRIN) 800 MG tablet Take 1 tablet (800 mg total) by mouth 3 (three) times daily. 21 tablet 0  . methocarbamol (ROBAXIN) 500 MG tablet Take 1 tablet (500 mg total) by mouth 2 (two) times daily. 20 tablet 0  . pregabalin (LYRICA) 150 MG capsule TAKE ONE CAPSULE BY MOUTH TWICE A DAY 60 capsule 5  . tiZANidine  (ZANAFLEX) 4 MG capsule TAKE ONE CAPSULE BY MOUTH 3 TIMES A DAY AS NEEDED FOR MUSCLE SPASMS 90 capsule 0  . topiramate (TOPAMAX) 50 MG tablet TAKE 1 TABLET BY MOUTH DAILY AND 2 TABLETS EVERY NIGHT 90 tablet 2  . SUMAtriptan (IMITREX) 100 MG tablet TAKE 1 TABLET BY MOUTH EVERY 2 HOURS AS NEEDED FOR MIGRAINE. (Patient not taking: Reported on 01/19/2016) 10 tablet 0   No current facility-administered medications on file prior to visit.    Allergies  Allergen Reactions  . Vicodin [Hydrocodone-Acetaminophen] Other (See Comments)    Makes her feel like she cannot breath, but no throat swelling. No issues with plain Tylenol   Social History   Social History  . Marital status: Single    Spouse name: N/A  . Number of children: N/A  . Years of education: N/A   Occupational History  . Not on file.   Social History Main Topics  . Smoking status: Current Every Day Smoker    Packs/day: 0.50    Years: 28.00    Types: Cigarettes  . Smokeless tobacco: Not on file  . Alcohol use Yes     Comment: occ   . Drug use: No  . Sexual activity: Not on file   Other Topics Concern  . Not on file   Social History Narrative  . No narrative on file     Review of Systems  All other systems reviewed and  are negative.      Objective:   Physical Exam  Constitutional: She is oriented to person, place, and time.  HENT:  Right Ear: Ear canal normal. No drainage or swelling. Tympanic membrane is not injected, not scarred, not perforated, not erythematous and not retracted. No middle ear effusion. No decreased hearing is noted.  Left Ear: Tympanic membrane and ear canal normal. No drainage or swelling. Tympanic membrane is not injected, not scarred, not perforated, not erythematous and not retracted.  No middle ear effusion. No decreased hearing is noted.  Cardiovascular: Normal rate, regular rhythm and normal heart sounds.   Pulmonary/Chest: Effort normal and breath sounds normal. No respiratory  distress. She has no wheezes. She has no rales.  Neurological: She is alert and oriented to person, place, and time. She has normal reflexes. She displays normal reflexes. No cranial nerve deficit. She exhibits normal muscle tone. Coordination normal.  Skin: No rash noted. No erythema. No pallor.  Vitals reviewed.  Proximal portion of each fingernail and the underlying nail bed is hypopigmented and light in color. Distal portion of the nail bed is erythematous creating the appearance of a transverse erythematous streak across the distal tip of each fingernail. There is no splinter hemorrhage.       Assessment & Plan:  Terry's nails - Plan: CBC with Differential/Platelet, COMPLETE METABOLIC PANEL WITH GFR I believe is a benign finding related to aging. Research online suggests that we should evaluate for renal dysfunction, bone marrow abnormalities such as anemia, and hepatic dysfunction. Therefore we'll check a CBC and a CMP. I reassured the patient that I do not think this is a significant underlying medical problem although I would recommend smoking cessation prevent the development of peripheral vascular disease. The occasional tingling and paresthesia that the patient is experiencing on the right side of her face in the trigeminal distribution could be a sign of early trigeminal neuralgia. There is no evidence for stroke on today's exam. The symptom is constant and chronic. Consider MRI of the brain pending the results of her lab work

## 2016-01-21 ENCOUNTER — Other Ambulatory Visit: Payer: Self-pay | Admitting: Family Medicine

## 2016-01-21 DIAGNOSIS — M797 Fibromyalgia: Secondary | ICD-10-CM

## 2016-01-22 NOTE — Telephone Encounter (Signed)
Approved. # 60 + 5. 

## 2016-01-22 NOTE — Telephone Encounter (Signed)
Medication called to pharmacy. 

## 2016-01-22 NOTE — Telephone Encounter (Signed)
Ok to refill Lyrica? 

## 2016-06-08 ENCOUNTER — Other Ambulatory Visit: Payer: Self-pay | Admitting: Physician Assistant

## 2016-06-18 ENCOUNTER — Ambulatory Visit (INDEPENDENT_AMBULATORY_CARE_PROVIDER_SITE_OTHER): Payer: Managed Care, Other (non HMO) | Admitting: Family Medicine

## 2016-06-18 VITALS — BP 130/70 | HR 80 | Temp 98.5°F | Ht 66.0 in | Wt 178.0 lb

## 2016-06-18 DIAGNOSIS — M79671 Pain in right foot: Secondary | ICD-10-CM | POA: Diagnosis not present

## 2016-06-18 MED ORDER — DICLOFENAC SODIUM 1 % TD GEL: 2.0000 g | Freq: Four times a day (QID) | TRANSDERMAL | 0 refills | Status: DC

## 2016-06-18 NOTE — Progress Notes (Signed)
Subjective:    Patient ID: Cassie Lynn, female    DOB: [birth year], 46 y.o.   MRN: [id number]  HPI Patient presents with several weeks of pain in her right foot. Pain is localized to the lateral aspect of the fifth MTP joint and the plantar aspect of the fifth MTP joint. In that area the skin is erythematous compared to the surrounding tissue. There is no warmth or swelling. However there is bruising. The laterals aspect of the fifth metatarsal is tender to palpation. There is no plantar's wart or callus or corn in that area. There is no skin breakdown or ulcer. It appears to be a bruise/contusion from chronic pressure.  On examination, I washed the patient walk around the office barefoot. There is no excessive supination of her foot that would explain the bruising.  However she is carrying her weight on her heel and then rolling to her toe to avoid putting pressure in the affected area Past Medical History:  Diagnosis Date  . Anemia   . Aneurysm ([place])    brain- 2015ish was gone  . Fibromyalgia   . Migraine    Migraines.  Takes Depakote  . Shortness of breath dyspnea    Past Surgical History:  Procedure Laterality Date  . CESAREAN SECTION    . CHOLECYSTECTOMY    . LUMBAR LAMINECTOMY/DECOMPRESSION MICRODISCECTOMY Left 04/18/2015   Procedure: Left Lumbar five sacral-one  Microdiskectomy;  Surgeon: Kevan Ny Ditty, MD;  Location: [place];  Service: Neurosurgery;  Laterality: Left;   Current Outpatient Prescriptions on File Prior to Visit  Medication Sig Dispense Refill  . DULoxetine (CYMBALTA) 60 MG capsule Take 1 capsule (60 mg total) by mouth daily. 30 capsule 5  . gabapentin (NEURONTIN) 300 MG capsule Take 300 mg by mouth daily with breakfast.   2  . LYRICA 150 MG capsule TAKE ONE CAPSULE BY MOUTH TWICE A DAY 60 capsule 5  . meloxicam (MOBIC) 7.5 MG tablet Take 7.5 mg by mouth every 12 (twelve) hours.  2  . tiZANidine (ZANAFLEX) 4 MG capsule TAKE ONE CAPSULE BY MOUTH 3  TIMES A DAY AS NEEDED FOR MUSCLE SPASMS 90 capsule 0  . topiramate (TOPAMAX) 50 MG tablet TAKE 1 TABLET BY MOUTH DAILY AND 2 TABLETS EVERY NIGHT 90 tablet 2  . diazepam (VALIUM) 5 MG tablet Take 1 tablet (5 mg total) by mouth every 8 (eight) hours as needed for muscle spasms. (Patient not taking: Reported on 06/18/2016) 12 tablet 0  . ibuprofen (ADVIL,MOTRIN) 800 MG tablet Take 1 tablet (800 mg total) by mouth 3 (three) times daily. (Patient not taking: Reported on 06/18/2016) 21 tablet 0  . methocarbamol (ROBAXIN) 500 MG tablet Take 1 tablet (500 mg total) by mouth 2 (two) times daily. (Patient not taking: Reported on 06/18/2016) 20 tablet 0  . SUMAtriptan (IMITREX) 100 MG tablet TAKE 1 TABLET BY MOUTH EVERY 2 HOURS AS NEEDED FOR MIGRAINE. (Patient not taking: Reported on 01/19/2016) 10 tablet 0   No current facility-administered medications on file prior to visit.    Allergies  Allergen Reactions  . Vicodin [Hydrocodone-Acetaminophen] Other (See Comments)    Makes her feel like she cannot breath, but no throat swelling. No issues with plain Tylenol   Social History   Social History  . Marital status: Single    Spouse name: N/A  . Number of children: N/A  . Years of education: N/A   Occupational History  . Not on file.   Social History Main  Topics  . Smoking status: Current Every Day Smoker    Packs/day: 0.50    Years: 28.00    Types: Cigarettes  . Smokeless tobacco: Not on file  . Alcohol use Yes     Comment: occ   . Drug use: No  . Sexual activity: Not on file   Other Topics Concern  . Not on file   Social History Narrative  . No narrative on file      Review of Systems  All other systems reviewed and are negative.      Objective:   Physical Exam  Cardiovascular: Normal rate, regular rhythm and normal heart sounds.   Pulmonary/Chest: Effort normal and breath sounds normal. No respiratory distress. She has no wheezes. She has no rales.  Musculoskeletal:        Right foot: There is tenderness, bony tenderness and deformity. There is normal range of motion and no swelling.       Feet:  Vitals reviewed.         Assessment & Plan:  Right foot pain - Plan: DG Foot Complete Right, diclofenac sodium (VOLTAREN) 1 % GEL  Exam is abnormal but not remarkably so to explain the level of pain the patient is feeling. It appears to be bony tenderness secondary to contusion from chronic pressure. 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 I have recommended that she get a callus pad, cut the center out to create a halo that she can then placed around the tender area to offload the pressure and wear that on a daily basis. She can also apply Voltaren gel 2 g 4 times a day to the affected area. Obtain x-rays of the foot. Recheck in one week or sooner if worse

## 2016-06-23 ENCOUNTER — Ambulatory Visit
Admission: RE | Admit: 2016-06-23 | Discharge: 2016-06-23 | Disposition: A | Discharge status: Home / Self Care | Payer: Managed Care, Other (non HMO) | Source: Ambulatory Visit | Attending: Family Medicine | Admitting: Family Medicine

## 2016-06-23 DIAGNOSIS — M79671 Pain in right foot: Secondary | ICD-10-CM

## 2016-09-10 ENCOUNTER — Other Ambulatory Visit: Payer: Self-pay | Admitting: Family Medicine

## 2016-09-16 ENCOUNTER — Ambulatory Visit (INDEPENDENT_AMBULATORY_CARE_PROVIDER_SITE_OTHER): Payer: Managed Care, Other (non HMO) | Admitting: Family Medicine

## 2016-09-16 ENCOUNTER — Ambulatory Visit
Admission: RE | Admit: 2016-09-16 | Discharge: 2016-09-16 | Disposition: A | Discharge status: Home / Self Care | Payer: Managed Care, Other (non HMO) | Source: Ambulatory Visit | Attending: Family Medicine | Admitting: Family Medicine

## 2016-09-16 ENCOUNTER — Encounter: Payer: Self-pay | Admitting: Family Medicine

## 2016-09-16 VITALS — BP 120/74 | HR 68 | Temp 98.1°F | Resp 14 | Ht 66.0 in | Wt 176.0 lb

## 2016-09-16 DIAGNOSIS — M79672 Pain in left foot: Secondary | ICD-10-CM | POA: Diagnosis not present

## 2016-09-16 DIAGNOSIS — I739 Peripheral vascular disease, unspecified: Secondary | ICD-10-CM | POA: Diagnosis not present

## 2016-09-16 DIAGNOSIS — I75022 Atheroembolism of left lower extremity: Secondary | ICD-10-CM

## 2016-09-16 LAB — LIPID PANEL
CHOL/HDL RATIO: 4 ratio (ref <5.0)
Cholesterol: 148 mg/dL (ref <200)
HDL: 37 mg/dL — ABNORMAL LOW (ref >50)
LDL CALC: 95 mg/dL (ref <100)
Triglycerides: 81 mg/dL (ref <150)
VLDL: 16 mg/dL (ref <30)

## 2016-09-16 LAB — CBC WITH DIFFERENTIAL/PLATELET
BASOS PCT: 1 %
Basophils Absolute: 86 cells/uL (ref 0–200)
EOS ABS: 258 {cells}/uL (ref 15–500)
EOS PCT: 3 %
HCT: 37.5 % (ref 35.0–45.0)
HEMOGLOBIN: 12.3 g/dL (ref 12.0–15.0)
LYMPHS ABS: 3268 {cells}/uL (ref 850–3900)
Lymphocytes Relative: 38 %
MCH: 29.6 pg (ref 27.0–33.0)
MCHC: 32.8 g/dL (ref 32.0–36.0)
MCV: 90.4 fL (ref 80.0–100.0)
MONOS PCT: 7 %
MPV: 10.5 fL (ref 7.5–12.5)
Monocytes Absolute: 602 cells/uL (ref 200–950)
NEUTROS ABS: 4386 {cells}/uL (ref 1500–7800)
Neutrophils Relative %: 51 %
PLATELETS: 255 10*3/uL (ref 140–400)
RBC: 4.15 MIL/uL (ref 3.80–5.10)
RDW: 14.6 % (ref 11.0–15.0)
WBC: 8.6 10*3/uL (ref 3.8–10.8)

## 2016-09-16 LAB — COMPLETE METABOLIC PANEL WITH GFR
ALBUMIN: 4.2 g/dL (ref 3.6–5.1)
ALK PHOS: 45 U/L (ref 33–115)
ALT: 13 U/L (ref 6–29)
AST: 13 U/L (ref 10–35)
BUN: 13 mg/dL (ref 7–25)
CHLORIDE: 111 mmol/L — AB (ref 98–110)
CO2: 16 mmol/L — AB (ref 20–31)
Calcium: 9.2 mg/dL (ref 8.6–10.2)
Creat: 0.9 mg/dL (ref 0.50–1.10)
GFR, EST NON AFRICAN AMERICAN: 77 mL/min (ref >=60)
GFR, Est African American: 89 mL/min (ref >=60)
GLUCOSE: 84 mg/dL (ref 70–99)
POTASSIUM: 4.3 mmol/L (ref 3.5–5.3)
SODIUM: 139 mmol/L (ref 135–146)
Total Bilirubin: 0.3 mg/dL (ref 0.2–1.2)
Total Protein: 7.1 g/dL (ref 6.1–8.1)

## 2016-09-16 NOTE — Progress Notes (Signed)
Subjective:    Patient ID: Cassie Lynn, female    DOB: 1970-09-25, 46 y.o.   MRN: [id number]  HPI  06/18/16 Patient presents with several weeks of pain in her right foot. Pain is localized to the lateral aspect of the fifth MTP joint and the plantar aspect of the fifth MTP joint. In that area the skin is erythematous compared to the surrounding tissue. There is no warmth or swelling. However there is bruising. The laterals aspect of the fifth metatarsal is tender to palpation. There is no plantar's wart or callus or corn in that area. There is no skin breakdown or ulcer. It appears to be a bruise/contusion from chronic pressure.  On examination, I washed the patient walk around the office barefoot. There is no excessive supination of her foot that would explain the bruising.  However she is carrying her weight on her heel and then rolling to her toe to avoid putting pressure in the affected area.  At that time, my plan was: Exam is abnormal but not remarkably so to explain the level of pain the patient is feeling. It appears to be bony tenderness secondary to contusion from chronic pressure. 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 I have recommended that she get a callus pad, cut the center out to create a halo that she can then placed around the tender area to offload the pressure and wear that on a daily basis. She can also apply Voltaren gel 2 g 4 times a day to the affected area. Obtain x-rays of the foot. Recheck in one week or sooner if worse  09/16/16 Xrays were negative.  Patient used the voltaren gel and the pain went away.  However, the "bruise" or purple discoloration over the lateral aspect of the 5th MTP join never went away.  In fact, it has spread onto the dorsum of the foot.  Also, her left fourth toe has now become purple distal to the IP joint and exquisitely tender to palpation.   Both findings are concerning for vascular insufficiency. The left fourth toe has been purple for more than 1 month.  There is a 4 mm bruise on the plantar surface.  Minimal movement causes pain.   Past Medical History:  Diagnosis Date  . Anemia   . Aneurysm ([place])    brain- 2015ish was gone  . Fibromyalgia   . Migraine    Migraines.  Takes Depakote  . Shortness of breath dyspnea    Past Surgical History:  Procedure Laterality Date  . CESAREAN SECTION    . CHOLECYSTECTOMY    . LUMBAR LAMINECTOMY/DECOMPRESSION MICRODISCECTOMY Left 04/18/2015   Procedure: Left Lumbar five sacral-one  Microdiskectomy;  Surgeon: Kevan Ny Ditty, MD;  Location: [facility];  Service: Neurosurgery;  Laterality: Left;   Current Outpatient Prescriptions on File Prior to Visit  Medication Sig Dispense Refill  . diclofenac sodium (VOLTAREN) 1 % GEL Apply 2 g topically 4 (four) times daily. 100 g 0  . DULoxetine (CYMBALTA) 60 MG capsule Take 1 capsule (60 mg total) by mouth daily. 30 capsule 5  . gabapentin (NEURONTIN) 300 MG capsule Take 300 mg by mouth daily with breakfast.   2  . LYRICA 150 MG capsule TAKE ONE CAPSULE BY MOUTH TWICE A DAY 60 capsule 5  . meloxicam (MOBIC) 7.5 MG tablet Take 7.5 mg by mouth every 12 (twelve)  hours.  2  . methocarbamol (ROBAXIN) 500 MG tablet Take 1 tablet (500 mg total) by mouth 2 (two) times daily. 20 tablet 0  . topiramate (TOPAMAX) 50 MG tablet TAKE 1 TABLET BY MOUTH DAILY AND 2 TABLETS EVERY NIGHT 90 tablet 2  . SUMAtriptan (IMITREX) 100 MG tablet TAKE 1 TABLET BY MOUTH EVERY 2 HOURS AS NEEDED FOR MIGRAINE. (Patient not taking: Reported on 01/19/2016) 10 tablet 0   No current facility-administered medications on file prior to visit.    Allergies  Allergen Reactions  . Vicodin [Hydrocodone-Acetaminophen] Other (See Comments)    Makes her feel like she cannot breath, but no throat swelling. No issues with plain Tylenol   Social History   Social History  . Marital  status: Single    Spouse name: N/A  . Number of children: N/A  . Years of education: N/A   Occupational History  . Not on file.   Social History Main Topics  . Smoking status: Current Every Day Smoker    Packs/day: 0.50    Years: 28.00    Types: Cigarettes  . Smokeless tobacco: Never Used  . Alcohol use Yes     Comment: occ   . Drug use: No  . Sexual activity: Not on file   Other Topics Concern  . Not on file   Social History Narrative  . No narrative on file      Review of Systems  All other systems reviewed and are negative.      Objective:   Physical Exam  Cardiovascular: Normal rate, regular rhythm and normal heart sounds.   Pulses:      Dorsalis pedis pulses are 1+ on the right side, and 1+ on the left side.       Posterior tibial pulses are 1+ on the right side, and 1+ on the left side.  Pulmonary/Chest: Effort normal and breath sounds normal. No respiratory distress. She has no wheezes. She has no rales.  Musculoskeletal:       Right foot: There is tenderness, bony tenderness, decreased capillary refill and deformity. There is normal range of motion and no swelling.       Left foot: There is tenderness, bony tenderness, swelling, decreased capillary refill and deformity.       Feet:  Vitals reviewed.         Assessment & Plan:  Pain in left foot - Plan: DG Foot Complete Left, VAS [place] ABI WITH/WO TBI  PVD (peripheral vascular disease) ([place]) - Plan: CBC with Differential/Platelet, COMPLETE METABOLIC PANEL WITH GFR, Lipid panel, VAS [place] ABI WITH/WO TBI  Purple toe syndrome, left ([facility]) - Plan: VAS [place] ABI WITH/WO TBI I believe the patient has compromised blood flow to the feet causing hypoxic damage. Therefore I'll schedule patient for urgent arterial Dopplers with ABIs and TBI to evaluate further. Meanwhile I'll obtain an x-ray of the left foot to rule out osteomyelitis in the left fourth toe. Patient will likely need to see a vascular surgeon for  revascularization options. I will check a cholesterol and will recommend a statin for LDL cholesterol is greater than 70. I recommended the patient begin aspirin 81 mg a day. Also recommended that she quit smoking.

## 2016-09-17 ENCOUNTER — Other Ambulatory Visit: Payer: Self-pay | Admitting: Family Medicine

## 2016-09-17 DIAGNOSIS — I739 Peripheral vascular disease, unspecified: Secondary | ICD-10-CM

## 2016-09-17 DIAGNOSIS — I75022 Atheroembolism of left lower extremity: Secondary | ICD-10-CM

## 2016-09-20 ENCOUNTER — Encounter: Payer: Self-pay | Admitting: Family Medicine

## 2016-09-22 ENCOUNTER — Ambulatory Visit (HOSPITAL_COMMUNITY)
Admission: RE | Admit: 2016-09-22 | Discharge: 2016-09-22 | Disposition: A | Discharge status: Home / Self Care | Payer: 59 | Source: Ambulatory Visit | Attending: Family Medicine | Admitting: Family Medicine

## 2016-09-22 DIAGNOSIS — M79672 Pain in left foot: Secondary | ICD-10-CM

## 2016-09-22 DIAGNOSIS — R9439 Abnormal result of other cardiovascular function study: Secondary | ICD-10-CM | POA: Insufficient documentation

## 2016-09-22 DIAGNOSIS — I739 Peripheral vascular disease, unspecified: Secondary | ICD-10-CM | POA: Insufficient documentation

## 2016-09-22 DIAGNOSIS — I75022 Atheroembolism of left lower extremity: Secondary | ICD-10-CM | POA: Diagnosis not present

## 2016-09-22 NOTE — Progress Notes (Addendum)
VASCULAR LAB PRELIMINARY  ARTERIAL  ABI completed: Bilateral ABI within normal limits. Bilateral TBI abnormal.    RIGHT    LEFT    PRESSURE WAVEFORM  PRESSURE WAVEFORM  BRACHIAL 153 Tri BRACHIAL 142 Tri  DP   DP    AT 173 Tri AT 152 Tri  PT 171 Tri PT 181 Tri  PER   PER    GREAT TOE 76 NA GREAT TOE 80 NA    RIGHT LEFT  ABI 1.13 1.18   TBI     0.5    0.52     Landry Mellow, RDMS, RVT   09/22/2016, 3:29 PM

## 2016-09-23 ENCOUNTER — Other Ambulatory Visit: Payer: Self-pay | Admitting: Family Medicine

## 2016-09-23 DIAGNOSIS — I75022 Atheroembolism of left lower extremity: Secondary | ICD-10-CM

## 2016-09-23 DIAGNOSIS — I739 Peripheral vascular disease, unspecified: Secondary | ICD-10-CM

## 2016-09-24 ENCOUNTER — Encounter: Payer: Self-pay | Admitting: Family Medicine

## 2016-09-27 ENCOUNTER — Encounter: Payer: Self-pay | Admitting: Vascular Surgery

## 2016-09-28 ENCOUNTER — Encounter: Payer: Self-pay | Admitting: Vascular Surgery

## 2016-09-28 ENCOUNTER — Ambulatory Visit (INDEPENDENT_AMBULATORY_CARE_PROVIDER_SITE_OTHER): Payer: Managed Care, Other (non HMO) | Admitting: Vascular Surgery

## 2016-09-28 VITALS — BP 138/83 | HR 85 | Temp 98.3°F | Resp 16 | Ht 66.0 in | Wt 178.0 lb

## 2016-09-28 DIAGNOSIS — M79672 Pain in left foot: Secondary | ICD-10-CM | POA: Diagnosis not present

## 2016-09-28 DIAGNOSIS — I75022 Atheroembolism of left lower extremity: Secondary | ICD-10-CM

## 2016-09-28 NOTE — Progress Notes (Signed)
[facility]  Patient name: Cassie Lynn MRN: [id number] DOB: February 09, 1971 Sex: female  REASON FOR CONSULT: Discoloration of toes both feet  HPI: Cassie Lynn is a 46 y.o. female, who is seen today for discussion discoloration of the toes of both feet. She is here with her mother. She reports that she has had regressive changes for 6 months. She does have a distinctly discolored left fourth toe and right fifth toe. She reports severe pain associated with this and reports this makes very difficult for her to walk at work. She is quite active going up and down ladders. She does smoke and has so for many years. She is trying to stop smoking but continues to smoke on a daily basis. She denies any prior history of cardiac disease however she does report sensation of fluttering of her heart rhythm and then also reports a sensation of achy heaviness in her right or left arm that then extends to her central chest. She reports this is worse when she is working. She does not have any claudication type symptoms. Does have pain specifically in her feet with walking. On further questioning she does occasionally have bluish discoloration in her fingers as well.  Past Medical History:  Diagnosis Date  . Anemia   . 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 Fibromyalgia   . Migraine    Migraines.  Takes Depakote  . Shortness of breath dyspnea     No family history on file.  SOCIAL HISTORY: Social History   Social History  . Marital status: Single    Spouse name: N/A  . Number of children: N/A  . Years of education: N/A   Occupational History  . Not on file.   Social History Main Topics  . Smoking status: Current Every Day Smoker    Years: 28.00    Types: Cigarettes  . Smokeless tobacco: Never Used     Comment: 1 pack lasts 3 days.  . Alcohol use Yes     Comment: occ   . Drug use: No  . Sexual activity: Not on file    Other Topics Concern  . Not on file   Social History Narrative  . No narrative on file    Allergies  Allergen Reactions  . Vicodin [Hydrocodone-Acetaminophen] Other (See Comments)    Makes her feel like she cannot breath, but no throat swelling. No issues with plain Tylenol    Current Outpatient Prescriptions  Medication Sig Dispense Refill  . diclofenac sodium (VOLTAREN) 1 % GEL Apply 2 g topically 4 (four) times daily. 100 g 0  . DULoxetine (CYMBALTA) 60 MG capsule Take 1 capsule (60 mg total) by mouth daily. 30 capsule 5  . gabapentin (NEURONTIN) 300 MG capsule Take 300 mg by mouth daily with breakfast.   2  . LYRICA 150 MG capsule TAKE ONE CAPSULE BY MOUTH TWICE A DAY 60 capsule 5  . meloxicam (MOBIC) 7.5 MG tablet Take 7.5 mg by mouth every 12 (twelve) hours.  2  . methocarbamol (ROBAXIN) 500 MG tablet Take 1 tablet (500 mg total) by mouth 2 (two) times daily. 20 tablet 0  . topiramate (TOPAMAX) 50 MG tablet TAKE 1 TABLET BY MOUTH DAILY AND 2 TABLETS EVERY NIGHT 90 tablet 2  . SUMAtriptan (IMITREX) 100 MG tablet TAKE 1 TABLET BY MOUTH EVERY 2 HOURS AS NEEDED FOR MIGRAINE. (Patient not taking: Reported on 09/28/2016) 10 tablet 0  No current facility-administered medications for this visit.     REVIEW OF SYSTEMS:  [X]  denotes positive finding, [ ]  denotes negative finding Cardiac  Comments:  Chest pain or chest pressure: x   Shortness of breath upon exertion:    Short of breath when lying flat:    Irregular heart rhythm: x       Vascular    Pain in calf, thigh, or hip brought on by ambulation: x   Pain in feet at night that wakes you up from your sleep:     Blood clot in your veins:    Leg swelling:         Pulmonary    Oxygen at home:    Productive cough:     Wheezing:         Neurologic    Sudden weakness in arms or legs:  x   Sudden numbness in arms or legs:  x   Sudden onset of difficulty speaking or slurred speech:    Temporary loss of vision in one eye:   x   Problems with dizziness:  x       Gastrointestinal    Blood in stool:     Vomited blood:         Genitourinary    Burning when urinating:     Blood in urine:        Psychiatric    Major depression:         Hematologic    Bleeding problems:    Problems with blood clotting too easily:        Skin    Rashes or ulcers:        Constitutional    Fever or chills:      PHYSICAL EXAM: Vitals:   09/28/16 0923  BP: 138/83  Pulse: 85  Resp: 16  Temp: 98.3 F (36.8 C)  TempSrc: Oral  SpO2: 99%  Weight: 178 lb (80.7 kg)  Height: 5\' 6"  (1.676 m)    GENERAL: The patient is a well-nourished female, in no acute distress. The vital signs are documented above. CARDIOVASCULAR: 2+ radial and 2+ dorsalis pedis pulses bilaterally. Carotid arteries without bruits. No abdominal bruits. PULMONARY: There is good air exchange  ABDOMEN: Soft and non-tender  MUSCULOSKELETAL: There are no major deformities or cyanosis. NEUROLOGIC: No focal weakness or paresthesias are detected. SKIN: Mild bluish discoloration on the tips of several of her fingers. Significant cyanosis of her left fourth toe with some thickening and callus formation on the plantar aspect. Also some cyanosis of her right fifth toe with no skin change. PSYCHIATRIC: The patient has a normal affect.  DATA:  Noninvasive vascular studies from 09/22/2016 were reviewed showing normal resting ankle arm index.  MEDICAL ISSUES: Had long discussion with the patient and her mother present. I explained the critical importance of absolute smoking cessation. 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 She does appear to have some element of vasospasm with changes of color in her fingertips as well. I did explain the possibility of embolic phenomenon would be more concerned about this if it was related to her feet only. Since she is having similar changes to her hands would be quite  unlikely that she could have a embolic source. She could have a trial of calcium channel blocker such as Nifedapine if she continues to have difficulty despite smoking cessation  I am concerned regarding her chest pressure related to work and  also with the feeling that is causing an achiness in her arm and then extends into her chest. She will discuss this with Dr.Pickard as well but would certainly consider cardiac evaluation.  She is requesting a release from work due to the pain associated with her walking for one month which I have filled out for her. I explained I do not feel that she is causing any damage she reports that she is unable to work due to pain. She will see [place] again on an as-needed basis. Did explain that in all likelihood her cyanosis will resolve but she may end up with the toe amputation if this becomes infected.   Rosetta Posner, MD FACS [facility] Tel [phone] Pager [phone]

## 2016-10-05 ENCOUNTER — Telehealth: Payer: Self-pay | Admitting: Family Medicine

## 2016-10-05 NOTE — Telephone Encounter (Signed)
Pt dropped off FMLA forms for provider to complete.  Form is completed.  Pt called and told need to collect form fee before we fax to employer.  Pt said she will be in later today to do this.

## 2016-10-05 NOTE — Telephone Encounter (Signed)
Pt has paid from fee.  Completed FMLA forms faxed as requested to [employer]ou at [phone].  Fax confirmation rec'd.

## 2016-10-12 ENCOUNTER — Ambulatory Visit
Admission: RE | Admit: 2016-10-12 | Discharge: 2016-10-12 | Disposition: A | Discharge status: Home / Self Care | Payer: Managed Care, Other (non HMO) | Source: Ambulatory Visit | Attending: Family Medicine | Admitting: Family Medicine

## 2016-10-12 ENCOUNTER — Encounter: Payer: Self-pay | Admitting: Family Medicine

## 2016-10-12 ENCOUNTER — Ambulatory Visit (INDEPENDENT_AMBULATORY_CARE_PROVIDER_SITE_OTHER): Payer: Managed Care, Other (non HMO) | Admitting: Family Medicine

## 2016-10-12 ENCOUNTER — Telehealth: Payer: Self-pay | Admitting: Family Medicine

## 2016-10-12 VITALS — BP 140/80 | HR 78 | Temp 98.2°F | Resp 16 | Ht 66.0 in | Wt 179.0 lb

## 2016-10-12 DIAGNOSIS — I75022 Atheroembolism of left lower extremity: Secondary | ICD-10-CM

## 2016-10-12 DIAGNOSIS — R0789 Other chest pain: Secondary | ICD-10-CM | POA: Diagnosis not present

## 2016-10-12 DIAGNOSIS — I739 Peripheral vascular disease, unspecified: Secondary | ICD-10-CM

## 2016-10-12 DIAGNOSIS — M79671 Pain in right foot: Secondary | ICD-10-CM | POA: Diagnosis not present

## 2016-10-12 NOTE — Progress Notes (Signed)
Subjective:    Patient ID: Cassie Lynn, female    DOB: 1970-07-08, 46 y.o.   MRN: [id number]  HPI  06/18/16 Patient presents with several weeks of pain in her right foot. Pain is localized to the lateral aspect of the fifth MTP joint and the plantar aspect of the fifth MTP joint. In that area the skin is erythematous compared to the surrounding tissue. There is no warmth or swelling. However there is bruising. The laterals aspect of the fifth metatarsal is tender to palpation. There is no plantar's wart or callus or corn in that area. There is no skin breakdown or ulcer. It appears to be a bruise/contusion from chronic pressure.  On examination, I washed the patient walk around the office barefoot. There is no excessive supination of her foot that would explain the bruising.  However she is carrying her weight on her heel and then rolling to her toe to avoid putting pressure in the affected area.  At that time, my plan was: Exam is abnormal but not remarkably so to explain the level of pain the patient is feeling. It appears to be bony tenderness secondary to contusion from chronic pressure. 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 I have recommended that she get a callus pad, cut the center out to create a halo that she can then placed around the tender area to offload the pressure and wear that on a daily basis. She can also apply Voltaren gel 2 g 4 times a day to the affected area. Obtain x-rays of the foot. Recheck in one week or sooner if worse  09/16/16 Xrays were negative.  Patient used the voltaren gel and the pain went away.  However, the "bruise" or purple discoloration over the lateral aspect of the 5th MTP join never went away.  In fact, it has spread onto the dorsum of the foot.  Also, her left fourth toe has now become purple distal to the IP joint and exquisitely tender to palpation.   Both findings are concerning for vascular insufficiency. The left fourth toe has been purple for more than 1 month.  There is a 4 mm bruise on the plantar surface.  Minimal movement causes pain.  At that time, my plan was: I believe the patient has compromised blood flow to the feet causing hypoxic damage. Therefore I'll schedule patient for urgent arterial Dopplers with ABIs and TBI to evaluate further. Meanwhile I'll obtain an x-ray of the left foot to rule out osteomyelitis in the left fourth toe. Patient will likely need to see a vascular surgeon for revascularization options. I will check a cholesterol and will recommend a statin for LDL cholesterol is greater than 70. I recommended the patient begin aspirin 81 mg a day. Also recommended that she quit smoking.  10/12/16 ABI were normal but TBI were abnormal. Has since seen Dr. Donnetta Hutching with vascular surgery.  I have copied his A/P below for my reference:    "Had long discussion with the patient and her mother present. I explained the critical importance of absolute smoking cessation. 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 She does appear to have some element of vasospasm with changes of color in her fingertips as well. I did explain the possibility of embolic phenomenon would be more concerned about this if it was related to  her feet only. Since she is having similar changes to her hands would be quite unlikely that she could have a embolic source. She could have a trial of calcium channel blocker such as Nifedapine if she continues to have difficulty despite smoking cessation  I am concerned regarding her chest pressure related to work and also with the feeling that is causing an achiness in her arm and then extends into her chest. She will discuss this with Dr.Darcel Frane as well but would certainly consider cardiac evaluation.  She is requesting a release from work due to the pain associated  with her walking for one month which I have filled out for her. I explained I do not feel that she is causing any damage she reports that she is unable to work due to pain. She will see [place] again on an as-needed basis. Did explain that in all likelihood her cyanosis will resolve but she may end up with the toe amputation if this becomes infected."  Here for follow up. Patient states that recently she has developed an aching pain that begins around her right elbow radiates up her right arm across her right chest and stops near her sternum. It is an aching pressure-like pain. It can come at rest and with exertion. It lasts 5 minutes and resolve spontaneously. It is associated with some mild shortness of breath. She denies any classic angina. Her gallbladder has been removed. She denies any heartburn or melena. She denies any hemoptysis or coughing   Past Medical History:  Diagnosis Date  . Anemia   . 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 Fibromyalgia   . Migraine    Migraines.  Takes Depakote  . Shortness of breath dyspnea    Past Surgical History:  Procedure Laterality Date  . CESAREAN SECTION    . CHOLECYSTECTOMY    . LUMBAR LAMINECTOMY/DECOMPRESSION MICRODISCECTOMY Left 04/18/2015   Procedure: Left Lumbar five sacral-one  Microdiskectomy;  Surgeon: Kevan Ny Ditty, MD;  Location: [facility];  Service: Neurosurgery;  Laterality: Left;   Current Outpatient Prescriptions on File Prior to Visit  Medication Sig Dispense Refill  . diclofenac sodium (VOLTAREN) 1 % GEL Apply 2 g topically 4 (four) times daily. 100 g 0  . DULoxetine (CYMBALTA) 60 MG capsule Take 1 capsule (60 mg total) by mouth daily. 30 capsule 5  . gabapentin (NEURONTIN) 300 MG capsule Take 300 mg by mouth daily with breakfast.   2  . LYRICA 150 MG capsule TAKE ONE CAPSULE BY MOUTH TWICE A DAY 60 capsule 5  . meloxicam (MOBIC) 7.5 MG tablet Take 7.5 mg by mouth every 12 (twelve) hours.  2  . methocarbamol (ROBAXIN)  500 MG tablet Take 1 tablet (500 mg total) by mouth 2 (two) times daily. 20 tablet 0  . SUMAtriptan (IMITREX) 100 MG tablet TAKE 1 TABLET BY MOUTH EVERY 2 HOURS AS NEEDED FOR MIGRAINE. (Patient not taking: Reported on 09/28/2016) 10 tablet 0  . topiramate (TOPAMAX) 50 MG tablet TAKE 1 TABLET BY MOUTH DAILY AND 2 TABLETS EVERY NIGHT 90 tablet 2   No current facility-administered medications on file prior to visit.    Allergies  Allergen Reactions  . Vicodin [Hydrocodone-Acetaminophen] Other (See Comments)    Makes her feel like she cannot breath, but no throat swelling. No issues with plain Tylenol   Social History   Social History  . Marital status: Single    Spouse name: N/A  . Number of children:  N/A  . Years of education: N/A   Occupational History  . Not on file.   Social History Main Topics  . Smoking status: Current Every Day Smoker    Years: 28.00    Types: Cigarettes  . Smokeless tobacco: Never Used     Comment: 1 pack lasts 3 days.  . Alcohol use Yes     Comment: occ   . Drug use: No  . Sexual activity: Not on file   Other Topics Concern  . Not on file   Social History Narrative  . No narrative on file      Review of Systems  All other systems reviewed and are negative.      Objective:   Physical Exam  Cardiovascular: Normal rate, regular rhythm and normal heart sounds.   Pulses:      Dorsalis pedis pulses are 1+ on the right side, and 1+ on the left side.       Posterior tibial pulses are 1+ on the right side, and 1+ on the left side.  Pulmonary/Chest: Effort normal and breath sounds normal. No respiratory distress. She has no wheezes. She has no rales.  Musculoskeletal:       Right foot: There is tenderness, bony tenderness, decreased capillary refill and deformity. There is normal range of motion and no swelling.       Left foot: There is tenderness, bony tenderness, swelling, decreased capillary refill and deformity.       Feet:  Vitals  reviewed.         Assessment & Plan:  Atypical chest pain - Plan: DG Chest 2 View  Chest pain sounds very atypical. However given her microvascular disease, her smoking, and her shortness of breath, I believe a cardiology workup is warranted. I will refer the patient to cardiology for stress test. I will also asked the patient to get a chest x-ray given her smoking history. However I believe the pain is likely neuropathic related to cervical radiculopathy. Await results of cardiology consultation and if pain persists, and chest x-ray is normal, and cardiology workup is normal, I would workup neuropathic pain from possible cervical radiculopathy. Continue to strongly encourage smoking cessation as the patient continues to smoke.  EKG today shows normal sinus rhythm with poor R-wave progression.  However there is no evidence of ischemia or infarction.

## 2016-10-19 NOTE — Telephone Encounter (Signed)
error 

## 2016-10-21 ENCOUNTER — Telehealth: Payer: Self-pay | Admitting: Family Medicine

## 2016-10-21 NOTE — Telephone Encounter (Signed)
Forms filled out via Dr. Dennard Schaumann, ov and test's from 10/12/16 to present printed and faxed to [facility] at [phone]

## 2016-11-19 ENCOUNTER — Ambulatory Visit: Payer: Managed Care, Other (non HMO) | Admitting: Cardiovascular Disease

## 2017-02-23 ENCOUNTER — Other Ambulatory Visit: Payer: Self-pay | Admitting: Family Medicine

## 2017-02-23 NOTE — Telephone Encounter (Signed)
Ok to refill 

## 2017-02-24 NOTE — Telephone Encounter (Signed)
Med list shows lyrica and gabapentin.  Needs to verify and stop one.  Med list also shows methocarbamol 500 and she is requesting 750.  Need to update medlist and then refill is ok.

## 2017-03-02 NOTE — Telephone Encounter (Signed)
LMTRC

## 2017-03-09 ENCOUNTER — Other Ambulatory Visit: Payer: Self-pay | Admitting: Family Medicine

## 2017-03-09 MED ORDER — SUMATRIPTAN SUCCINATE 100 MG PO TABS: ORAL_TABLET | ORAL | 3 refills | Status: DC

## 2017-03-09 MED ORDER — MELOXICAM 7.5 MG PO TABS: 7.5000 mg | ORAL_TABLET | Freq: Two times a day (BID) | ORAL | 3 refills | Status: DC

## 2017-03-09 MED ORDER — METHOCARBAMOL 750 MG PO TABS: ORAL_TABLET | ORAL | 2 refills | Status: DC

## 2017-03-09 MED ORDER — GABAPENTIN 300 MG PO CAPS: ORAL_CAPSULE | ORAL | 3 refills | Status: DC

## 2017-03-09 NOTE — Telephone Encounter (Signed)
See refill note - pt is on Robaxin 750mg  - rx sent. Pt is only taking Gabapentin and not Lyrica d/t cost of that medication. Med list updated.

## 2017-05-30 ENCOUNTER — Other Ambulatory Visit: Payer: Self-pay | Admitting: Family Medicine

## 2017-06-28 ENCOUNTER — Other Ambulatory Visit: Payer: Self-pay | Admitting: Family Medicine

## 2017-07-26 ENCOUNTER — Ambulatory Visit: Payer: Managed Care, Other (non HMO) | Admitting: Cardiovascular Disease

## 2017-07-26 ENCOUNTER — Encounter: Payer: Self-pay | Admitting: Cardiovascular Disease

## 2017-07-26 VITALS — BP 162/62 | HR 64 | Ht 67.0 in | Wt 169.8 lb

## 2017-07-26 DIAGNOSIS — R002 Palpitations: Secondary | ICD-10-CM | POA: Diagnosis not present

## 2017-07-26 DIAGNOSIS — R0789 Other chest pain: Secondary | ICD-10-CM

## 2017-07-26 NOTE — Assessment & Plan Note (Signed)
History of atypical chest pain over the last several months occurring weekly lasting minutes at a time.  The pain occurs in both arms as well as in her chest.  Is not brought on by any particular activity.  It is associated with occasional shortness of breath.  She has no risk factors except tobacco abuse when she is trying to quit.  Her pain is somewhat atypical.  I am going to get a routine GXT to further evaluate.

## 2017-07-26 NOTE — Progress Notes (Signed)
07/26/2017 Cassie Lynn   1970/09/02  [id number]  Primary Physician Pickard, Cammie Mcgee, MD Primary Cardiologist: Lorretta Harp MD Garret Reddish, [place], [place]  HPI:  Cassie Lynn is a 47 y.o. thin appearing single Caucasian female mother of 43, grandmother of 5 grandchildren referred by Dr. Dennard Schaumann for cardiovascular evaluation because of palpitation and atypical chest pain.  Risk factors include 30 pack years of tobacco abuse now smoking 1 pack every 5 days.  Otherwise she has no risk factors.  There is no family history for heart disease.  She is never had a heart attack or stroke.  She works in a [employer].  She said palpitation for years occurring on a daily basis.  She does drink 1 cup of coffee a day.  Over the last 3 to 4 months she has had atypical chest pain occurring on a weekly basis lasting minutes at a time with pain in both arms as well.   Current Meds  Medication Sig  . gabapentin (NEURONTIN) 300 MG capsule TAKE 1 CAPSULE BY MOUTH THREE TIMES A DAY  . meloxicam (MOBIC) 7.5 MG tablet TAKE 1 TABLET BY MOUTH EVERY 12 HOURS.  . methocarbamol (ROBAXIN) 750 MG tablet TAKE 1 TABLET BY MOUTH EVERY 6 HOURS AS NEEDED FOR MUSCLE SPASM  . SUMAtriptan (IMITREX) 100 MG tablet TAKE 1 TABLET BY MOUTH EVERY 2 HOURS AS NEEDED FOR MIGRAINE.  Marland Kitchen topiramate (TOPAMAX) 50 MG tablet TAKE 1 TABLET BY MOUTH DAILY AND 2 TABLETS EVERY NIGHT     Allergies  Allergen Reactions  . Vicodin [Hydrocodone-Acetaminophen] Other (See Comments)    Makes her feel like she cannot breath, but no throat swelling. No issues with plain Tylenol    Social History   Socioeconomic History  . Marital status: Single    Spouse name: Not on file  . Number of children: Not on file  . Years of education: Not on file  . Highest education level: Not on file  Occupational History  . Not on file  Social Needs  . Financial resource strain: Not on file  . Food insecurity:    Worry: Not on file    Inability: Not on  file  . Transportation needs:    Medical: Not on file    Non-medical: Not on file  Tobacco Use  . Smoking status: Current Every Day Smoker    Years: 28.00    Types: Cigarettes  . Smokeless tobacco: Never Used  . Tobacco comment: 1 pack lasts 3 days.  Substance and Sexual Activity  . Alcohol use: Yes    Comment: occ   . Drug use: No  . Sexual activity: Not on file  Lifestyle  . Physical activity:    Days per week: Not on file    Minutes per session: Not on file  . Stress: Not on file  Relationships  . Social connections:    Talks on phone: Not on file    Gets together: Not on file    Attends religious service: Not on file    Active member of club or organization: Not on file    Attends meetings of clubs or organizations: Not on file    Relationship status: Not on file  . Intimate partner violence:    Fear of current or ex partner: Not on file    Emotionally abused: Not on file    Physically abused: Not on file    Forced sexual activity: Not on file  Other Topics  Concern  . Not on file  Social History Narrative  . Not on file     Review of Systems: General: negative for chills, fever, night sweats or weight changes.  Cardiovascular: negative for chest pain, dyspnea on exertion, edema, orthopnea, palpitations, paroxysmal nocturnal dyspnea or shortness of breath Dermatological: negative for rash Respiratory: negative for cough or wheezing Urologic: negative for hematuria Abdominal: negative for nausea, vomiting, diarrhea, bright red blood per rectum, melena, or hematemesis Neurologic: negative for visual changes, syncope, or dizziness All other systems reviewed and are otherwise negative except as noted above.    Blood pressure (!) 162/62, pulse 64, height 5\' 7"  (1.702 m), weight 169 lb 12.8 oz (77 kg).  General appearance: alert and no distress Neck: no adenopathy, no carotid bruit, no JVD, supple, symmetrical, trachea midline and thyroid not enlarged, symmetric, no  tenderness/mass/nodules Lungs: clear to auscultation bilaterally Heart: regular rate and rhythm, S1, S2 normal, no murmur, click, rub or gallop Extremities: extremities normal, atraumatic, no cyanosis or edema Pulses: 2+ and symmetric Skin: Skin color, texture, turgor normal. No rashes or lesions Neurologic: Alert and oriented X 3, normal strength and tone. Normal symmetric reflexes. Normal coordination and gait  EKG sinus rhythm at 64 without ST or T wave changes.  I personally reviewed this EKG.  ASSESSMENT AND PLAN:   Palpitations Ms. Vallie complains of palpitations that occur fairly frequently that have occurred for the last several years.  Drinks 1 cup of coffee a day.  I am going to get a 2-week event monitor to further evaluate.  Atypical chest pain History of atypical chest pain over the last several months occurring weekly lasting minutes at a time.  The pain occurs in both arms as well as in her chest.  Is not brought on by any particular activity.  It is associated with occasional shortness of breath.  She has no risk factors except tobacco abuse when she is trying to quit.  Her pain is somewhat atypical.  I am going to get a routine GXT to further evaluate.      Lorretta Harp MD FACP,FACC,FAHA, [facility] 07/26/2017 11:50 AM

## 2017-07-26 NOTE — Assessment & Plan Note (Signed)
Cassie Lynn complains of palpitations that occur fairly frequently that have occurred for the last several years.  Drinks 1 cup of coffee a day.  I am going to get a 2-week event monitor to further evaluate.

## 2017-07-26 NOTE — Patient Instructions (Signed)
Medication Instructions: Your physician recommends that you continue on your current medications as directed. Please refer to the Current Medication list given to you today.   Testing/Procedures: Your physician has recommended that you wear a 14 day event monitor. Event monitors are medical devices that record the heart's electrical activity. Doctors most often [place] these monitors to diagnose arrhythmias. Arrhythmias are problems with the speed or rhythm of the heartbeat. The monitor is a small, portable device. You can wear one while you do your normal daily activities. This is usually used to diagnose what is causing palpitations/syncope (passing out).  Your physician has requested that you have an exercise tolerance test. For further information please visit [email or web address]. Please also follow instruction sheet, as given.  Follow-Up: Your physician recommends that you schedule a follow-up appointment as needed with Dr. Gwenlyn Found.

## 2017-07-29 ENCOUNTER — Telehealth (HOSPITAL_COMMUNITY): Payer: Self-pay

## 2017-07-29 NOTE — Telephone Encounter (Signed)
Encounter complete. 

## 2017-08-01 ENCOUNTER — Other Ambulatory Visit: Payer: Self-pay | Admitting: Family Medicine

## 2017-08-02 ENCOUNTER — Telehealth (HOSPITAL_COMMUNITY): Payer: Self-pay

## 2017-08-02 ENCOUNTER — Ambulatory Visit (INDEPENDENT_AMBULATORY_CARE_PROVIDER_SITE_OTHER): Payer: Managed Care, Other (non HMO)

## 2017-08-02 DIAGNOSIS — R002 Palpitations: Secondary | ICD-10-CM

## 2017-08-02 DIAGNOSIS — R0789 Other chest pain: Secondary | ICD-10-CM | POA: Diagnosis not present

## 2017-08-02 NOTE — Telephone Encounter (Signed)
Encounter complete. 

## 2017-08-03 ENCOUNTER — Encounter (HOSPITAL_COMMUNITY): Payer: Self-pay | Admitting: *Deleted

## 2017-08-03 ENCOUNTER — Encounter: Payer: Self-pay | Admitting: Cardiovascular Disease

## 2017-08-03 ENCOUNTER — Other Ambulatory Visit: Payer: Self-pay | Admitting: Cardiovascular Disease

## 2017-08-03 ENCOUNTER — Ambulatory Visit (HOSPITAL_COMMUNITY)
Admission: RE | Admit: 2017-08-03 | Discharge: 2017-08-03 | Disposition: A | Discharge status: Home / Self Care | Payer: Managed Care, Other (non HMO) | Source: Ambulatory Visit | Attending: Cardiology | Admitting: Cardiology

## 2017-08-03 DIAGNOSIS — R0789 Other chest pain: Secondary | ICD-10-CM

## 2017-08-03 DIAGNOSIS — R002 Palpitations: Secondary | ICD-10-CM | POA: Diagnosis not present

## 2017-08-03 DIAGNOSIS — R9439 Abnormal result of other cardiovascular function study: Secondary | ICD-10-CM

## 2017-08-03 DIAGNOSIS — R9430 Abnormal result of cardiovascular function study, unspecified: Secondary | ICD-10-CM | POA: Insufficient documentation

## 2017-08-03 LAB — EXERCISE TOLERANCE TEST
CHL CUP MPHR: 174 {beats}/min
CHL CUP RESTING HR STRESS: 68 {beats}/min
CHL RATE OF PERCEIVED EXERTION: 17
CSEPEDS: 47 s
CSEPPHR: 173 {beats}/min
Estimated workload: 8.1 METS
Exercise duration (min): 6 min
Percent HR: 99 %

## 2017-08-03 MED ORDER — METOPROLOL TARTRATE 50 MG PO TABS: ORAL_TABLET | ORAL | 0 refills | Status: DC

## 2017-08-03 NOTE — Progress Notes (Signed)
Dr. Gwenlyn Found reviewed abnormal ETT and ordered CTA scan. Stated that pt may leave.

## 2017-08-31 ENCOUNTER — Other Ambulatory Visit: Payer: Self-pay | Admitting: Family Medicine

## 2017-10-04 ENCOUNTER — Emergency Department (HOSPITAL_COMMUNITY)
Admission: EM | Admit: 2017-10-04 | Discharge: 2017-10-04 | Disposition: A | Discharge status: Home / Self Care | Payer: Managed Care, Other (non HMO) | Attending: Emergency Medicine | Admitting: Emergency Medicine

## 2017-10-04 ENCOUNTER — Encounter (HOSPITAL_COMMUNITY): Payer: Self-pay | Admitting: *Deleted

## 2017-10-04 DIAGNOSIS — M25512 Pain in left shoulder: Secondary | ICD-10-CM | POA: Diagnosis not present

## 2017-10-04 DIAGNOSIS — Z79899 Other long term (current) drug therapy: Secondary | ICD-10-CM | POA: Diagnosis not present

## 2017-10-04 DIAGNOSIS — Z9049 Acquired absence of other specified parts of digestive tract: Secondary | ICD-10-CM | POA: Insufficient documentation

## 2017-10-04 DIAGNOSIS — R202 Paresthesia of skin: Secondary | ICD-10-CM | POA: Insufficient documentation

## 2017-10-04 DIAGNOSIS — M542 Cervicalgia: Secondary | ICD-10-CM | POA: Insufficient documentation

## 2017-10-04 DIAGNOSIS — F17210 Nicotine dependence, cigarettes, uncomplicated: Secondary | ICD-10-CM | POA: Insufficient documentation

## 2017-10-04 MED ORDER — ACETAMINOPHEN 500 MG PO TABS
1000.0000 mg | ORAL_TABLET | Freq: Once | ORAL | Status: AC
  Administered 2017-10-04: 1000 mg via ORAL
  Filled 2017-10-04: qty 2

## 2017-10-04 MED ORDER — METHOCARBAMOL 750 MG PO TABS: 750.0000 mg | ORAL_TABLET | Freq: Three times a day (TID) | ORAL | 0 refills | Status: DC | PRN
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
## 2017-10-04 NOTE — Discharge Instructions (Signed)
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
## 2017-10-05 ENCOUNTER — Other Ambulatory Visit: Payer: Self-pay | Admitting: Family Medicine

## 2017-10-06 ENCOUNTER — Encounter: Payer: Self-pay | Admitting: Family Medicine

## 2017-10-06 ENCOUNTER — Ambulatory Visit: Payer: Managed Care, Other (non HMO) | Admitting: Family Medicine

## 2017-10-06 ENCOUNTER — Ambulatory Visit
Admission: RE | Admit: 2017-10-06 | Discharge: 2017-10-06 | Disposition: A | Discharge status: Home / Self Care | Payer: Managed Care, Other (non HMO) | Source: Ambulatory Visit | Attending: Family Medicine | Admitting: Family Medicine

## 2017-10-06 VITALS — BP 138/90 | HR 76 | Temp 98.3°F | Resp 18 | Ht 66.0 in | Wt 170.0 lb

## 2017-10-06 DIAGNOSIS — M542 Cervicalgia: Secondary | ICD-10-CM

## 2017-10-06 MED ORDER — PREDNISONE 20 MG PO TABS: ORAL_TABLET | ORAL | 0 refills | Status: DC
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
## 2017-10-10 ENCOUNTER — Ambulatory Visit (HOSPITAL_COMMUNITY): Payer: Managed Care, Other (non HMO)

## 2017-10-13 ENCOUNTER — Ambulatory Visit (HOSPITAL_COMMUNITY)
Admission: RE | Admit: 2017-10-13 | Discharge: 2017-10-13 | Disposition: A | Discharge status: Home / Self Care | Payer: Managed Care, Other (non HMO) | Source: Ambulatory Visit | Attending: Cardiovascular Disease | Admitting: Cardiovascular Disease

## 2017-10-13 ENCOUNTER — Ambulatory Visit (HOSPITAL_COMMUNITY)
Admission: RE | Admit: 2017-10-13 | Discharge: 2017-10-13 | Disposition: A | Discharge status: Home / Self Care | Payer: Managed Care, Other (non HMO) | Source: Ambulatory Visit | Attending: Cardiology | Admitting: Cardiology

## 2017-10-13 ENCOUNTER — Encounter (HOSPITAL_COMMUNITY): Payer: Self-pay | Admitting: Radiology

## 2017-10-13 ENCOUNTER — Other Ambulatory Visit (HOSPITAL_COMMUNITY): Payer: Self-pay | Admitting: Cardiology

## 2017-10-13 DIAGNOSIS — R0789 Other chest pain: Secondary | ICD-10-CM

## 2017-10-13 DIAGNOSIS — I878 Other specified disorders of veins: Secondary | ICD-10-CM

## 2017-10-13 DIAGNOSIS — R9439 Abnormal result of other cardiovascular function study: Secondary | ICD-10-CM

## 2017-10-13 HISTORY — PX: IR RADIOLOGY PERIPHERAL GUIDED IV START: IMG5598

## 2017-10-13 HISTORY — PX: IR US GUIDE VASC ACCESS RIGHT: IMG2390

## 2017-10-13 MED ORDER — LIDOCAINE HCL (PF) 1 % IJ SOLN
INTRAMUSCULAR | Status: DC | PRN
  Administered 2017-10-13: 5 mL

## 2017-10-13 MED ORDER — NITROGLYCERIN 0.4 MG SL SUBL
SUBLINGUAL_TABLET | SUBLINGUAL | Status: AC
  Filled 2017-10-13: qty 2

## 2017-10-13 MED ORDER — IOPAMIDOL (ISOVUE-370) INJECTION 76%
100.0000 mL | Freq: Once | INTRAVENOUS | Status: AC | PRN
  Administered 2017-10-13: 100 mL via INTRAVENOUS

## 2017-10-13 MED ORDER — LIDOCAINE HCL 1 % IJ SOLN
INTRAMUSCULAR | Status: DC
Start: 2017-10-13 — End: 2017-10-14
  Filled 2017-10-13: qty 20

## 2017-10-13 MED ORDER — NITROGLYCERIN 0.4 MG SL SUBL
0.8000 mg | SUBLINGUAL_TABLET | Freq: Once | SUBLINGUAL | Status: AC
  Administered 2017-10-13: 0.8 mg via SUBLINGUAL
  Filled 2017-10-13: qty 25

## 2017-10-13 NOTE — Procedures (Signed)
  Scheduled for Cardiac CTA  IV start using US guidance 4 Fr micropuncture   Right basilic vein access  No complication

## 2017-10-31 ENCOUNTER — Ambulatory Visit: Payer: Managed Care, Other (non HMO) | Admitting: Family Medicine

## 2017-10-31 ENCOUNTER — Encounter: Payer: Self-pay | Admitting: Family Medicine

## 2017-10-31 VITALS — BP 172/90 | HR 99 | Temp 98.3°F | Resp 16 | Ht 66.0 in | Wt 177.0 lb

## 2017-10-31 DIAGNOSIS — M542 Cervicalgia: Secondary | ICD-10-CM

## 2017-10-31 DIAGNOSIS — G44209 Tension-type headache, unspecified, not intractable: Secondary | ICD-10-CM

## 2017-10-31 DIAGNOSIS — B079 Viral wart, unspecified: Secondary | ICD-10-CM

## 2017-10-31 DIAGNOSIS — I10 Essential (primary) hypertension: Secondary | ICD-10-CM

## 2017-10-31 MED ORDER — LOSARTAN POTASSIUM 50 MG PO TABS: 50.0000 mg | ORAL_TABLET | Freq: Every day | ORAL | 3 refills | Status: DC

## 2017-10-31 MED ORDER — TIZANIDINE HCL 4 MG PO TABS: 4.0000 mg | ORAL_TABLET | Freq: Four times a day (QID) | ORAL | 0 refills | Status: DC | PRN
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
## 2017-11-02 LAB — CBC WITH DIFFERENTIAL/PLATELET
BASOS PCT: 0.8 %
Basophils Absolute: 94 cells/uL (ref 0–200)
EOS ABS: 484 {cells}/uL (ref 15–500)
EOS PCT: 4.1 %
HCT: 28.7 % — ABNORMAL LOW (ref 35.0–45.0)
Hemoglobin: 9.5 g/dL — ABNORMAL LOW (ref 11.7–15.5)
Lymphs Abs: 3564 cells/uL (ref 850–3900)
MCH: 27.3 pg (ref 27.0–33.0)
MCHC: 33.1 g/dL (ref 32.0–36.0)
MCV: 82.5 fL (ref 80.0–100.0)
MONOS PCT: 5.4 %
MPV: 11.7 fL (ref 7.5–12.5)
NEUTROS ABS: 7021 {cells}/uL (ref 1500–7800)
Neutrophils Relative %: 59.5 %
Platelets: 258 10*3/uL (ref 140–400)
RBC: 3.48 10*6/uL — AB (ref 3.80–5.10)
RDW: 14.2 % (ref 11.0–15.0)
Total Lymphocyte: 30.2 %
WBC mixed population: 637 cells/uL (ref 200–950)
WBC: 11.8 10*3/uL — ABNORMAL HIGH (ref 3.8–10.8)

## 2017-11-02 LAB — BASIC METABOLIC PANEL WITH GFR
BUN: 19 mg/dL (ref 7–25)
CALCIUM: 8.6 mg/dL (ref 8.6–10.2)
CHLORIDE: 111 mmol/L — AB (ref 98–110)
CO2: 19 mmol/L — AB (ref 20–32)
Creat: 0.84 mg/dL (ref 0.50–1.10)
GFR, Est African American: 96 mL/min/{1.73_m2} (ref >=60)
GFR, Est Non African American: 83 mL/min/{1.73_m2} (ref >=60)
GLUCOSE: 98 mg/dL (ref 65–99)
Potassium: 3.8 mmol/L (ref 3.5–5.3)
Sodium: 138 mmol/L (ref 135–146)

## 2017-11-02 LAB — VITAMIN B12: VITAMIN B 12: 321 pg/mL (ref 200–1100)

## 2017-11-02 LAB — TEST AUTHORIZATION

## 2017-11-02 LAB — IRON,TIBC AND FERRITIN PANEL
%SAT: 3 % (calc) — ABNORMAL LOW (ref 16–45)
Ferritin: 6 ng/mL — ABNORMAL LOW (ref 16–232)
IRON: 13 ug/dL — AB (ref 40–190)
TIBC: 456 ug/dL — AB (ref 250–450)

## 2017-11-03 ENCOUNTER — Encounter: Payer: Self-pay | Admitting: Family Medicine

## 2017-11-03 ENCOUNTER — Ambulatory Visit: Payer: Managed Care, Other (non HMO) | Admitting: Physician Assistant

## 2017-11-03 ENCOUNTER — Encounter: Payer: Self-pay | Admitting: Physician Assistant

## 2017-11-03 VITALS — BP 160/90 | HR 107 | Temp 98.5°F | Resp 16 | Ht 66.0 in | Wt 176.6 lb

## 2017-11-03 DIAGNOSIS — D500 Iron deficiency anemia secondary to blood loss (chronic): Secondary | ICD-10-CM | POA: Diagnosis not present

## 2017-11-03 DIAGNOSIS — N921 Excessive and frequent menstruation with irregular cycle: Secondary | ICD-10-CM | POA: Diagnosis not present

## 2017-11-03 NOTE — Progress Notes (Signed)
Patient ID: TOSCA PLETZ MRN: [id number], DOB: [birth year], 47 y.o. Date of Encounter: @DATE @  Chief Complaint:  Chief Complaint  Patient presents with  . follow up lab work  . menstrual cycle on for 3 weeks    large clots     HPI: 47 y.o. year old female  presents with above.   Recently had office visit here with Dr. Dennard Schaumann on 10/31/2017. At that visit, was evaluated regarding headache, hypertension, viral warts on her skin. As part of that evaluation, he obtained labs, including CBC. CBC revealed anemia with Hgb 9.5.  Subsequently B12 iron TIBC and ferritin were added to further evaluate.  B12 was normal.  Iron and ferritin are low and high TIBC. I just reviewed these results earlier today and recommended patient to return to office for further evaluation today. She now presents for that follow-up.  She reports that she has been having menstrual blood clots.  This is the first time this is ever happened.  Orts that usually her menses only last for 3 or 4 days and come every 28 days and are very regular and with very normal amount of bleeding. However states that this menses has occurred continued for 3 weeks. States that she is been passing large blood clots. At the beginning of this menses 3 weeks ago she had a large blood clot then bled like a normal.  Then passed another blood clot then the bleeding seemed to slow down but then passed another blood clot and then blood would gush out behind the clot.  States that this is continued for 3 weeks now.  States that she goes into work between 7 AM to 8 AM depending on how busy things are.  Offered about 5:30 PM.  States that she takes with her to work 7 large pads.  These of the large nighttime pads.  Dates that in that 10-hour.  She goes through all 7 pads.  Dates that she will be at work and will feel a large clot pass and then will go to the bathroom and remove that pad in place a new one.  This is happening basically every hour.  That  she is feeling weak and lightheaded. Has no established gynecologist.  States that her youngest child is age 47 and she has seen no GYN since.  Past Medical History:  Diagnosis Date  . Anemia   . 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 Fibromyalgia   . Migraine    Migraines.  Takes Depakote  . Shortness of breath dyspnea      Home Meds: Outpatient Medications Prior to Visit  Medication Sig Dispense Refill  . gabapentin (NEURONTIN) 300 MG capsule TAKE 1 CAPSULE BY MOUTH THREE TIMES A DAY 270 capsule 0  . losartan (COZAAR) 50 MG tablet Take 1 tablet (50 mg total) by mouth daily. 90 tablet 3  . meloxicam (MOBIC) 7.5 MG tablet TAKE 1 TABLET BY MOUTH EVERY 12 HOURS. 60 tablet 0  . methocarbamol (ROBAXIN) 750 MG tablet Take 1-2 tablets (750-1,500 mg total) by mouth 3 (three) times daily as needed for muscle spasms. 18 tablet 0  . SUMAtriptan (IMITREX) 100 MG tablet TAKE 1 TABLET BY MOUTH EVERY 2 HOURS AS NEEDED FOR MIGRAINE. 10 tablet 3  . tiZANidine (ZANAFLEX) 4 MG tablet Take 1 tablet (4 mg total) by mouth every 6 (six) hours as needed (headache). 30 tablet 0  . topiramate (TOPAMAX) 50 MG tablet TAKE 1 TABLET  BY MOUTH DAILY AND 2 TABLETS EVERY NIGHT 90 tablet 2   No facility-administered medications prior to visit.     Allergies:  Allergies  Allergen Reactions  . Vicodin [Hydrocodone-Acetaminophen] Other (See Comments)    Makes her feel like she cannot breath, but no throat swelling. No issues with plain Tylenol    Social History   Socioeconomic History  . Marital status: Single    Spouse name: Not on file  . Number of children: Not on file  . Years of education: Not on file  . Highest education level: Not on file  Occupational History  . Not on file  Social Needs  . Financial resource strain: Not on file  . Food insecurity:    Worry: Not on file    Inability: Not on file  . Transportation needs:    Medical: Not on file    Non-medical: Not on file  Tobacco Use    . Smoking status: Current Every Day Smoker    Years: 28.00    Types: Cigarettes  . Smokeless tobacco: Never Used  . Tobacco comment: 1 pack lasts 3 days.  Substance and Sexual Activity  . Alcohol use: Yes    Comment: occ   . Drug use: No  . Sexual activity: Not on file  Lifestyle  . Physical activity:    Days per week: Not on file    Minutes per session: Not on file  . Stress: Not on file  Relationships  . Social connections:    Talks on phone: Not on file    Gets together: Not on file    Attends religious service: Not on file    Active member of club or organization: Not on file    Attends meetings of clubs or organizations: Not on file    Relationship status: Not on file  . Intimate partner violence:    Fear of current or ex partner: Not on file    Emotionally abused: Not on file    Physically abused: Not on file    Forced sexual activity: Not on file  Other Topics Concern  . Not on file  Social History Narrative  . Not on file    History reviewed. No pertinent family history.   Review of Systems:  See HPI for pertinent ROS. All other ROS negative.    Physical Exam: Blood pressure (!) 160/90, pulse (!) 107, temperature 98.5 F (36.9 C), temperature source Oral, resp. rate 16, height 5\' 6"  (1.676 m), weight 80.1 kg, last menstrual period 10/20/2017, SpO2 99 %., Body mass index is 28.5 kg/m. General: WNWD WF. Appears in no acute distress. Neck: Supple. No thyromegaly. No lymphadenopathy. Lungs: Clear bilaterally to auscultation without wheezes, rales, or rhonchi. Breathing is unlabored. Heart: RRR with S1 S2. No murmurs, rubs, or gallops. Musculoskeletal:  Strength and tone normal for age. Extremities/Skin: Warm and dry.  Neuro: Alert and oriented X 3. Moves all extremities spontaneously. Gait is normal. CNII-XII grossly in tact. Psych:  Responds to questions appropriately with a normal affect.     ASSESSMENT AND PLAN:  47 y.o. year old female with   1. Iron  deficiency anemia due to chronic blood loss 2. Menometrorrhagia I have placed referral to GYN and have marked this is urgent.  I have talked to our referral staff about the case and she is in the process of scheduling appointment with GYN currently. She is scheduled at [facility] for Tuesday, September 17. I am giving Ms.  Karlton Lemon a note for out of work today through Tuesday, September 17. 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  Is standing on concrete packing boxes constant. Discussed that if bleeding increases or she has increased weakness or develops lightheadedness/presyncope then go to the emergency room. - Ambulatory referral to Obstetrics / Gynecology     Signed, [address] [place], [place], [facility] 11/03/2017 3:00 PM

## 2017-12-09 ENCOUNTER — Encounter: Payer: Self-pay | Admitting: Family Medicine

## 2017-12-09 ENCOUNTER — Ambulatory Visit: Payer: Managed Care, Other (non HMO) | Admitting: Family Medicine

## 2017-12-09 VITALS — BP 124/78 | HR 77 | Temp 97.8°F | Resp 15 | Ht 66.0 in | Wt 178.0 lb

## 2017-12-09 DIAGNOSIS — R231 Pallor: Secondary | ICD-10-CM | POA: Diagnosis not present

## 2017-12-09 MED ORDER — AMLODIPINE BESYLATE 10 MG PO TABS: 10.0000 mg | ORAL_TABLET | Freq: Every day | ORAL | 3 refills | Status: DC

## 2017-12-09 NOTE — Progress Notes (Addendum)
Subjective:    Patient ID: Cassie Lynn, female    DOB: December 01, 1970, 47 y.o.   MRN: [id number]  HPI  06/18/16 Patient presents with several weeks of pain in her right foot. Pain is localized to the lateral aspect of the fifth MTP joint and the plantar aspect of the fifth MTP joint. In that area the skin is erythematous compared to the surrounding tissue. There is no warmth or swelling. However there is bruising. The laterals aspect of the fifth metatarsal is tender to palpation. There is no plantar's wart or callus or corn in that area. There is no skin breakdown or ulcer. It appears to be a bruise/contusion from chronic pressure.  On examination, I washed the patient walk around the office barefoot. There is no excessive supination of her foot that would explain the bruising.  However she is carrying her weight on her heel and then rolling to her toe to avoid putting pressure in the affected area.  At that time, my plan was: Exam is abnormal but not remarkably so to explain the level of pain the patient is feeling. It appears to be bony tenderness secondary to contusion from chronic pressure. 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 I have recommended that she get a callus pad, cut the center out to create a halo that she can then placed around the tender area to offload the pressure and wear that on a daily basis. She can also apply Voltaren gel 2 g 4 times a day to the affected area. Obtain x-rays of the foot. Recheck in one week or sooner if worse  09/16/16 Xrays were negative.  Patient used the voltaren gel and the pain went away.  However, the "bruise" or purple discoloration over the lateral aspect of the 5th MTP join never went away.  In fact, it has spread onto the dorsum of the foot.  Also, her left fourth toe has now become purple distal to the IP joint and exquisitely tender to palpation.   Both findings are concerning for vascular insufficiency. The left fourth toe has been purple for more than 1 month.  There is a 4 mm bruise on the plantar surface.  Minimal movement causes pain.  At that time, my plan was: I believe the patient has compromised blood flow to the feet causing hypoxic damage. Therefore I'll schedule patient for urgent arterial Dopplers with ABIs and TBI to evaluate further. Meanwhile I'll obtain an x-ray of the left foot to rule out osteomyelitis in the left fourth toe. Patient will likely need to see a vascular surgeon for revascularization options. I will check a cholesterol and will recommend a statin for LDL cholesterol is greater than 70. I recommended the patient begin aspirin 81 mg a day. Also recommended that she quit smoking.  12/09/17 ABIs were normal.  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  Patient was referred to vascular surgery who recommended smoking cessation and following up as needed.  If the wound on her toe did not heal she may require amputation.  Fortunately the purplish discoloration gradually improved although still present and there is no evidence of necrosis in the toe today. Patient presents today with a rash on both legs.  The rash is mottled skin.  There is streaking purplish discoloration to the skin in a lacy reticular pattern.  It involves her anterior shins, her anterior  and lateral thighs bilaterally.  It is also prominent on both forearms.  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  She does report leg pain and toe pain and finger pain worse during cold weather consistent with claudication such as Raynaud's phenomenon or Buerger's disease.  There is no evidence of ischemia today.  There is no purplish discoloration to the fingertips or toes aside from the fifth toe as previously mentioned.   Past Medical History:  Diagnosis Date  . Anemia   . 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 Fibromyalgia   .  Migraine    Migraines.  Takes Depakote  . Shortness of breath dyspnea    Past Surgical History:  Procedure Laterality Date  . CESAREAN SECTION    . CHOLECYSTECTOMY    . IR RADIOLOGY PERIPHERAL GUIDED IV START  10/13/2017  . IR US GUIDE VASC ACCESS RIGHT  10/13/2017  . LUMBAR LAMINECTOMY/DECOMPRESSION MICRODISCECTOMY Left 04/18/2015   Procedure: Left Lumbar five sacral-one  Microdiskectomy;  Surgeon: Kevan Ny Ditty, MD;  Location: [facility];  Service: Neurosurgery;  Laterality: Left;   Current Outpatient Medications on File Prior to Visit  Medication Sig Dispense Refill  . gabapentin (NEURONTIN) 300 MG capsule TAKE 1 CAPSULE BY MOUTH THREE TIMES A DAY 270 capsule 0  . losartan (COZAAR) 50 MG tablet Take 1 tablet (50 mg total) by mouth daily. 90 tablet 3  . meloxicam (MOBIC) 7.5 MG tablet TAKE 1 TABLET BY MOUTH EVERY 12 HOURS. 60 tablet 0  . methocarbamol (ROBAXIN) 750 MG tablet Take 1-2 tablets (750-1,500 mg total) by mouth 3 (three) times daily as needed for muscle spasms. 18 tablet 0  . SUMAtriptan (IMITREX) 100 MG tablet TAKE 1 TABLET BY MOUTH EVERY 2 HOURS AS NEEDED FOR MIGRAINE. 10 tablet 3  . tiZANidine (ZANAFLEX) 4 MG tablet Take 1 tablet (4 mg total) by mouth every 6 (six) hours as needed (headache). 30 tablet 0  . topiramate (TOPAMAX) 50 MG tablet TAKE 1 TABLET BY MOUTH DAILY AND 2 TABLETS EVERY NIGHT 90 tablet 2   No current facility-administered medications on file prior to visit.    Allergies  Allergen Reactions  . Vicodin [Hydrocodone-Acetaminophen] Other (See Comments)    Makes her feel like she cannot breath, but no throat swelling. No issues with plain Tylenol   Social History   Socioeconomic History  . Marital status: Single    Spouse name: Not on file  . Number of children: Not on file  . Years of education: Not on file  . Highest education level: Not on file  Occupational History  . Not on file  Social Needs  . Financial resource strain: Not on file    . Food insecurity:    Worry: Not on file    Inability: Not on file  . Transportation needs:    Medical: Not on file    Non-medical: Not on file  Tobacco Use  . Smoking status: Current Every Day Smoker    Years: 28.00    Types: Cigarettes  . Smokeless tobacco: Never Used  . Tobacco comment: 1 pack lasts 3 days.  Substance and Sexual Activity  . Alcohol use: Yes    Comment: occ   . Drug use: No  . Sexual activity: Not on file  Lifestyle  . Physical activity:    Days per week: Not on file    Minutes per session: Not on file  . Stress: Not on file  Relationships  .  Social connections:    Talks on phone: Not on file    Gets together: Not on file    Attends religious service: Not on file    Active member of club or organization: Not on file    Attends meetings of clubs or organizations: Not on file    Relationship status: Not on file  . Intimate partner violence:    Fear of current or ex partner: Not on file    Emotionally abused: Not on file    Physically abused: Not on file    Forced sexual activity: Not on file  Other Topics Concern  . Not on file  Social History Narrative  . Not on file      Review of Systems  All other systems reviewed and are negative.      Objective:   Vital signs are reviewed Cardiovascular regular rate and rhythm with no murmurs rubs or gallops.  Pulmonary clear to auscultation bilaterally with no wheezes crackles or rails.  I reviewed the vascular studies from her previous visit last year.  She has palpable pulses in both feet as well as in the popliteal fossa bilaterally.  There is no necrotic ulcers on any toe.  There is no evidence of gangrene.  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 Again with some basic lab work including a sedimentation rate, and ANA, and ANCA, as well as cryoglobulin levels.  Discontinue losartan and replace with amlodipine 10 mg a day for possible vasospasms.  Strongly encourage smoking cessation.  Continue aspirin.  Await the results of the lab work.  Addendum 12/12/17 So far, only sedimentation rate has returned and was normal.  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  I question if the livedo reticularis could be another symptom of this underlying disorder.  Further research that I have done suggest that we need to rule out scleroderma by checking an anticentromere antibody as well as an SCL70.  Research also suggest that I should rule out hypercoagulability including an antiphospholipid antibody, and Endya cardiolipin antibody, factor V Leiden mutation, prothrombin, protein C deficiency, protein S deficiency.  I will await the results of the vasculitis work-up as well as the work-up for cryoglobulinemia.  If these are negative, I will discuss the above-mentioned studies with the patient as well as see her response to amlodipine.  Could also consider nifedipine extended release 30 mg a day gradually uptitrated to a maximum of 120 mg a day.  We will continue to encourage smoking cessation as this would be the crux of treatment

## 2017-12-12 LAB — SEDIMENTATION RATE: Sed Rate: 9 mm/h (ref 0–20)

## 2017-12-12 LAB — ANA: ANA: NEGATIVE

## 2017-12-12 LAB — ANCA SCREEN W REFLEX TITER: ANCA Screen: NEGATIVE

## 2017-12-15 LAB — CRYOGLOBULIN: CRYOGLOBULIN, QUALITATIVE ANALYSIS: NOT DETECTED

## 2017-12-29 ENCOUNTER — Other Ambulatory Visit: Payer: Self-pay | Admitting: Family Medicine

## 2017-12-29 ENCOUNTER — Ambulatory Visit: Payer: Managed Care, Other (non HMO) | Admitting: Family Medicine

## 2017-12-29 ENCOUNTER — Encounter: Payer: Self-pay | Admitting: Family Medicine

## 2017-12-29 VITALS — BP 160/90 | HR 82 | Temp 98.3°F | Resp 14 | Wt 181.0 lb

## 2017-12-29 DIAGNOSIS — I731 Thromboangiitis obliterans [Buerger's disease]: Secondary | ICD-10-CM | POA: Diagnosis not present

## 2017-12-29 MED ORDER — VARENICLINE TARTRATE 0.5 MG X 11 & 1 MG X 42 PO MISC: ORAL | 0 refills | Status: DC

## 2017-12-29 NOTE — Progress Notes (Signed)
Subjective:    Patient ID: Cassie Lynn, female    DOB: Dec 07, 1970, 47 y.o.   MRN: [id number]  Hypertension     06/18/16 Patient presents with several weeks of pain in her right foot. Pain is localized to the lateral aspect of the fifth MTP joint and the plantar aspect of the fifth MTP joint. In that area the skin is erythematous compared to the surrounding tissue. There is no warmth or swelling. However there is bruising. The laterals aspect of the fifth metatarsal is tender to palpation. There is no plantar's wart or callus or corn in that area. There is no skin breakdown or ulcer. It appears to be a bruise/contusion from chronic pressure.  On examination, I washed the patient walk around the office barefoot. There is no excessive supination of her foot that would explain the bruising.  However she is carrying her weight on her heel and then rolling to her toe to avoid putting pressure in the affected area.  At that time, my plan was: Exam is abnormal but not remarkably so to explain the level of pain the patient is feeling. It appears to be bony tenderness secondary to contusion from chronic pressure. 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 I have recommended that she get a callus pad, cut the center out to create a halo that she can then placed around the tender area to offload the pressure and wear that on a daily basis. She can also apply Voltaren gel 2 g 4 times a day to the affected area. Obtain x-rays of the foot. Recheck in one week or sooner if worse  09/16/16 Xrays were negative.  Patient used the voltaren gel and the pain went away.  However, the "bruise" or purple discoloration over the lateral aspect of the 5th MTP join never went away.  In fact, it has spread onto the dorsum of the foot.  Also, her left fourth toe has now become purple distal to the IP joint and exquisitely tender  to palpation.  Both findings are concerning for vascular insufficiency. The left fourth toe has been purple for more than 1 month.  There is a 4 mm bruise on the plantar surface.  Minimal movement causes pain.  At that time, my plan was: I believe the patient has compromised blood flow to the feet causing hypoxic damage. Therefore I'll schedule patient for urgent arterial Dopplers with ABIs and TBI to evaluate further. Meanwhile I'll obtain an x-ray of the left foot to rule out osteomyelitis in the left fourth toe. Patient will likely need to see a vascular surgeon for revascularization options. I will check a cholesterol and will recommend a statin for LDL cholesterol is greater than 70. I recommended the patient begin aspirin 81 mg a day. Also recommended that she quit smoking.  12/09/17 ABIs were normal.  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  Patient was referred to vascular surgery who recommended smoking cessation and following up as needed.  If the wound on her toe did not heal she may require amputation.  Fortunately the purplish discoloration gradually improved although still present and there is no evidence of necrosis in the toe today. Patient presents today with a rash on both legs.  The rash is mottled skin.  There is streaking purplish discoloration to the skin in a lacy reticular pattern.  It involves her anterior  shins, her anterior and lateral thighs bilaterally.  It is also prominent on both forearms.  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  She does report leg pain and toe pain and finger pain worse during cold weather consistent with claudication such as Raynaud's phenomenon or Buerger's disease.  There is no evidence of ischemia today.  There is no purplish discoloration to the fingertips or toes aside from the fifth toe as previously mentioned.  At that time, my plan was: Differential diagnosis includes Buerger's disease, autoimmune vasculitis, lupus,  cryoglobulinemia. Again with some basic lab work including a sedimentation rate, and ANA, and ANCA, as well as cryoglobulin levels.  Discontinue losartan and replace with amlodipine 10 mg a day for possible vasospasms.  Strongly encourage smoking cessation.  Continue aspirin.  Await the results of the lab work.  Addendum 12/12/17 So far, only sedimentation rate has returned and was normal.  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  I question if the livedo reticularis could be another symptom of this underlying disorder.  Further research that I have done suggest that we need to rule out scleroderma by checking an anticentromere antibody as well as an SCL70.  Research also suggest that I should rule out hypercoagulability including an antiphospholipid antibody, and anticardiolipin antibody, factor V Leiden mutation, prothrombin, protein C deficiency, protein S deficiency.  I will await the results of the vasculitis work-up as well as the work-up for cryoglobulinemia.  If these are negative, I will discuss the above-mentioned studies with the patient as well as see her response to amlodipine.  Could also consider nifedipine extended release 30 mg a day gradually uptitrated to a maximum of 120 mg a day.  We will continue to encourage smoking cessation as this would be the crux of treatment  12/29/17 Patient is here today to discuss further.  She continues to demonstrate a rash on her arms and her legs consistent with livedo reticularis area and she has not seen any improvement since starting amlodipine.  Unfortunately her blood pressure is extremely high despite being on amlodipine.  It appears as though she needs to resume losartan as well.  He does report a family history of blood clots in both her mother and and her sister raising the  concern for hypercoagulable state.  She also continues to demonstrate and reports symptoms consistent with Raynaud's phenomenon raising the concern for scleroderma.  Vasculitis work-up was negative.  There is no evidence of cryoglobulinemia.  Office Visit on 12/09/2017  Component Date Value Ref Range Status  . ANCA Screen 12/09/2017 NEGATIVE  NEGATIVE Final   Comment: ANCA Screen includes evaluation for p-ANCA, c-ANCA and atypical p-ANCA. A positive ANCA screen reflexes to titer and pattern(s), e.g., cytoplasmic pattern (c-ANCA), perinuclear pattern (p-ANCA), or atypical p-ANCA pattern.  c-ANCA and p-ANCA are observed in vasculitis, whereas atypical p-ANCA is observed in IBD (Inflammatory Bowel Disease). Atypical p-ANCA is  detected in about 55% to 80% of patients with ulcerative colitis but only 5% to 25% of patients with Crohn's disease. .   . Anti Nuclear Antibody(ANA) 12/09/2017 NEGATIVE  NEGATIVE Final   Comment: ANA IFA is a first line screen for detecting the presence of up to approximately 150 autoantibodies in various autoimmune diseases. A negative ANA IFA result suggests an ANA-associated autoimmune disease is not present at this time, but is  not definitive. If there is high clinical suspicion for Sjogren's syndrome, testing for anti-SS-A/Ro antibody should be considered. Anti-Jo-1 antibody should be considered for clinically suspected inflammatory myopathies. . AC-0: Negative . International Consensus on ANA Patterns (https://www.hernandez-brewer.com/) . For additional information, please refer to http://education.[facility].com/faq/FAQ177 (This link is being provided for informational/ educational purposes only.) .   Marland Kitchen Sed Rate 12/09/2017 9  0 - 20 mm/h Final  . Cryoglobulin, Qualitative Analysis 12/09/2017 None Detected  None Detec Final    Past Medical History:  Diagnosis Date  . Anemia   . 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  Fibromyalgia   . Migraine    Migraines.  Takes Depakote  . Shortness of breath dyspnea    Past Surgical History:  Procedure Laterality Date  . CESAREAN SECTION    . CHOLECYSTECTOMY    . IR RADIOLOGY PERIPHERAL GUIDED IV START  10/13/2017  . IR US GUIDE VASC ACCESS RIGHT  10/13/2017  . LUMBAR LAMINECTOMY/DECOMPRESSION MICRODISCECTOMY Left 04/18/2015   Procedure: Left Lumbar five sacral-one  Microdiskectomy;  Surgeon: Kevan Ny Ditty, MD;  Location: [facility];  Service: Neurosurgery;  Laterality: Left;   Current Outpatient Medications on File Prior to Visit  Medication Sig Dispense Refill  . amLODipine (NORVASC) 10 MG tablet Take 1 tablet (10 mg total) by mouth daily. 90 tablet 3  . gabapentin (NEURONTIN) 300 MG capsule TAKE 1 CAPSULE BY MOUTH THREE TIMES A DAY 270 capsule 0  . losartan (COZAAR) 50 MG tablet Take 1 tablet (50 mg total) by mouth daily. 90 tablet 3  . meloxicam (MOBIC) 7.5 MG tablet TAKE 1 TABLET BY MOUTH EVERY 12 HOURS. 60 tablet 0  . methocarbamol (ROBAXIN) 750 MG tablet Take 1-2 tablets (750-1,500 mg total) by mouth 3 (three) times daily as needed for muscle spasms. 18 tablet 0  . SUMAtriptan (IMITREX) 100 MG tablet TAKE 1 TABLET BY MOUTH EVERY 2 HOURS AS NEEDED FOR MIGRAINE. 10 tablet 3  . tiZANidine (ZANAFLEX) 4 MG tablet Take 1 tablet (4 mg total) by mouth every 6 (six) hours as needed (headache). 30 tablet 0  . topiramate (TOPAMAX) 50 MG tablet TAKE 1 TABLET BY MOUTH DAILY AND 2 TABLETS EVERY NIGHT 90 tablet 2   No current facility-administered medications on file prior to visit.    Allergies  Allergen Reactions  . Vicodin [Hydrocodone-Acetaminophen] Other (See Comments)    Makes her feel like she cannot breath, but no throat swelling. No issues with plain Tylenol   Social History   Socioeconomic History  . Marital status: Single    Spouse name: Not on file  . Number of children: Not on file  . Years of education: Not on file  . Highest education level:  Not on file  Occupational History  . Not on file  Social Needs  . Financial resource strain: Not on file  . Food insecurity:    Worry: Not on file    Inability: Not on file  . Transportation needs:    Medical: Not on file    Non-medical: Not on file  Tobacco Use  . Smoking status: Current Every Day Smoker    Years: 28.00    Types: Cigarettes  . Smokeless tobacco: Never Used  . Tobacco comment: 1 pack lasts 3 days.  Substance and Sexual Activity  . Alcohol use: Yes    Comment: occ   . Drug use: No  . Sexual activity: Not on file  Lifestyle  . Physical  activity:    Days per week: Not on file    Minutes per session: Not on file  . Stress: Not on file  Relationships  . Social connections:    Talks on phone: Not on file    Gets together: Not on file    Attends religious service: Not on file    Active member of club or organization: Not on file    Attends meetings of clubs or organizations: Not on file    Relationship status: Not on file  . Intimate partner violence:    Fear of current or ex partner: Not on file    Emotionally abused: Not on file    Physically abused: Not on file    Forced sexual activity: Not on file  Other Topics Concern  . Not on file  Social History Narrative  . Not on file      Review of Systems  All other systems reviewed and are negative.      Objective:   Vital signs are reviewed Cardiovascular regular rate and rhythm with no murmurs rubs or gallops.  Pulmonary clear to auscultation bilaterally with no wheezes crackles or rails.  I reviewed the vascular studies from her previous visit last year.  She has palpable pulses in both feet as well as in the popliteal fossa bilaterally.  There is no necrotic ulcers on any toe.  There is no evidence of gangrene.  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  There is no evidence of vasculitis causing her livedo reticularis.  Therefore we need to rule out hypercoagulable states as well as scleroderma.  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  I have strongly recommended some smoking cessation.  Continue amlodipine 10 mg a day.  Use Chantix to help with smoking cessation.  Resume losartan in addition to the amlodipine due to her elevated blood pressure.  I will check anti-scleroderma antibody.  I will check for anticardiolipin and lupus anticoagulant.  I will also check for protein C&S deficiency, factor V Leiden mutation, prothrombin gene mutation to rule out hypercoagulable state.

## 2017-12-29 NOTE — Addendum Note (Signed)
Addended by: Shary Decamp B on: 12/29/2017 12:32 PM   Modules accepted: Orders

## 2017-12-30 LAB — PROTEIN S ACTIVITY: Protein S Activity: 69 % (ref 60–140)

## 2017-12-30 LAB — PROTEIN C ACTIVITY: PROTEIN C ACTIVITY: 72 % (ref 70–180)

## 2018-01-04 LAB — FACTOR 5 LEIDEN

## 2018-01-05 LAB — ANTIPHOSPHOLIPID SYNDROME DIAGNOSTIC PANEL
Anticardiolipin IgA: <11 [APL'U] (ref <=11)
Anticardiolipin IgG: <14 [GPL'U] (ref <=14)
Anticardiolipin IgM: <12 [MPL'U] (ref <=12)
Beta-2 Glyco 1 IgA: <9 SAU (ref <=20)
PTT-LA Screen: 44 s — ABNORMAL HIGH (ref <=40)
dRVVT: 30 s (ref <=45)

## 2018-01-05 LAB — ANTI-SCLERODERMA ANTIBODY: Scleroderma (Scl-70) (ENA) Antibody, IgG: <1 AI

## 2018-01-05 LAB — TIQ-MISC

## 2018-01-05 LAB — EXTRA LAV TOP TUBE

## 2018-01-05 LAB — RFLX HEXAGONAL PHASE CONFIRM: Hexagonal Phase Conf: NEGATIVE

## 2018-07-01 ENCOUNTER — Other Ambulatory Visit: Payer: Self-pay | Admitting: Family Medicine

## 2018-10-11 ENCOUNTER — Other Ambulatory Visit: Payer: Self-pay | Admitting: Family Medicine

## 2018-10-25 ENCOUNTER — Other Ambulatory Visit: Payer: Self-pay | Admitting: Family Medicine

## 2018-11-10 ENCOUNTER — Other Ambulatory Visit: Payer: Self-pay | Admitting: Family Medicine

## 2018-11-10 ENCOUNTER — Ambulatory Visit (INDEPENDENT_AMBULATORY_CARE_PROVIDER_SITE_OTHER): Payer: Managed Care, Other (non HMO) | Admitting: Family Medicine

## 2018-11-10 ENCOUNTER — Encounter: Payer: Self-pay | Admitting: Family Medicine

## 2018-11-10 ENCOUNTER — Other Ambulatory Visit: Payer: Self-pay

## 2018-11-10 VITALS — BP 150/72 | HR 74 | Temp 98.8°F | Resp 18 | Ht 66.0 in | Wt 181.0 lb

## 2018-11-10 DIAGNOSIS — R079 Chest pain, unspecified: Secondary | ICD-10-CM

## 2018-11-10 DIAGNOSIS — G459 Transient cerebral ischemic attack, unspecified: Secondary | ICD-10-CM | POA: Diagnosis not present

## 2018-11-10 MED ORDER — CLOPIDOGREL BISULFATE 75 MG PO TABS: 75.0000 mg | ORAL_TABLET | Freq: Every day | ORAL | 3 refills | Status: DC

## 2018-11-10 MED ORDER — ROSUVASTATIN CALCIUM 20 MG PO TABS: 20.0000 mg | ORAL_TABLET | Freq: Every day | ORAL | 3 refills | Status: DC

## 2018-11-10 NOTE — Progress Notes (Signed)
Subjective:    Patient ID: Cassie Lynn, female    DOB: 1971-02-18, 48 y.o.   MRN: [id number]  Hypertension    06/18/16 Patient presents with several weeks of pain in her right foot. Pain is localized to the lateral aspect of the fifth MTP joint and the plantar aspect of the fifth MTP joint. In that area the skin is erythematous compared to the surrounding tissue. There is no warmth or swelling. However there is bruising. The laterals aspect of the fifth metatarsal is tender to palpation. There is no plantar's wart or callus or corn in that area. There is no skin breakdown or ulcer. It appears to be a bruise/contusion from chronic pressure.  On examination, I washed the patient walk around the office barefoot. There is no excessive supination of her foot that would explain the bruising.  However she is carrying her weight on her heel and then rolling to her toe to avoid putting pressure in the affected area.  At that time, my plan was: Exam is abnormal but not remarkably so to explain the level of pain the patient is feeling. It appears to be bony tenderness secondary to contusion from chronic pressure. 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 I have recommended that she get a callus pad, cut the center out to create a halo that she can then placed around the tender area to offload the pressure and wear that on a daily basis. She can also apply Voltaren gel 2 g 4 times a day to the affected area. Obtain x-rays of the foot. Recheck in one week or sooner if worse  09/16/16 Xrays were negative.  Patient used the voltaren gel and the pain went away.  However, the "bruise" or purple discoloration over the lateral aspect of the 5th MTP join never went away.  In fact, it has spread onto the dorsum of the foot.  Also, her left fourth toe has now become purple distal to the IP joint and exquisitely tender  to palpation.  Both findings are concerning for vascular insufficiency. The left fourth toe has been purple for more than 1 month.  There is a 4 mm bruise on the plantar surface.  Minimal movement causes pain.  At that time, my plan was: I believe the patient has compromised blood flow to the feet causing hypoxic damage. Therefore I'll schedule patient for urgent arterial Dopplers with ABIs and TBI to evaluate further. Meanwhile I'll obtain an x-ray of the left foot to rule out osteomyelitis in the left fourth toe. Patient will likely need to see a vascular surgeon for revascularization options. I will check a cholesterol and will recommend a statin for LDL cholesterol is greater than 70. I recommended the patient begin aspirin 81 mg a day. Also recommended that she quit smoking.  12/09/17 ABIs were normal.  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  Patient was referred to vascular surgery who recommended smoking cessation and following up as needed.  If the wound on her toe did not heal she may require amputation.  Fortunately the purplish discoloration gradually improved although still present and there is no evidence of necrosis in the toe today. Patient presents today with a rash on both legs.  The rash is mottled skin.  There is streaking purplish discoloration to the skin in a lacy reticular pattern.  It involves her anterior shins,  her anterior and lateral thighs bilaterally.  It is also prominent on both forearms.  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  She does report leg pain and toe pain and finger pain worse during cold weather consistent with claudication such as Raynaud's phenomenon or Buerger's disease.  There is no evidence of ischemia today.  There is no purplish discoloration to the fingertips or toes aside from the fifth toe as previously mentioned.  At that time, my plan was: Differential diagnosis includes Buerger's disease, autoimmune vasculitis, lupus,  cryoglobulinemia. Again with some basic lab work including a sedimentation rate, and ANA, and ANCA, as well as cryoglobulin levels.  Discontinue losartan and replace with amlodipine 10 mg a day for possible vasospasms.  Strongly encourage smoking cessation.  Continue aspirin.  Await the results of the lab work.  Addendum 12/12/17 So far, only sedimentation rate has returned and was normal.  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  I question if the livedo reticularis could be another symptom of this underlying disorder.  Further research that I have done suggest that we need to rule out scleroderma by checking an anticentromere antibody as well as an SCL70.  Research also suggest that I should rule out hypercoagulability including an antiphospholipid antibody, and anticardiolipin antibody, factor V Leiden mutation, prothrombin, protein C deficiency, protein S deficiency.  I will await the results of the vasculitis work-up as well as the work-up for cryoglobulinemia.  If these are negative, I will discuss the above-mentioned studies with the patient as well as see her response to amlodipine.  Could also consider nifedipine extended release 30 mg a day gradually uptitrated to a maximum of 120 mg a day.  We will continue to encourage smoking cessation as this would be the crux of treatment  12/29/17 Patient is here today to discuss further.  She continues to demonstrate a rash on her arms and her legs consistent with livedo reticularis area and she has not seen any improvement since starting amlodipine.  Unfortunately her blood pressure is extremely high despite being on amlodipine.  It appears as though she needs to resume losartan as well.  He does report a family history of blood clots in both her mother and and her sister raising the  concern for hypercoagulable state.  She also continues to demonstrate and reports symptoms consistent with Raynaud's phenomenon raising the concern for scleroderma.  Vasculitis work-up was negative.  There is no evidence of cryoglobulinemia.  At that time, my plan was: Spent approximately 25 minutes today with the patient discussing the situation.  There is no evidence of vasculitis causing her livedo reticularis.  Therefore we need to rule out hypercoagulable states as well as scleroderma.  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  I have strongly recommended some smoking cessation.  Continue amlodipine 10 mg a day.  Use Chantix to help with smoking cessation.  Resume losartan in addition to the amlodipine due to her elevated blood pressure.  I will check anti-scleroderma antibody.  I will check for anticardiolipin and lupus anticoagulant.  I will also check for protein C&S deficiency, factor V Leiden mutation, prothrombin gene mutation to rule out hypercoagulable state.  11/10/18 Last week the patient had 2 concerning episodes.  The first she was sitting on a couch having eaten some spicy  hot wings when suddenly she developed pain in the center of her chest.  She developed nausea.  She developed pain in her left arm.  She felt short of breath.  She felt diaphoretic and sweaty.  Symptoms gradually improved over a few minutes.  Of note, the patient had an abnormal stress test last year and finally underwent a coronary artery CT scan which showed 0 blockages in the coronary arteries.  This was performed in August 2019.  EKG today shows normal sinus rhythm and other than poor R wave progression in the precordial leads is completely normal.  However what is more concerning for me is the episode the patient had later in the week.  She states that she was just sitting there doing nothing when suddenly she felt numbness on the right side of her face.  Her right arm went numb and  limp.  She was unable to move her right arm.  She had a difficult time speaking.  She was unable to move her lips.  She was unable to make any words.  This was witnessed by a colleague at work.  Symptoms lasted seconds to a minute and then resolve spontaneously and gradually.  The patient then went to work.  However she scheduled an appointment today to discuss.  She has been on aspirin prior to the event.  She is also taking meloxicam.  She has not taken them in the last 6 months.  She does have a history of migraine headaches for which she takes Topamax.  She denies any further episodes of chest pain.  She denies any shortness of breath.  She denies any further neurologic deficits.  Past Medical History:  Diagnosis Date  . Anemia   . Aneurysm ([place])    brain- 2015ish was gone  . 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 Fibromyalgia   . Migraine    Migraines.  Takes Depakote  . Shortness of breath dyspnea    Past Surgical History:  Procedure Laterality Date  . CESAREAN SECTION    . CHOLECYSTECTOMY    . IR RADIOLOGY PERIPHERAL GUIDED IV START  10/13/2017  . IR US GUIDE VASC ACCESS RIGHT  10/13/2017  . LUMBAR LAMINECTOMY/DECOMPRESSION MICRODISCECTOMY Left 04/18/2015   Procedure: Left Lumbar five sacral-one  Microdiskectomy;  Surgeon: Kevan Ny Ditty, MD;  Location: [facility];  Service: Neurosurgery;  Laterality: Left;   Current Outpatient Medications on File Prior to Visit  Medication Sig Dispense Refill  . amLODipine (NORVASC) 10 MG tablet TAKE 1 TABLET BY MOUTH EVERY DAY 30 tablet 0  . gabapentin (NEURONTIN) 300 MG capsule TAKE 1 CAPSULE BY MOUTH THREE TIMES A DAY 270 capsule 0  . losartan (COZAAR) 100 MG tablet TAKE 1/2 TABLET BY MOUTH EVERY DAY 90 tablet 1  . meloxicam (MOBIC) 7.5 MG tablet TAKE 1 TABLET BY MOUTH EVERY 12 HOURS 60 tablet 0  . methocarbamol (ROBAXIN) 750 MG tablet Take 1-2 tablets (750-1,500 mg total) by mouth 3 (three) times daily as needed for  muscle spasms. 18 tablet 0  . SUMAtriptan (IMITREX) 100 MG tablet TAKE 1 TABLET BY MOUTH EVERY 2 HOURS AS NEEDED FOR MIGRAINE. 10 tablet 3  . topiramate (TOPAMAX) 50 MG tablet TAKE 1 TABLET BY MOUTH DAILY AND 2 TABLETS EVERY NIGHT 90 tablet 2   No current facility-administered medications on file prior to visit.    Allergies  Allergen Reactions  . Vicodin [Hydrocodone-Acetaminophen] Other (See Comments)    Makes her  feel like she cannot breath, but no throat swelling. No issues with plain Tylenol   Social History   Socioeconomic History  . Marital status: Single    Spouse name: Not on file  . Number of children: Not on file  . Years of education: Not on file  . Highest education level: Not on file  Occupational History  . Not on file  Social Needs  . Financial resource strain: Not on file  . Food insecurity    Worry: Not on file    Inability: Not on file  . Transportation needs    Medical: Not on file    Non-medical: Not on file  Tobacco Use  . Smoking status: Current Every Day Smoker    Years: 28.00    Types: Cigarettes  . Smokeless tobacco: Never Used  . Tobacco comment: 1 pack lasts 3 days.  Substance and Sexual Activity  . Alcohol use: Yes    Comment: occ   . Drug use: No  . Sexual activity: Not on file  Lifestyle  . Physical activity    Days per week: Not on file    Minutes per session: Not on file  . Stress: Not on file  Relationships  . Social Herbalist on phone: Not on file    Gets together: Not on file    Attends religious service: Not on file    Active member of club or organization: Not on file    Attends meetings of clubs or organizations: Not on file    Relationship status: Not on file  . Intimate partner violence    Fear of current or ex partner: Not on file    Emotionally abused: Not on file    Physically abused: Not on file    Forced sexual activity: Not on file  Other Topics Concern  . Not on file  Social History Narrative  .  Not on file      Review of Systems  All other systems reviewed and are negative.      Objective:   Vital signs are reviewed Cardiovascular regular rate and rhythm with no murmurs rubs or gallops.  Pulmonary clear to auscultation bilaterally with no wheezes crackles or rails.  Cranial nerves II through XII are grossly intact with muscle strength 5/5 equal and symmetric in the upper and lower extremities.  The patient has normal speech.  She has normal cerebellar exam.  She has normal gait.  She has normal reflexes.        Assessment & Plan:   Chest pain, unspecified type - Plan: EKG 12-Lead  TIA (transient ischemic attack) - Plan: CBC with Differential/Platelet, COMPLETE METABOLIC PANEL WITH GFR, Lipid panel   EKG today is completely normal.  I do not believe her chest pain was cardiac in nature given the fact she had a normal coronary artery CT scan last year.  However I am concerned that the patient suffered a TIA with right hemiparesis and speech difficulties.  Her ABCD score is 3 which gives a 2-day stroke risk of less than 1% and is considered low.  Therefore I believe the patient can be worked up as an outpatient.  I will arrange outpatient consultation with neurology.  Meanwhile I will schedule the patient for an MRI of the brain, and MRA of the head and an MRA of the neck to evaluate for any flow-limiting lesions in the cerebral circulation.  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  Meanwhile switch aspirin to Plavix 75 mg a day.  Discontinue meloxicam.  Start Crestor 20 mg a day.  Stop smoking.  Recommended the patient not use any triptan given the increased risk of stroke.  Await results of the above work-up.  Return fasting for a CBC, CMP, and fasting lipid panel

## 2018-11-13 ENCOUNTER — Encounter: Payer: Self-pay | Admitting: Neurology

## 2018-11-14 ENCOUNTER — Other Ambulatory Visit: Payer: Managed Care, Other (non HMO)

## 2018-11-14 ENCOUNTER — Other Ambulatory Visit: Payer: Self-pay

## 2018-11-15 LAB — CBC WITH DIFFERENTIAL/PLATELET
Absolute Monocytes: 954 cells/uL — ABNORMAL HIGH (ref 200–950)
Basophils Absolute: 144 cells/uL (ref 0–200)
Basophils Relative: 0.8 %
Eosinophils Absolute: 558 cells/uL — ABNORMAL HIGH (ref 15–500)
Eosinophils Relative: 3.1 %
HCT: 32.4 % — ABNORMAL LOW (ref 35.0–45.0)
Hemoglobin: 9.5 g/dL — ABNORMAL LOW (ref 11.7–15.5)
Lymphs Abs: 3168 cells/uL (ref 850–3900)
MCH: 21.6 pg — ABNORMAL LOW (ref 27.0–33.0)
MCHC: 29.3 g/dL — ABNORMAL LOW (ref 32.0–36.0)
MCV: 73.8 fL — ABNORMAL LOW (ref 80.0–100.0)
MPV: 11 fL (ref 7.5–12.5)
Monocytes Relative: 5.3 %
Neutro Abs: 13176 cells/uL — ABNORMAL HIGH (ref 1500–7800)
Neutrophils Relative %: 73.2 %
Platelets: 355 10*3/uL (ref 140–400)
RBC: 4.39 10*6/uL (ref 3.80–5.10)
RDW: 18.3 % — ABNORMAL HIGH (ref 11.0–15.0)
Total Lymphocyte: 17.6 %
WBC: 18 10*3/uL — ABNORMAL HIGH (ref 3.8–10.8)

## 2018-11-15 LAB — COMPLETE METABOLIC PANEL WITH GFR
AG Ratio: 1.5 (calc) (ref 1.0–2.5)
ALT: 12 U/L (ref 6–29)
AST: 14 U/L (ref 10–35)
Albumin: 4 g/dL (ref 3.6–5.1)
Alkaline phosphatase (APISO): 45 U/L (ref 31–125)
BUN: 15 mg/dL (ref 7–25)
CO2: 18 mmol/L — ABNORMAL LOW (ref 20–32)
Calcium: 9.1 mg/dL (ref 8.6–10.2)
Chloride: 111 mmol/L — ABNORMAL HIGH (ref 98–110)
Creat: 0.72 mg/dL (ref 0.50–1.10)
GFR, Est African American: 115 mL/min/{1.73_m2} (ref >=60)
GFR, Est Non African American: 99 mL/min/{1.73_m2} (ref >=60)
Globulin: 2.7 g/dL (calc) (ref 1.9–3.7)
Glucose, Bld: 84 mg/dL (ref 65–99)
Potassium: 4.3 mmol/L (ref 3.5–5.3)
Sodium: 141 mmol/L (ref 135–146)
Total Bilirubin: 0.3 mg/dL (ref 0.2–1.2)
Total Protein: 6.7 g/dL (ref 6.1–8.1)

## 2018-11-15 LAB — LIPID PANEL
Cholesterol: 134 mg/dL (ref <200)
HDL: 40 mg/dL — ABNORMAL LOW (ref >=50)
LDL Cholesterol (Calc): 75 mg/dL (calc)
Non-HDL Cholesterol (Calc): 94 mg/dL (calc) (ref <130)
Total CHOL/HDL Ratio: 3.4 (calc) (ref <5.0)
Triglycerides: 108 mg/dL (ref <150)

## 2018-11-17 ENCOUNTER — Other Ambulatory Visit: Payer: Self-pay | Admitting: Family Medicine

## 2018-11-17 DIAGNOSIS — D72828 Other elevated white blood cell count: Secondary | ICD-10-CM

## 2018-11-18 ENCOUNTER — Other Ambulatory Visit: Payer: Self-pay | Admitting: Family Medicine

## 2018-11-20 NOTE — Progress Notes (Signed)
NEUROLOGY CONSULTATION NOTE  Cassie Lynn MRN: [id number] DOB: 1970-03-03  Referring provider: Jenna Luo, MD Primary care provider: Jenna Luo, MD  Reason for consult:  TIA  HISTORY OF PRESENT ILLNESS: Cassie Lynn is a 48 year old left-handed white female with Buerger's disease, fibromyalgia, migraine and history of cerebral aneurysm who presents for possible TIA.  History supplemented by referring provider note.  3 weeks ago, she had an episode where she was eating spicy hot wings and suddenly developed chest pain radiating to the left arm with nausea, dyspnea and diaphoresis.  Symptoms lasted a few minutes.  The next day, she was at work when she had sudden onset numbness of the right side of her face and arm and hand.  Her right arm also was limp.  She was unable to move her lips to speak to her colleague.  Symptoms lasted seconds up to a minute.  She had a slight headache but not one of her typical migraines.  She says she still notes a numbness and tingling sensation in her right hand.  A week later, she had an EKG which was unremarkable except for poor R wave progression in the precordial leads.  Lipid panel showed chol 134, HDL 40, TG 108 and LDL 75.  Last year, she had an abnormal stress test.  Coronary artery CT scan showed no blockages.  She has a family history of blood clots.  Hypercoagulable panel from last year was negative.  MRI of brain with and without contrast and MRA of head and neck are scheduled for next week.  She has never had any episode like this in the past.  No recurrent episodes since then.    Prior to the event, she were taking ASA 81mg  daily.  Her PCP, changed the ASA to Plavix 75mg  daily and started her on Crestor 20mg  daily.  She has history of palpitations.  Cardiac event monitoring in 2019 was negative.    She quit smoking cigarettes a month ago.    PAST MEDICAL HISTORY: Past Medical History:  Diagnosis Date  . Anemia   . Aneurysm  ([place])    brain- 2015ish was gone  . 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 Fibromyalgia   . Migraine    Migraines.  Takes Depakote  . Shortness of breath dyspnea     PAST SURGICAL HISTORY: Past Surgical History:  Procedure Laterality Date  . CESAREAN SECTION    . CHOLECYSTECTOMY    . IR RADIOLOGY PERIPHERAL GUIDED IV START  10/13/2017  . IR US GUIDE VASC ACCESS RIGHT  10/13/2017  . LUMBAR LAMINECTOMY/DECOMPRESSION MICRODISCECTOMY Left 04/18/2015   Procedure: Left Lumbar five sacral-one  Microdiskectomy;  Surgeon: Kevan Ny Ditty, MD;  Location: [facility];  Service: Neurosurgery;  Laterality: Left;    MEDICATIONS: Current Outpatient Medications on File Prior to Visit  Medication Sig Dispense Refill  . amLODipine (NORVASC) 10 MG tablet TAKE 1 TABLET BY MOUTH EVERY DAY 30 tablet 0  . clopidogrel (PLAVIX) 75 MG tablet Take 1 tablet (75 mg total) by mouth daily. 90 tablet 3  . gabapentin (NEURONTIN) 300 MG capsule TAKE 1 CAPSULE BY MOUTH THREE TIMES A DAY 270 capsule 0  . losartan (COZAAR) 100 MG tablet TAKE 1/2 TABLET BY MOUTH EVERY DAY 90 tablet 1  . meloxicam (MOBIC) 7.5 MG tablet TAKE 1 TABLET BY MOUTH EVERY 12 HOURS 60 tablet 0  . methocarbamol (ROBAXIN) 750 MG tablet Take 1-2 tablets (750-1,500 mg  total) by mouth 3 (three) times daily as needed for muscle spasms. 18 tablet 0  . rosuvastatin (CRESTOR) 20 MG tablet Take 1 tablet (20 mg total) by mouth daily. 90 tablet 3  . SUMAtriptan (IMITREX) 100 MG tablet TAKE 1 TABLET BY MOUTH EVERY 2 HOURS AS NEEDED FOR MIGRAINE. 10 tablet 3  . topiramate (TOPAMAX) 50 MG tablet TAKE 1 TABLET BY MOUTH DAILY AND 2 TABLETS EVERY NIGHT 90 tablet 2   No current facility-administered medications on file prior to visit.     ALLERGIES: Allergies  Allergen Reactions  . Vicodin [Hydrocodone-Acetaminophen] Other (See Comments)    Makes her feel like she cannot breath, but no throat swelling. No issues with plain Tylenol     FAMILY HISTORY: Family History  Problem Relation Age of Onset  . Hypertension Mother   . Hypertension Father   . Hypertension Sister     SOCIAL HISTORY: Social History   Socioeconomic History  . Marital status: Single    Spouse name: Not on file  . Number of children: Not on file  . Years of education: Not on file  . Highest education level: Not on file  Occupational History  . Not on file  Social Needs  . Financial resource strain: Not on file  . Food insecurity    Worry: Not on file    Inability: Not on file  . Transportation needs    Medical: Not on file    Non-medical: Not on file  Tobacco Use  . Smoking status: Current Every Day Smoker    Years: 28.00    Types: Cigarettes  . Smokeless tobacco: Never Used  . Tobacco comment: 1 pack lasts 3 days.  Substance and Sexual Activity  . Alcohol use: Yes    Comment: occ   . Drug use: No  . Sexual activity: Not on file  Lifestyle  . Physical activity    Days per week: Not on file    Minutes per session: Not on file  . Stress: Not on file  Relationships  . Social Herbalist on phone: Not on file    Gets together: Not on file    Attends religious service: Not on file    Active member of club or organization: Not on file    Attends meetings of clubs or organizations: Not on file    Relationship status: Not on file  . Intimate partner violence    Fear of current or ex partner: Not on file    Emotionally abused: Not on file    Physically abused: Not on file    Forced sexual activity: Not on file  Other Topics Concern  . Not on file  Social History Narrative  . Not on file    REVIEW OF SYSTEMS: Constitutional: No fevers, chills, or sweats, no generalized fatigue, change in appetite Eyes: No visual changes, double vision, eye pain Ear, nose and throat: No hearing loss, ear pain, nasal congestion, sore throat Cardiovascular: No chest pain, palpitations Respiratory:  No shortness of breath at rest or  with exertion, wheezes GastrointestinaI: No nausea, vomiting, diarrhea, abdominal pain, fecal incontinence Genitourinary:  No dysuria, urinary retention or frequency Musculoskeletal:  No neck pain, back pain Integumentary: No rash, pruritus, skin lesions Neurological: as above Psychiatric: No depression, insomnia, anxiety Endocrine: No palpitations, fatigue, diaphoresis, mood swings, change in appetite, change in weight, increased thirst Hematologic/Lymphatic:  No purpura, petechiae. Allergic/Immunologic: no itchy/runny eyes, nasal congestion, recent allergic reactions, rashes  PHYSICAL  EXAM: Blood pressure 125/74, pulse 88, temperature 98.6 F (37 C), resp. rate 12, height 5' 5.5" (1.664 m), weight 183 lb (83 kg), SpO2 98 %. General: No acute distress.  Patient appears well-groomed.   Head:  Normocephalic/atraumatic Eyes:  fundi examined but not visualized Neck: supple, no paraspinal tenderness, full range of motion Back: No paraspinal tenderness Heart: regular rate and rhythm Lungs: Clear to auscultation bilaterally. Vascular: No carotid bruits. Neurological Exam: Mental status: alert and oriented to person, place, and time, recent and remote memory intact, fund of knowledge intact, attention and concentration intact, speech fluent and not dysarthric, language intact. Cranial nerves: CN I: not tested CN II: pupils equal, round and reactive to light, visual fields intact CN III, IV, VI:  full range of motion, no nystagmus, no ptosis CN V: endorses right lower facial numbness CN VII: upper and lower face symmetric CN VIII: hearing intact CN IX, X: gag intact, uvula midline CN XI: sternocleidomastoid and trapezius muscles intact CN XII: tongue midline Bulk & Tone: normal, no fasciculations. Motor:  5/5 throughou Sensation:  Pinprick sensation reduced in right upper and lower extremities, otherwise intact; vibration sensation intact. Deep Tendon Reflexes:  2+ throughout, toes  downgoing.   Finger to nose testing:  Without dysmetria.   Heel to shin:  Without dysmetria.   Gait:  Normal station and stride.  Able to turn and tandem walk. Romberg negative.  IMPRESSION: 1.  Possible CVA.  She still endorses right sided numbness.  Risk factors include Buerger's disease, migraines and history of tobacco abuse.   PLAN: 1.  Agree with Plavix 75mg  daily for secondary stroke prevention 2.  Statin therapy (LDL goal less than 70) 3.  It appears that she is scheduled for MRI of brain and MRA of head and neck for 11/27/2018.  She states she never received a call to schedule it.  I advised that she contact Dr. Samella Parr office for the contact information of the radiology facility and to contact them. 4.  Will get 2D echo with bubble study 5.  Further recommendations pending results. 6.  Follow up in 3 months.  Thank you for allowing me to take part in the care of this patient.  Metta Clines, DO  CC: Jenna Luo, MD

## 2018-11-21 ENCOUNTER — Other Ambulatory Visit: Payer: Self-pay | Admitting: *Deleted

## 2018-11-21 ENCOUNTER — Other Ambulatory Visit: Payer: Self-pay

## 2018-11-21 ENCOUNTER — Ambulatory Visit: Payer: Managed Care, Other (non HMO) | Admitting: Neurology

## 2018-11-21 ENCOUNTER — Encounter: Payer: Self-pay | Admitting: Neurology

## 2018-11-21 VITALS — BP 125/74 | HR 88 | Temp 98.6°F | Resp 12 | Ht 65.5 in | Wt 183.0 lb

## 2018-11-21 DIAGNOSIS — I731 Thromboangiitis obliterans [Buerger's disease]: Secondary | ICD-10-CM | POA: Diagnosis not present

## 2018-11-21 DIAGNOSIS — I639 Cerebral infarction, unspecified: Secondary | ICD-10-CM | POA: Diagnosis not present

## 2018-11-21 NOTE — Patient Instructions (Signed)
1.  We will check echocardiogram with bubble study 2.  Continue Plavix 75mg  daily and Crestor 3.  Contact Dr. Samella Parr office to find out where MRI was scheduled and contact the facility to verify.  In the records, it appears to be scheduled for October 5. 4.  Follow up in 3 months.

## 2018-11-22 ENCOUNTER — Encounter: Payer: Self-pay | Admitting: Family Medicine

## 2018-11-24 ENCOUNTER — Other Ambulatory Visit: Payer: Managed Care, Other (non HMO)

## 2018-11-24 ENCOUNTER — Other Ambulatory Visit: Payer: Self-pay

## 2018-11-24 DIAGNOSIS — D72828 Other elevated white blood cell count: Secondary | ICD-10-CM

## 2018-11-24 LAB — URINALYSIS, ROUTINE W REFLEX MICROSCOPIC
Bilirubin Urine: NEGATIVE
Glucose, UA: NEGATIVE
Hgb urine dipstick: NEGATIVE
Ketones, ur: NEGATIVE
Leukocytes,Ua: NEGATIVE
Nitrite: NEGATIVE
Protein, ur: NEGATIVE
Specific Gravity, Urine: 1.02 (ref 1.001–1.03)
pH: 7 (ref 5.0–8.0)

## 2018-11-27 ENCOUNTER — Ambulatory Visit (HOSPITAL_COMMUNITY): Payer: Managed Care, Other (non HMO)

## 2018-11-27 ENCOUNTER — Ambulatory Visit (HOSPITAL_COMMUNITY)
Admission: RE | Admit: 2018-11-27 | Discharge: 2018-11-27 | Disposition: A | Discharge status: Home / Self Care | Payer: Managed Care, Other (non HMO) | Source: Ambulatory Visit | Attending: Family Medicine | Admitting: Family Medicine

## 2018-11-27 ENCOUNTER — Other Ambulatory Visit: Payer: Self-pay

## 2018-11-27 DIAGNOSIS — G459 Transient cerebral ischemic attack, unspecified: Secondary | ICD-10-CM | POA: Insufficient documentation

## 2018-11-27 MED ORDER — GADOBUTROL 1 MMOL/ML IV SOLN
8.0000 mL | Freq: Once | INTRAVENOUS | Status: AC | PRN
  Administered 2018-11-27: 8 mL via INTRAVENOUS

## 2018-12-21 ENCOUNTER — Other Ambulatory Visit: Payer: Self-pay | Admitting: Family Medicine

## 2019-01-12 ENCOUNTER — Other Ambulatory Visit: Payer: Self-pay | Admitting: Family Medicine

## 2019-02-06 ENCOUNTER — Other Ambulatory Visit: Payer: Self-pay | Admitting: Family Medicine

## 2019-02-12 ENCOUNTER — Other Ambulatory Visit: Payer: Managed Care, Other (non HMO)

## 2019-02-19 ENCOUNTER — Other Ambulatory Visit: Payer: Self-pay

## 2019-02-19 ENCOUNTER — Encounter (HOSPITAL_COMMUNITY): Payer: Self-pay

## 2019-02-19 ENCOUNTER — Emergency Department (HOSPITAL_COMMUNITY)
Admission: EM | Admit: 2019-02-19 | Discharge: 2019-02-19 | Disposition: A | Discharge status: Home / Self Care | Payer: Managed Care, Other (non HMO) | Attending: Emergency Medicine | Admitting: Emergency Medicine

## 2019-02-19 DIAGNOSIS — F17210 Nicotine dependence, cigarettes, uncomplicated: Secondary | ICD-10-CM | POA: Insufficient documentation

## 2019-02-19 DIAGNOSIS — R05 Cough: Secondary | ICD-10-CM | POA: Insufficient documentation

## 2019-02-19 DIAGNOSIS — Z79899 Other long term (current) drug therapy: Secondary | ICD-10-CM | POA: Diagnosis not present

## 2019-02-19 DIAGNOSIS — R519 Headache, unspecified: Secondary | ICD-10-CM | POA: Diagnosis not present

## 2019-02-19 DIAGNOSIS — Z20828 Contact with and (suspected) exposure to other viral communicable diseases: Secondary | ICD-10-CM | POA: Diagnosis not present

## 2019-02-19 DIAGNOSIS — Z7901 Long term (current) use of anticoagulants: Secondary | ICD-10-CM | POA: Insufficient documentation

## 2019-02-19 DIAGNOSIS — R059 Cough, unspecified: Secondary | ICD-10-CM

## 2019-02-19 DIAGNOSIS — Z20822 Contact with and (suspected) exposure to covid-19: Secondary | ICD-10-CM

## 2019-02-19 MED ORDER — GUAIFENESIN 100 MG/5ML PO SYRP: 100.0000 mg | ORAL_SOLUTION | ORAL | 0 refills | Status: DC | PRN

## 2019-02-19 NOTE — ED Notes (Signed)
Pt dc'd home w/all belongings, a/o x4, ambulatory on dc

## 2019-02-19 NOTE — Discharge Instructions (Signed)
Tested for Covid virus.  You will need to be out of work until it has resulted.  I would suggest drinking plenty of fluids, Tylenol, ibuprofen at home.  Do not take ibuprofen if you think you may be pregnant.  If you have any new worsening symptoms please seek reevaluation emergency department.

## 2019-02-19 NOTE — ED Provider Notes (Signed)
[facility] EMERGENCY DEPARTMENT Provider Note   CSN: [id number] Arrival date & time: 02/19/19  1107     History Chief Complaint  Patient presents with  . Headache  . Cough    Cassie Lynn is a 48 y.o. female with past medical history significant for brain aneurysm, Buerger's disease, migraine who presents for evaluation of concern for Covid.  Patient states she has been around multiple people who tested +3 days ago.  Patient states she woke up this morning with cough productive of clear phlegm.  States she also notes when she woke up this morning she had a gradual onset headache.  States she took Tylenol and this resolved.  States this felt similar to her prior episodes of headaches.  She denies fever, chills, nausea, vomiting, vision changes, facial droop,.  Paresthesias, unilateral weakness, dizziness, lightheadedness, chest pain, shortness of breath, hemoptysis, abdominal pain, diarrhea, dysuria, speech difficulties.  Has been able to tolerate p.o. intake at home without difficulty.  Denies additional aggravating or alleviating factors. No hx of PE, DVT.  Does not want anything for symptoms at this time.  History obtained from patient and past medical records.  No interpreter is used.  HPI     Past Medical History:  Diagnosis Date  . Anemia   . Aneurysm ([place])    brain- 2015ish was gone  . 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 Fibromyalgia   . Migraine    Migraines.  Takes Depakote  . Shortness of breath dyspnea     Patient Active Problem List   Diagnosis Date Noted  . Buerger's disease ([place])   . Palpitations 07/26/2017  . Atypical chest pain 07/26/2017  . Herniated nucleus pulposis of lumbosacral region 04/18/2015  . Fibromyalgia   . Migraine     Past Surgical History:  Procedure Laterality Date  . CESAREAN SECTION    . CHOLECYSTECTOMY    . IR RADIOLOGY PERIPHERAL GUIDED IV START  10/13/2017  . IR US GUIDE VASC ACCESS  RIGHT  10/13/2017  . LUMBAR LAMINECTOMY/DECOMPRESSION MICRODISCECTOMY Left 04/18/2015   Procedure: Left Lumbar five sacral-one  Microdiskectomy;  Surgeon: Kevan Ny Ditty, MD;  Location: [facility];  Service: Neurosurgery;  Laterality: Left;     OB History    Gravida  4   Para  3   Term  3   Preterm      AB  1   Living  3     SAB  1   TAB      Ectopic      Multiple      Live Births              Family History  Problem Relation Age of Onset  . Hypertension Mother   . Hypertension Father   . Hypertension Sister     Social History   Tobacco Use  . Smoking status: Current Every Day Smoker    Years: 28.00    Types: Cigarettes  . Smokeless tobacco: Never Used  . Tobacco comment: 1 pack lasts 3 days.  Substance Use Topics  . Alcohol use: Yes    Comment: occ   . Drug use: No    Home Medications Prior to Admission medications   Medication Sig Start Date End Date Taking? Authorizing Provider  amLODipine (NORVASC) 10 MG tablet TAKE 1 TABLET BY MOUTH EVERY DAY 01/12/19   Susy Frizzle, MD  clopidogrel (PLAVIX) 75 MG tablet Take 1  tablet (75 mg total) by mouth daily. 11/10/18   Susy Frizzle, MD  gabapentin (NEURONTIN) 300 MG capsule TAKE 1 CAPSULE BY MOUTH THREE TIMES A DAY 12/21/18   Susy Frizzle, MD  guaifenesin (ROBITUSSIN) 100 MG/5ML syrup Take 5-10 mLs (100-200 mg total) by mouth every 4 (four) hours as needed for cough. 02/19/19   Daronte Shostak A, PA-C  losartan (COZAAR) 100 MG tablet TAKE 1/2 TABLET BY MOUTH EVERY DAY 02/06/19   Susy Frizzle, MD  methocarbamol (ROBAXIN) 750 MG tablet Take 1-2 tablets (750-1,500 mg total) by mouth 3 (three) times daily as needed for muscle spasms. 10/04/17   Lorin Glass, PA-C  rosuvastatin (CRESTOR) 20 MG tablet Take 1 tablet (20 mg total) by mouth daily. 11/10/18   Susy Frizzle, MD  topiramate (TOPAMAX) 50 MG tablet TAKE 1 TABLET BY MOUTH DAILY AND 2 TABLETS EVERY NIGHT 10/12/18    Susy Frizzle, MD    Allergies    Vicodin [hydrocodone-acetaminophen]  Review of Systems   Review of Systems  Constitutional: Negative.   HENT: Negative.   Respiratory: Positive for cough. Negative for apnea, choking, chest tightness, shortness of breath, wheezing and stridor.   Cardiovascular: Negative.   Gastrointestinal: Negative.   Genitourinary: Negative.   Musculoskeletal: Negative.   Skin: Negative.   Neurological: Positive for headaches. Negative for dizziness, tremors, seizures, syncope, facial asymmetry, speech difficulty, weakness, light-headedness and numbness.  All other systems reviewed and are negative.   Physical Exam Updated Vital Signs BP 129/71   Pulse 85   Temp 98.2 F (36.8 C) (Oral)   Resp 16   Ht 5\' 5"  (1.651 m)   Wt 79.4 kg   LMP 02/05/2019   SpO2 100%   BMI 29.12 kg/m   Physical Exam Vitals and nursing note reviewed.  Constitutional:      General: She is not in acute distress.    Appearance: She is not ill-appearing, toxic-appearing or diaphoretic.  HENT:     Head: Normocephalic and atraumatic.     Jaw: There is normal jaw occlusion.     Right Ear: Tympanic membrane, ear canal and external ear normal. There is no impacted cerumen. No hemotympanum. Tympanic membrane is not injected, scarred, perforated, erythematous, retracted or bulging.     Left Ear: Tympanic membrane, ear canal and external ear normal. There is no impacted cerumen. No hemotympanum. Tympanic membrane is not injected, scarred, perforated, erythematous, retracted or bulging.     Ears:     Comments: No Mastoid tenderness.    Nose:     Comments: Clear rhinorrhea and congestion to bilateral nares.  No sinus tenderness.    Mouth/Throat:     Comments: Posterior oropharynx clear.  Mucous membranes moist.  Tonsils without erythema or exudate.  Uvula midline without deviation.  No evidence of PTA or RPA.  No drooling, dysphasia or trismus.  Phonation normal. Eyes:     Comments:  EOM intac. No nystagmus  Neck:     Trachea: Trachea and phonation normal.     Meningeal: Brudzinski's sign and Kernig's sign absent.     Comments: No Neck stiffness or neck rigidity.  No meningismus.  No cervical lymphadenopathy. Cardiovascular:     Comments: No murmurs rubs or gallops. Pulmonary:     Comments: Clear to auscultation bilaterally without wheeze, rhonchi or rales.  No accessory muscle usage.  Able speak in full sentences. Abdominal:     Comments: Soft, nontender without rebound or guarding.  No CVA  tenderness.  Musculoskeletal:     Comments: Moves all 4 extremities without difficulty.  Lower extremities without edema, erythema or warmth.  Skin:    Comments: Brisk capillary refill.  No rashes or lesions.  Neurological:     General: No focal deficit present.     Mental Status: She is alert.     Cranial Nerves: Cranial nerves are intact.     Sensory: Sensation is intact.     Motor: Motor function is intact.     Coordination: Coordination is intact.     Gait: Gait is intact.     Comments: Mental Status:  Alert, oriented, thought content appropriate. Speech fluent without evidence of aphasia. Able to follow 2 step commands without difficulty.  Cranial Nerves:  II:  Peripheral visual fields grossly normal, pupils equal, round, reactive to light III,IV, VI: ptosis not present, extra-ocular motions intact bilaterally  V,VII: smile symmetric, facial light touch sensation equal VIII: hearing grossly normal bilaterally  IX,X: midline uvula rise  XI: bilateral shoulder shrug equal and strong XII: midline tongue extension  Motor:  5/5 in upper and lower extremities bilaterally including strong and equal grip strength and dorsiflexion/plantar flexion Sensory: Pinprick and light touch normal in all extremities.  Deep Tendon Reflexes: 2+ and symmetric  Cerebellar: normal finger-to-nose with bilateral upper extremities Gait: normal gait and balance CV: distal pulses palpable  throughout       ED Results / Procedures / Treatments   Labs (all labs ordered are listed, but only abnormal results are displayed) Labs Reviewed  NOVEL CORONAVIRUS, NAA (HOSP ORDER, SEND-OUT TO REF LAB; TAT 18-24 HRS)    EKG None  Radiology No results found.  Procedures Procedures (including critical care time)  Medications Ordered in ED Medications - No data to display  ED Course  I have reviewed the triage vital signs and the nursing notes.  Pertinent labs & imaging results that were available during my care of the patient were reviewed by me and considered in my medical decision making (see chart for details).   48 year old appears otherwise well presents for evaluation of concern for Covid infection.  She is afebrile, nonseptic, non-ill-appearing.  Patient with cough productive of clear sputum.  Heart and lungs clear.  Patient also with gradual onset headache which patient states is similar to her prior episodes of headache.  Headache resolved with Tylenol PTA.  She is a nonfocal neurologic exam without deficits.  Presentation non concerning for [facility], ICH, CVA, dissection, aneurysm, Meningitis, or temporal arteritis. Pt is afebrile with no focal neuro deficits, nuchal rigidity, or change in vision.  With reassuring vital signs.  No evidence of DVT on exam.  Patient likely with viral illness.  Low suspicion for bacterial etiology of symptoms.  We will outpatient test for Covid given exposure.  Discussed symptomatic management.  Patient voiced understanding and is agreeable for follow-up.  The patient has been appropriately medically screened and/or stabilized in the ED. I have low suspicion for any other emergent medical condition which would require further screening, evaluation or treatment in the ED or require inpatient management.  Patient is hemodynamically stable and in no acute distress.  Patient able to ambulate in department prior to ED.  Evaluation does not show acute  pathology that would require ongoing or additional emergent interventions while in the emergency department or further inpatient treatment.  I have discussed the diagnosis with the patient and answered all questions.  Pain is been managed while in the emergency department and  patient has no further complaints prior to discharge.  Patient is comfortable with plan discussed in room and is stable for discharge at this time.  I have discussed strict return precautions for returning to the emergency department.  Patient was encouraged to follow-up with PCP/specialist refer to at discharge.    MDM Rules/Calculators/A&P                      Cassie Lynn was evaluated in Emergency Department on 02/19/2019 for the symptoms described in the history of present illness. She was evaluated in the context of the global COVID-19 pandemic, which necessitated consideration that the patient might be at risk for infection with the SARS-CoV-2 virus that causes COVID-19. Institutional protocols and algorithms that pertain to the evaluation of patients at risk for COVID-19 are in a state of rapid change based on information released by regulatory bodies including the CDC and federal and state organizations. These policies and algorithms were followed during the patient's care in the ED. Final Clinical Impression(s) / ED Diagnoses Final diagnoses:  Acute nonintractable headache, unspecified headache type  Cough  Close exposure to COVID-19 virus    Rx / DC Orders ED Discharge Orders         Ordered    guaifenesin (ROBITUSSIN) 100 MG/5ML syrup  Every 4 hours PRN     02/19/19 1301           Tzvi Economou A, PA-C 02/19/19 1301    Tegeler, Gwenyth Allegra, MD 02/19/19 [phone]

## 2019-02-19 NOTE — ED Triage Notes (Signed)
Pt reports she woke up with a headache and cough. Reports she found out that some people she has been around tested positive for COVID. Pt a.o resp e.u, denies SOB/CP

## 2019-02-20 LAB — NOVEL CORONAVIRUS, NAA (HOSP ORDER, SEND-OUT TO REF LAB; TAT 18-24 HRS): SARS-CoV-2, NAA: NOT DETECTED

## 2019-03-12 NOTE — Progress Notes (Deleted)
  Patient cancelled appointment due to work

## 2019-03-13 ENCOUNTER — Other Ambulatory Visit: Payer: Self-pay

## 2019-03-13 ENCOUNTER — Telehealth (INDEPENDENT_AMBULATORY_CARE_PROVIDER_SITE_OTHER): Payer: Managed Care, Other (non HMO) | Admitting: Neurology

## 2019-03-13 NOTE — Progress Notes (Deleted)
Patient cancelled appointment due to work

## 2019-03-14 NOTE — Progress Notes (Signed)
Patient cancelled appointment due to work

## 2019-04-05 ENCOUNTER — Other Ambulatory Visit: Payer: Self-pay | Admitting: Family Medicine

## 2019-11-11 ENCOUNTER — Other Ambulatory Visit: Payer: Self-pay | Admitting: Family Medicine

## 2019-11-19 ENCOUNTER — Other Ambulatory Visit: Payer: Self-pay | Admitting: Family Medicine

## 2020-02-28 ENCOUNTER — Ambulatory Visit (INDEPENDENT_AMBULATORY_CARE_PROVIDER_SITE_OTHER): Payer: Managed Care, Other (non HMO) | Admitting: Family Medicine

## 2020-02-28 ENCOUNTER — Other Ambulatory Visit: Payer: Self-pay

## 2020-02-28 VITALS — BP 138/70 | HR 82 | Temp 98.1°F | Resp 16

## 2020-02-28 DIAGNOSIS — I731 Thromboangiitis obliterans [Buerger's disease]: Secondary | ICD-10-CM | POA: Diagnosis not present

## 2020-02-28 DIAGNOSIS — M5431 Sciatica, right side: Secondary | ICD-10-CM

## 2020-02-28 DIAGNOSIS — Z8673 Personal history of transient ischemic attack (TIA), and cerebral infarction without residual deficits: Secondary | ICD-10-CM

## 2020-02-28 DIAGNOSIS — E7800 Pure hypercholesterolemia, unspecified: Secondary | ICD-10-CM | POA: Diagnosis not present

## 2020-02-28 MED ORDER — PREDNISONE 20 MG PO TABS: ORAL_TABLET | ORAL | 0 refills | Status: DC

## 2020-02-28 MED ORDER — HYDROCODONE-ACETAMINOPHEN 5-325 MG PO TABS: 1.0000 | ORAL_TABLET | Freq: Four times a day (QID) | ORAL | 0 refills | Status: DC | PRN

## 2020-02-28 NOTE — Progress Notes (Signed)
Subjective:    Patient ID: Cassie Lynn, female    DOB: 07/23/1970, 50 y.o.   MRN: [id number]  HPI Since I last saw the patient, her boyfriend died from alcohol induced cirrhosis and hepatic failure.  She is obviously suffering with the grieving process of losing her significant other.  Around the same time she developed severe pain in the right side of her lower back.  It is radiating down her right leg.  She reports constant pain in her lower back roughly at the level of L5.  She has a history of 2 herniated disc in her lumbar spine on an MRI in 2016 that were causing left-sided nerve impingement.  However she has never had problems causing right-sided nerve impingement.  The pain seems to be getting worse.  Is been present over the last 10 days.  She denies any numbness or tingling in her saddle area.  She denies any bowel or bladder incontinence.  She denies any leg weakness.  Blood pressure today is adequately controlled at 138/70.  Unfortunately she started smoking again after the death of her boyfriend.  She is overdue to recheck her fasting lab work.  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  She also has a history of a TIA for which she takes Plavix and Lipitor.  She denies any chest pain, strokelike symptoms, angina. Past Medical History:  Diagnosis Date  . Anemia   . Aneurysm ([facility])    brain- 2015ish was gone  . Buerger's disease ([facility])    pvd with Raynaud's phenomenon  . Fibromyalgia   . Migraine    Migraines.  Takes Depakote  . Shortness of breath dyspnea    Past Surgical History:  Procedure Laterality Date  . CESAREAN SECTION    . CHOLECYSTECTOMY    . IR RADIOLOGY PERIPHERAL GUIDED IV START  10/13/2017  . IR US GUIDE VASC ACCESS RIGHT  10/13/2017  . LUMBAR LAMINECTOMY/DECOMPRESSION MICRODISCECTOMY Left 04/18/2015   Procedure: Left Lumbar five sacral-one  Microdiskectomy;  Surgeon: Loura Halt  Ditty, MD;  Location: [facility];  Service: Neurosurgery;  Laterality: Left;   Current Outpatient Medications on File Prior to Visit  Medication Sig Dispense Refill  . amLODipine (NORVASC) 10 MG tablet TAKE 1 TABLET BY MOUTH EVERY DAY 90 tablet 2  . clopidogrel (PLAVIX) 75 MG tablet TAKE 1 TABLET BY MOUTH EVERY DAY 30 tablet 0  . gabapentin (NEURONTIN) 300 MG capsule TAKE 1 CAPSULE BY MOUTH THREE TIMES A DAY 270 capsule 0  . guaifenesin (ROBITUSSIN) 100 MG/5ML syrup Take 5-10 mLs (100-200 mg total) by mouth every 4 (four) hours as needed for cough. 60 mL 0  . losartan (COZAAR) 100 MG tablet TAKE 1/2 TABLET BY MOUTH EVERY DAY 90 tablet 1  . methocarbamol (ROBAXIN) 750 MG tablet Take 1-2 tablets (750-1,500 mg total) by mouth 3 (three) times daily as needed for muscle spasms. 18 tablet 0  . rosuvastatin (CRESTOR) 20 MG tablet TAKE 1 TABLET BY MOUTH EVERY DAY 30 tablet 0  . topiramate (TOPAMAX) 50 MG tablet TAKE 1 TABLET BY MOUTH DAILY AND 2 TABLETS EVERY NIGHT 90 tablet 2   No current facility-administered medications on file prior to visit.   Allergies  Allergen Reactions  . Vicodin [Hydrocodone-Acetaminophen] Other (See Comments)    Makes her feel like she cannot breath, but no throat swelling. No issues with plain Tylenol   Social History   Socioeconomic  History  . Marital status: Single    Spouse name: Not on file  . Number of children: Not on file  . Years of education: Not on file  . Highest education level: Not on file  Occupational History  . Not on file  Tobacco Use  . Smoking status: Current Every Day Smoker    Years: 28.00    Types: Cigarettes  . Smokeless tobacco: Never Used  . Tobacco comment: 1 pack lasts 3 days.  Substance and Sexual Activity  . Alcohol use: Yes    Comment: occ   . Drug use: No  . Sexual activity: Not on file  Other Topics Concern  . Not on file  Social History Narrative   Leave with boyfriend   1-story home 2 steps at the front   1-cup a  day   Social Determinants of Health   Financial Resource Strain: Not on file  Food Insecurity: Not on file  Transportation Needs: Not on file  Physical Activity: Not on file  Stress: Not on file  Social Connections: Not on file  Intimate Partner Violence: Not on file      Review of Systems  All other systems reviewed and are negative.      Objective:   Physical Exam Vitals reviewed.  Constitutional:      General: She is not in acute distress.    Appearance: Normal appearance. She is obese. She is not ill-appearing, toxic-appearing or diaphoretic.  Cardiovascular:     Rate and Rhythm: Normal rate and regular rhythm.     Heart sounds: Normal heart sounds. No murmur heard. No friction rub. No gallop.   Pulmonary:     Effort: Pulmonary effort is normal. No respiratory distress.     Breath sounds: Normal breath sounds. No stridor. No wheezing, rhonchi or rales.  Chest:     Chest wall: No tenderness.  Abdominal:     General: Bowel sounds are normal. There is no distension.     Palpations: Abdomen is soft.     Tenderness: There is no abdominal tenderness. There is no guarding or rebound.  Musculoskeletal:     Right lower leg: No edema.     Left lower leg: No edema.       Legs:  Neurological:     General: No focal deficit present.     Mental Status: She is alert and oriented to person, place, and time. Mental status is at baseline.     Cranial Nerves: No cranial nerve deficit.     Motor: No weakness.     Coordination: Coordination normal.     Gait: Gait normal.           Assessment & Plan:  Right sided sciatica - Plan: DG Lumbar Spine Complete  Buerger's disease ([place]) - Plan: CBC with Differential/Platelet, COMPLETE METABOLIC PANEL WITH GFR, Lipid panel  History of TIA (transient ischemic attack)  Pure hypercholesterolemia  Begin by obtaining an x-ray of her lumbar spine to evaluate for potential causes of her right-sided sciatica.  Meanwhile start a  prednisone taper pack.  She can use Norco 5/325 1 p.o. every 6 hours as needed pain for breakthrough pain.  Reassess in 1 week.  Due to her history of Buerger's disease and TIA, I recommended smoking cessation.  I will check a CBC, CMP, lipid panel.  Continue Plavix for secondary prevention of TIA and stroke.  Blood pressures adequately controlled.

## 2020-02-29 LAB — CBC WITH DIFFERENTIAL/PLATELET
Absolute Monocytes: 832 cells/uL (ref 200–950)
Basophils Absolute: 151 cells/uL (ref 0–200)
Basophils Relative: 1.2 %
Eosinophils Absolute: 315 cells/uL (ref 15–500)
Eosinophils Relative: 2.5 %
HCT: 32.4 % — ABNORMAL LOW (ref 35.0–45.0)
Hemoglobin: 9.9 g/dL — ABNORMAL LOW (ref 11.7–15.5)
Lymphs Abs: 5116 cells/uL — ABNORMAL HIGH (ref 850–3900)
MCH: 23 pg — ABNORMAL LOW (ref 27.0–33.0)
MCHC: 30.6 g/dL — ABNORMAL LOW (ref 32.0–36.0)
MCV: 75.2 fL — ABNORMAL LOW (ref 80.0–100.0)
MPV: 11 fL (ref 7.5–12.5)
Monocytes Relative: 6.6 %
Neutro Abs: 6187 cells/uL (ref 1500–7800)
Neutrophils Relative %: 49.1 %
Platelets: 431 10*3/uL — ABNORMAL HIGH (ref 140–400)
RBC: 4.31 10*6/uL (ref 3.80–5.10)
RDW: 14.9 % (ref 11.0–15.0)
Total Lymphocyte: 40.6 %
WBC: 12.6 10*3/uL — ABNORMAL HIGH (ref 3.8–10.8)

## 2020-02-29 LAB — COMPLETE METABOLIC PANEL WITH GFR
AG Ratio: 1.6 (calc) (ref 1.0–2.5)
ALT: 11 U/L (ref 6–29)
AST: 14 U/L (ref 10–35)
Albumin: 4.2 g/dL (ref 3.6–5.1)
Alkaline phosphatase (APISO): 40 U/L (ref 31–125)
BUN: 10 mg/dL (ref 7–25)
CO2: 27 mmol/L (ref 20–32)
Calcium: 9.4 mg/dL (ref 8.6–10.2)
Chloride: 104 mmol/L (ref 98–110)
Creat: 0.74 mg/dL (ref 0.50–1.10)
GFR, Est African American: 110 mL/min/{1.73_m2} (ref >=60)
GFR, Est Non African American: 95 mL/min/{1.73_m2} (ref >=60)
Globulin: 2.7 g/dL (calc) (ref 1.9–3.7)
Glucose, Bld: 73 mg/dL (ref 65–99)
Potassium: 4.4 mmol/L (ref 3.5–5.3)
Sodium: 138 mmol/L (ref 135–146)
Total Bilirubin: 0.4 mg/dL (ref 0.2–1.2)
Total Protein: 6.9 g/dL (ref 6.1–8.1)

## 2020-02-29 LAB — LIPID PANEL
Cholesterol: 137 mg/dL (ref <200)
HDL: 41 mg/dL — ABNORMAL LOW (ref >=50)
LDL Cholesterol (Calc): 75 mg/dL (calc)
Non-HDL Cholesterol (Calc): 96 mg/dL (calc) (ref <130)
Total CHOL/HDL Ratio: 3.3 (calc) (ref <5.0)
Triglycerides: 126 mg/dL (ref <150)

## 2020-03-04 ENCOUNTER — Ambulatory Visit
Admission: RE | Admit: 2020-03-04 | Discharge: 2020-03-04 | Disposition: A | Discharge status: Home / Self Care | Payer: Managed Care, Other (non HMO) | Source: Ambulatory Visit | Attending: Family Medicine | Admitting: Family Medicine

## 2020-03-04 ENCOUNTER — Other Ambulatory Visit: Payer: Self-pay | Admitting: *Deleted

## 2020-03-04 DIAGNOSIS — M5431 Sciatica, right side: Secondary | ICD-10-CM

## 2020-03-04 DIAGNOSIS — D649 Anemia, unspecified: Secondary | ICD-10-CM

## 2020-03-04 MED ORDER — FERROUS SULFATE 325 (65 FE) MG PO TABS: 325.0000 mg | ORAL_TABLET | Freq: Every day | ORAL | 3 refills | Status: DC

## 2020-03-06 ENCOUNTER — Other Ambulatory Visit: Payer: Self-pay | Admitting: *Deleted

## 2020-03-06 DIAGNOSIS — M5431 Sciatica, right side: Secondary | ICD-10-CM

## 2020-03-07 ENCOUNTER — Encounter: Payer: Self-pay | Admitting: *Deleted

## 2020-04-01 ENCOUNTER — Other Ambulatory Visit: Payer: Self-pay

## 2020-04-01 ENCOUNTER — Ambulatory Visit (INDEPENDENT_AMBULATORY_CARE_PROVIDER_SITE_OTHER): Payer: Managed Care, Other (non HMO) | Admitting: Family Medicine

## 2020-04-01 VITALS — BP 142/88 | HR 73 | Temp 97.8°F | Resp 16 | Ht 65.0 in | Wt 183.0 lb

## 2020-04-01 DIAGNOSIS — D649 Anemia, unspecified: Secondary | ICD-10-CM | POA: Diagnosis not present

## 2020-04-01 DIAGNOSIS — M5441 Lumbago with sciatica, right side: Secondary | ICD-10-CM

## 2020-04-01 MED ORDER — PREDNISONE 20 MG PO TABS: ORAL_TABLET | ORAL | 0 refills | Status: DC

## 2020-04-01 NOTE — Progress Notes (Signed)
Subjective:    Patient ID: Cassie Lynn, female    DOB: 03/31/1970, 50 y.o.   MRN: [id number]  HPI  02/28/20 Since I last saw the patient, her boyfriend died from alcohol induced cirrhosis and hepatic failure.  She is obviously suffering with the grieving process of losing her significant other.  Around the same time she developed severe pain in the right side of her lower back.  It is radiating down her right leg.  She reports constant pain in her lower back roughly at the level of L5.  She has a history of 2 herniated disc in her lumbar spine on an MRI in 2016 that were causing left-sided nerve impingement.  However she has never had problems causing right-sided nerve impingement.  The pain seems to be getting worse.  Is been present over the last 10 days.  She denies any numbness or tingling in her saddle area.  She denies any bowel or bladder incontinence.  She denies any leg weakness.  Blood pressure today is adequately controlled at 138/70.  Unfortunately she started smoking again after the death of her boyfriend.  She is overdue to recheck her fasting lab work.  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  She also has a history of a TIA for which she takes Plavix and Lipitor.  She denies any chest pain, strokelike symptoms, angina.  At that time, my plan was: Begin by obtaining an x-ray of her lumbar spine to evaluate for potential causes of her right-sided sciatica.  Meanwhile start a prednisone taper pack.  She can use Norco 5/325 1 p.o. every 6 hours as needed pain for breakthrough pain.  Reassess in 1 week.  Due to her history of Buerger's disease and TIA, I recommended smoking cessation.  I will check a CBC, CMP, lipid panel.  Continue Plavix for secondary prevention of TIA and stroke.  Blood pressures adequately controlled.  04/01/20 Xray showed mild DDD at L4-5 and mild to moderate DDD at L5-S1.  Labs showed  microscopic anemia.  Hemoglobin was less than 10.  She is currently taking iron supplement.  She reports heavy periods every month.  She states that she can go through 7 pads a day when she has her period.  Therefore I believe that her microscopic anemia is due to a combination of iron deficiency and menorrhagia.  However the patient is also concerned regarding her low back pain.  She continues to report pain and tenderness in her back.  She also reports improvement in the neuropathic pain going down her right leg.  However she states that she is unable to resume her previous job.  They have her lifting heavy objects at work and I have put her on a 30 pound weight restriction due to the sciatica.  The sciatica is improving but she still states that she is experiencing tenderness on the right side and down the right leg.  She previously saw Dr. Vertell Limber with neurosurgery.  The orthopedist refused the referral but instead encouraged her to go back to her original back doctor.  Therefore she is asking for referral back to see Dr. Vertell Limber. Past Medical History:  Diagnosis Date  . Anemia   . 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 Buerger's disease ([place])    pvd with Raynaud's phenomenon  . Fibromyalgia   . Migraine    Migraines.  Takes Depakote  .  Shortness of breath dyspnea    Past Surgical History:  Procedure Laterality Date  . CESAREAN SECTION    . CHOLECYSTECTOMY    . IR RADIOLOGY PERIPHERAL GUIDED IV START  10/13/2017  . IR US GUIDE VASC ACCESS RIGHT  10/13/2017  . LUMBAR LAMINECTOMY/DECOMPRESSION MICRODISCECTOMY Left 04/18/2015   Procedure: Left Lumbar five sacral-one  Microdiskectomy;  Surgeon: Kevan Ny Ditty, MD;  Location: [facility];  Service: Neurosurgery;  Laterality: Left;   Current Outpatient Medications on File Prior to Visit  Medication Sig Dispense Refill  . amLODipine (NORVASC) 10 MG tablet TAKE 1 TABLET BY MOUTH EVERY DAY 90 tablet 2  . clopidogrel (PLAVIX) 75 MG tablet  TAKE 1 TABLET BY MOUTH EVERY DAY 30 tablet 0  . ferrous sulfate (FERROUSUL) 325 (65 FE) MG tablet Take 1 tablet (325 mg total) by mouth daily with breakfast. 30 tablet 3  . gabapentin (NEURONTIN) 300 MG capsule TAKE 1 CAPSULE BY MOUTH THREE TIMES A DAY 270 capsule 0  . guaifenesin (ROBITUSSIN) 100 MG/5ML syrup Take 5-10 mLs (100-200 mg total) by mouth every 4 (four) hours as needed for cough. 60 mL 0  . HYDROcodone-acetaminophen (NORCO) 5-325 MG tablet Take 1 tablet by mouth every 6 (six) hours as needed for moderate pain. 30 tablet 0  . losartan (COZAAR) 100 MG tablet TAKE 1/2 TABLET BY MOUTH EVERY DAY 90 tablet 1  . methocarbamol (ROBAXIN) 750 MG tablet Take 1-2 tablets (750-1,500 mg total) by mouth 3 (three) times daily as needed for muscle spasms. 18 tablet 0  . predniSONE (DELTASONE) 20 MG tablet 3 tabs poqday 1-2, 2 tabs poqday 3-4, 1 tab poqday 5-6 12 tablet 0  . rosuvastatin (CRESTOR) 20 MG tablet TAKE 1 TABLET BY MOUTH EVERY DAY 30 tablet 0  . topiramate (TOPAMAX) 50 MG tablet TAKE 1 TABLET BY MOUTH DAILY AND 2 TABLETS EVERY NIGHT 90 tablet 2   No current facility-administered medications on file prior to visit.   Allergies  Allergen Reactions  . Vicodin [Hydrocodone-Acetaminophen] Other (See Comments)    Makes her feel like she cannot breath, but no throat swelling. No issues with plain Tylenol   Social History   Socioeconomic History  . Marital status: Single    Spouse name: Not on file  . Number of children: Not on file  . Years of education: Not on file  . Highest education level: Not on file  Occupational History  . Not on file  Tobacco Use  . Smoking status: Current Every Day Smoker    Years: 28.00    Types: Cigarettes  . Smokeless tobacco: Never Used  . Tobacco comment: 1 pack lasts 3 days.  Substance and Sexual Activity  . Alcohol use: Yes    Comment: occ   . Drug use: No  . Sexual activity: Not on file  Other Topics Concern  . Not on file  Social History  Narrative   Leave with boyfriend   1-story home 2 steps at the front   1-cup a day   Social Determinants of Health   Financial Resource Strain: Not on file  Food Insecurity: Not on file  Transportation Needs: Not on file  Physical Activity: Not on file  Stress: Not on file  Social Connections: Not on file  Intimate Partner Violence: Not on file      Review of Systems  All other systems reviewed and are negative.      Objective:   Physical Exam Vitals reviewed.  Constitutional:  General: She is not in acute distress.    Appearance: Normal appearance. She is obese. She is not ill-appearing, toxic-appearing or diaphoretic.  Cardiovascular:     Rate and Rhythm: Normal rate and regular rhythm.     Heart sounds: Normal heart sounds. No murmur heard. No friction rub. No gallop.   Pulmonary:     Effort: Pulmonary effort is normal. No respiratory distress.     Breath sounds: Normal breath sounds. No stridor. No wheezing, rhonchi or rales.  Chest:     Chest wall: No tenderness.  Abdominal:     General: Bowel sounds are normal. There is no distension.     Palpations: Abdomen is soft.     Tenderness: There is no abdominal tenderness. There is no guarding or rebound.  Musculoskeletal:     Right lower leg: No edema.     Left lower leg: No edema.       Legs:  Neurological:     General: No focal deficit present.     Mental Status: She is alert and oriented to person, place, and time. Mental status is at baseline.     Cranial Nerves: No cranial nerve deficit.     Motor: No weakness.     Coordination: Coordination normal.     Gait: Gait normal.           Assessment & Plan:  Anemia, unspecified type - Plan: CBC with Differential/Platelet, Anemia panel  Acute right-sided low back pain with right-sided sciatica - Plan: Ambulatory referral to Neurosurgery  She continues to have low back pain with right-sided sciatica.  It is improving however is still present therefore  I have recommended she continue the 30 pound weight lifting restriction.  I will try 1 additional prednisone taper pack as it has seemed to help.  I will put in a referral back to see her original neurosurgeon given the fact she feels like she is unable to do her current job due to the lifting requirements.  Therefore we will get their opinion as this likely will become an issue with her job and employment.  I will also consult physical therapy.  Meanwhile, recheck a CBC.  If anemia is not improving on iron supplement I will check her stool for blood and also consult GYN because I believe her heavy periods are likely the source of her severe anemia.

## 2020-04-02 LAB — FOLATE: Folate: 8.1 ng/mL

## 2020-04-02 LAB — CBC WITH DIFFERENTIAL/PLATELET
Absolute Monocytes: 870 cells/uL (ref 200–950)
Basophils Absolute: 145 cells/uL (ref 0–200)
Basophils Relative: 1 %
Eosinophils Absolute: 377 cells/uL (ref 15–500)
Eosinophils Relative: 2.6 %
HCT: 34.3 % — ABNORMAL LOW (ref 35.0–45.0)
Hemoglobin: 10.4 g/dL — ABNORMAL LOW (ref 11.7–15.5)
Lymphs Abs: 4350 cells/uL — ABNORMAL HIGH (ref 850–3900)
MCH: 22.8 pg — ABNORMAL LOW (ref 27.0–33.0)
MCHC: 30.3 g/dL — ABNORMAL LOW (ref 32.0–36.0)
MCV: 75.2 fL — ABNORMAL LOW (ref 80.0–100.0)
MPV: 10.9 fL (ref 7.5–12.5)
Monocytes Relative: 6 %
Neutro Abs: 8758 cells/uL — ABNORMAL HIGH (ref 1500–7800)
Neutrophils Relative %: 60.4 %
Platelets: 433 10*3/uL — ABNORMAL HIGH (ref 140–400)
RBC: 4.56 10*6/uL (ref 3.80–5.10)
RDW: 15.7 % — ABNORMAL HIGH (ref 11.0–15.0)
Total Lymphocyte: 30 %
WBC: 14.5 10*3/uL — ABNORMAL HIGH (ref 3.8–10.8)

## 2020-04-02 LAB — IRON,TIBC AND FERRITIN PANEL
%SAT: 3 % (calc) — ABNORMAL LOW (ref 16–45)
Ferritin: 4 ng/mL — ABNORMAL LOW (ref 16–232)
Iron: 13 ug/dL — ABNORMAL LOW (ref 40–190)
TIBC: 488 mcg/dL (calc) — ABNORMAL HIGH (ref 250–450)

## 2020-04-02 LAB — VITAMIN B12: Vitamin B-12: 295 pg/mL (ref 200–1100)

## 2020-04-02 LAB — RETICULOCYTES
ABS Retic: 78030 cells/uL (ref 20000–8000)
Retic Ct Pct: 1.7 %

## 2020-04-08 ENCOUNTER — Other Ambulatory Visit: Payer: Self-pay | Admitting: Family Medicine

## 2020-04-17 LAB — FECAL GLOBIN BY IMMUNOCHEMISTRY

## 2020-04-17 LAB — HOUSE ACCOUNT TRACKING

## 2020-04-24 ENCOUNTER — Ambulatory Visit: Payer: Managed Care, Other (non HMO)

## 2020-05-13 DIAGNOSIS — M47816 Spondylosis without myelopathy or radiculopathy, lumbar region: Secondary | ICD-10-CM | POA: Insufficient documentation

## 2020-05-13 DIAGNOSIS — M5136 Other intervertebral disc degeneration, lumbar region: Secondary | ICD-10-CM | POA: Insufficient documentation

## 2020-05-13 DIAGNOSIS — G8929 Other chronic pain: Secondary | ICD-10-CM | POA: Insufficient documentation

## 2020-06-09 ENCOUNTER — Other Ambulatory Visit: Payer: Self-pay | Admitting: Family Medicine

## 2020-06-11 ENCOUNTER — Telehealth: Payer: Self-pay | Admitting: Family Medicine

## 2020-06-11 NOTE — Telephone Encounter (Signed)
Received fax from [place] requesting refill on clopidogrel (PLAVIX) 75 MG tablet

## 2020-06-12 MED ORDER — CLOPIDOGREL BISULFATE 75 MG PO TABS: 1.0000 | ORAL_TABLET | Freq: Every day | ORAL | 3 refills | Status: DC

## 2020-07-16 ENCOUNTER — Other Ambulatory Visit: Payer: Self-pay | Admitting: *Deleted

## 2020-07-16 MED ORDER — ROSUVASTATIN CALCIUM 20 MG PO TABS: 20.0000 mg | ORAL_TABLET | Freq: Every day | ORAL | 1 refills | Status: DC

## 2020-09-14 ENCOUNTER — Other Ambulatory Visit: Payer: Self-pay | Admitting: Family Medicine

## 2021-01-02 ENCOUNTER — Other Ambulatory Visit: Payer: Self-pay | Admitting: Family Medicine

## 2021-01-02 ENCOUNTER — Other Ambulatory Visit: Payer: Self-pay

## 2021-03-19 ENCOUNTER — Encounter: Payer: Self-pay | Admitting: Family Medicine

## 2021-03-19 ENCOUNTER — Ambulatory Visit: Payer: Managed Care, Other (non HMO) | Admitting: Family Medicine

## 2021-03-19 ENCOUNTER — Other Ambulatory Visit: Payer: Self-pay

## 2021-03-19 VITALS — BP 148/72 | HR 83 | Temp 98.2°F | Resp 18 | Ht 65.0 in | Wt 186.0 lb

## 2021-03-19 DIAGNOSIS — F4323 Adjustment disorder with mixed anxiety and depressed mood: Secondary | ICD-10-CM | POA: Diagnosis not present

## 2021-03-19 MED ORDER — ALPRAZOLAM 0.5 MG PO TABS: 0.5000 mg | ORAL_TABLET | Freq: Three times a day (TID) | ORAL | 0 refills | Status: DC | PRN

## 2021-03-19 MED ORDER — ESCITALOPRAM OXALATE 10 MG PO TABS: 10.0000 mg | ORAL_TABLET | Freq: Every day | ORAL | 3 refills | Status: DC

## 2021-03-19 NOTE — Progress Notes (Signed)
Subjective:    Patient ID: Cassie Lynn, female    DOB: [birth year], 51 y.o.   MRN: [id number]  HPI  I have not seen the patient in a year.  Patient has lost her boyfriend alcoholic cirrhosis.  Her mother is battling mesothelioma.  She feels overwhelmed.  She feels depressed.  She is having panic attacks.  The panic attacks are characterized by pain and pressure in the right arm that radiates into the right side of the neck and across her chest.  She denies any shortness of breath.  She denies any palpitations but she does report feeling overwhelmed.  She cannot sleep at night.  She reports racing thoughts.  She reports anhedonia and sadness.  She states that "I need something for my nerves". 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  Takes Depakote   Shortness of breath dyspnea    Past Surgical History:  Procedure Laterality Date   CESAREAN SECTION     CHOLECYSTECTOMY     IR RADIOLOGY PERIPHERAL GUIDED IV START  10/13/2017   IR US GUIDE VASC ACCESS RIGHT  10/13/2017   LUMBAR LAMINECTOMY/DECOMPRESSION MICRODISCECTOMY Left 04/18/2015   Procedure: Left Lumbar five sacral-one  Microdiskectomy;  Surgeon: Kevan Ny Ditty, MD;  Location: [facility];  Service: Neurosurgery;  Laterality: Left;   Current Outpatient Medications on File Prior to Visit  Medication Sig Dispense Refill   amLODipine (NORVASC) 10 MG tablet Take by mouth.     clopidogrel (PLAVIX) 75 MG tablet TAKE 1 TABLET BY MOUTH EVERY DAY 90 tablet 3   cyclobenzaprine (FLEXERIL) 5 MG tablet Take by mouth.     gabapentin (NEURONTIN) 300 MG capsule TAKE 1 CAPSULE BY MOUTH THREE TIMES A DAY 270 capsule 0   losartan (COZAAR) 100 MG tablet TAKE 1/2 TABLET BY MOUTH EVERY DAY 90 tablet 1   metoprolol tartrate (LOPRESSOR) 50 MG tablet Take 25 mg by mouth daily.     rosuvastatin  (CRESTOR) 20 MG tablet Take 1 tablet (20 mg total) by mouth daily. 90 tablet 1   SUMAtriptan (IMITREX) 100 MG tablet sumatriptan 100 mg tablet     topiramate (TOPAMAX) 50 MG tablet TAKE 1 TABLET BY MOUTH DAILY AND 2 TABLETS EVERY NIGHT 90 tablet 2   No current facility-administered medications on file prior to visit.   Allergies  Allergen Reactions   Vicodin [Hydrocodone-Acetaminophen] Other (See Comments)    Makes her feel like she cannot breath, but no throat swelling. No issues with plain Tylenol   Social History   Socioeconomic History   Marital status: Single    Spouse name: Not on file   Number of children: Not on file   Years of education: Not on file   Highest education level: Not on file  Occupational History   Not on file  Tobacco Use   Smoking status: Every Day    Years: 28.00    Types: Cigarettes   Smokeless tobacco: Never   Tobacco comments:    1 pack lasts 3 days.  Substance and Sexual Activity   Alcohol use: Yes    Comment: occ    Drug use: No   Sexual activity: Not on file  Other Topics Concern   Not on file  Social History Narrative   Leave with  boyfriend   1-story home 2 steps at the front   1-cup a day   Social Determinants of Radio broadcast assistant Strain: Not on file  Food Insecurity: Not on file  Transportation Needs: Not on file  Physical Activity: Not on file  Stress: Not on file  Social Connections: Not on file  Intimate Partner Violence: Not on file      Review of Systems  All other systems reviewed and are negative.     Objective:   Physical Exam Vitals reviewed.  Constitutional:      General: She is not in acute distress.    Appearance: Normal appearance. She is obese. She is not ill-appearing, toxic-appearing or diaphoretic.  Cardiovascular:     Rate and Rhythm: Normal rate and regular rhythm.     Heart sounds: Normal heart sounds. No murmur heard.   No friction rub. No gallop.  Pulmonary:     Effort: Pulmonary  effort is normal. No respiratory distress.     Breath sounds: Normal breath sounds. No stridor. No wheezing, rhonchi or rales.  Chest:     Chest wall: No tenderness.  Abdominal:     General: Bowel sounds are normal. There is no distension.     Palpations: Abdomen is soft.     Tenderness: There is no abdominal tenderness. There is no guarding or rebound.  Musculoskeletal:     Right lower leg: No edema.     Left lower leg: No edema.  Neurological:     General: No focal deficit present.     Mental Status: She is alert and oriented to person, place, and time. Mental status is at baseline.     Cranial Nerves: No cranial nerve deficit.     Motor: No weakness.     Coordination: Coordination normal.     Gait: Gait normal.          Assessment & Plan:  Situational mixed anxiety and depressive disorder Begin Lexapro 10 mg p.o. daily and use Xanax 0.5 mg every 8 hours as needed for anxiety.  Cautioned the patient about habituation and dependency regarding the Xanax.  Reassess in 4 to 6 weeks or sooner if worsening.

## 2021-04-11 ENCOUNTER — Other Ambulatory Visit: Payer: Self-pay | Admitting: Family Medicine

## 2021-08-06 ENCOUNTER — Ambulatory Visit: Payer: Managed Care, Other (non HMO) | Admitting: Family Medicine

## 2021-08-06 ENCOUNTER — Encounter: Payer: Self-pay | Admitting: Family Medicine

## 2021-08-06 VITALS — BP 126/70 | HR 84 | Ht 65.0 in | Wt 181.0 lb

## 2021-08-06 DIAGNOSIS — Z1211 Encounter for screening for malignant neoplasm of colon: Secondary | ICD-10-CM

## 2021-08-06 DIAGNOSIS — Z1231 Encounter for screening mammogram for malignant neoplasm of breast: Secondary | ICD-10-CM | POA: Diagnosis not present

## 2021-08-06 DIAGNOSIS — D509 Iron deficiency anemia, unspecified: Secondary | ICD-10-CM

## 2021-08-06 DIAGNOSIS — D229 Melanocytic nevi, unspecified: Secondary | ICD-10-CM | POA: Diagnosis not present

## 2021-08-06 NOTE — Progress Notes (Signed)
Subjective:    Patient ID: Cassie Lynn, female    DOB: 06/19/1970, 51 y.o.   MRN: [id number]  HPI   Patient would like a referral to a dermatologist.  She has 3 brown benign moles on her chin.  These are located just below her left.  She would like these removed for cosmetic reasons.  I do not believe that I would give her a cosmetically pleasing scar and I will be glad to get her into see a dermatologist to have them removed.  She is overdue for mammogram.  She is overdue for colon cancer screening.  She is also due for fasting lab work to monitor her iron deficiency anemia. 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  Takes Depakote   Shortness of breath dyspnea    Past Surgical History:  Procedure Laterality Date   CESAREAN SECTION     CHOLECYSTECTOMY     IR RADIOLOGY PERIPHERAL GUIDED IV START  10/13/2017   IR US GUIDE VASC ACCESS RIGHT  10/13/2017   LUMBAR LAMINECTOMY/DECOMPRESSION MICRODISCECTOMY Left 04/18/2015   Procedure: Left Lumbar five sacral-one  Microdiskectomy;  Surgeon: Kevan Ny Ditty, MD;  Location: [facility];  Service: Neurosurgery;  Laterality: Left;   Current Outpatient Medications on File Prior to Visit  Medication Sig Dispense Refill   ALPRAZolam (XANAX) 0.5 MG tablet Take 1 tablet (0.5 mg total) by mouth 3 (three) times daily as needed. 30 tablet 0   amLODipine (NORVASC) 10 MG tablet Take by mouth.     clopidogrel (PLAVIX) 75 MG tablet TAKE 1 TABLET BY MOUTH EVERY DAY 90 tablet 3   cyclobenzaprine (FLEXERIL) 5 MG tablet Take by mouth.     escitalopram (LEXAPRO) 10 MG tablet TAKE 1 TABLET BY MOUTH EVERY DAY 90 tablet 2   gabapentin (NEURONTIN) 300 MG capsule TAKE 1 CAPSULE BY MOUTH THREE TIMES A DAY 270 capsule 0   losartan (COZAAR) 100 MG tablet TAKE 1/2 TABLET BY MOUTH EVERY DAY 90 tablet 1    metoprolol tartrate (LOPRESSOR) 50 MG tablet Take 25 mg by mouth daily.     rosuvastatin (CRESTOR) 20 MG tablet Take 1 tablet (20 mg total) by mouth daily. 90 tablet 1   SUMAtriptan (IMITREX) 100 MG tablet sumatriptan 100 mg tablet     topiramate (TOPAMAX) 50 MG tablet TAKE 1 TABLET BY MOUTH DAILY AND 2 TABLETS EVERY NIGHT 90 tablet 2   No current facility-administered medications on file prior to visit.   Allergies  Allergen Reactions   Vicodin [Hydrocodone-Acetaminophen] Other (See Comments)    Makes her feel like she cannot breath, but no throat swelling. No issues with plain Tylenol   Social History   Socioeconomic History   Marital status: Single    Spouse name: Not on file   Number of children: Not on file   Years of education: Not on file   Highest education level: Not on file  Occupational History   Not on file  Tobacco Use   Smoking status: Every Day    Years: 28.00    Types: Cigarettes   Smokeless tobacco: Never   Tobacco comments:    1 pack lasts 3 days.  Substance and Sexual Activity   Alcohol use: Yes    Comment: occ  Drug use: No   Sexual activity: Not on file  Other Topics Concern   Not on file  Social History Narrative   Leave with boyfriend   1-story home 2 steps at the front   1-cup a day   Social Determinants of Health   Financial Resource Strain: Not on file  Food Insecurity: Not on file  Transportation Needs: Not on file  Physical Activity: Not on file  Stress: Not on file  Social Connections: Not on file  Intimate Partner Violence: Not on file      Review of Systems  All other systems reviewed and are negative.      Objective:   Physical Exam Vitals reviewed.  Constitutional:      General: She is not in acute distress.    Appearance: Normal appearance. She is obese. She is not ill-appearing, toxic-appearing or diaphoretic.  Cardiovascular:     Rate and Rhythm: Normal rate and regular rhythm.     Heart sounds: Normal heart  sounds. No murmur heard.    No friction rub. No gallop.  Pulmonary:     Effort: Pulmonary effort is normal. No respiratory distress.     Breath sounds: Normal breath sounds. No stridor. No wheezing, rhonchi or rales.  Chest:     Chest wall: No tenderness.  Abdominal:     General: Bowel sounds are normal. There is no distension.     Palpations: Abdomen is soft.     Tenderness: There is no abdominal tenderness. There is no guarding or rebound.  Musculoskeletal:     Right lower leg: No edema.     Left lower leg: No edema.  Neurological:     General: No focal deficit present.     Mental Status: She is alert and oriented to person, place, and time. Mental status is at baseline.     Cranial Nerves: No cranial nerve deficit.     Motor: No weakness.     Coordination: Coordination normal.     Gait: Gait normal.           Assessment & Plan:   Benign pigmented mole - Plan: Ambulatory referral to Dermatology  Iron deficiency anemia, unspecified iron deficiency anemia type - Plan: CBC with Differential/Platelet, COMPLETE METABOLIC PANEL WITH GFR, Iron  Colon cancer screening - Plan: Cologuard  Encounter for screening mammogram for malignant neoplasm of breast - Plan: MM Digital Screening I will consult dermatology regarding the moles on her chin to see if they can remove them.  I believe that this would give her a better cosmetic appearance and what I am able to do.  I will check a CBC and an iron level to monitor her iron deficiency anemia.  I will check a Cologuard to screen for colon cancer.  I will schedule the patient for mammogram.

## 2021-08-07 LAB — CBC WITH DIFFERENTIAL/PLATELET
Absolute Monocytes: 748 cells/uL (ref 200–950)
Basophils Absolute: 104 cells/uL (ref 0–200)
Basophils Relative: 0.9 %
Eosinophils Absolute: 288 cells/uL (ref 15–500)
Eosinophils Relative: 2.5 %
HCT: 30.7 % — ABNORMAL LOW (ref 35.0–45.0)
Hemoglobin: 9.4 g/dL — ABNORMAL LOW (ref 11.7–15.5)
Lymphs Abs: 3013 cells/uL (ref 850–3900)
MCH: 23.4 pg — ABNORMAL LOW (ref 27.0–33.0)
MCHC: 30.6 g/dL — ABNORMAL LOW (ref 32.0–36.0)
MCV: 76.4 fL — ABNORMAL LOW (ref 80.0–100.0)
MPV: 11.2 fL (ref 7.5–12.5)
Monocytes Relative: 6.5 %
Neutro Abs: 7349 cells/uL (ref 1500–7800)
Neutrophils Relative %: 63.9 %
Platelets: 378 10*3/uL (ref 140–400)
RBC: 4.02 10*6/uL (ref 3.80–5.10)
RDW: 16.2 % — ABNORMAL HIGH (ref 11.0–15.0)
Total Lymphocyte: 26.2 %
WBC: 11.5 10*3/uL — ABNORMAL HIGH (ref 3.8–10.8)

## 2021-08-07 LAB — COMPLETE METABOLIC PANEL WITH GFR
AG Ratio: 1.6 (calc) (ref 1.0–2.5)
ALT: 10 U/L (ref 6–29)
AST: 13 U/L (ref 10–35)
Albumin: 4 g/dL (ref 3.6–5.1)
Alkaline phosphatase (APISO): 44 U/L (ref 37–153)
BUN: 10 mg/dL (ref 7–25)
CO2: 24 mmol/L (ref 20–32)
Calcium: 9.1 mg/dL (ref 8.6–10.4)
Chloride: 109 mmol/L (ref 98–110)
Creat: 0.82 mg/dL (ref 0.50–1.03)
Globulin: 2.5 g/dL (calc) (ref 1.9–3.7)
Glucose, Bld: 126 mg/dL — ABNORMAL HIGH (ref 65–99)
Potassium: 3.9 mmol/L (ref 3.5–5.3)
Sodium: 140 mmol/L (ref 135–146)
Total Bilirubin: 0.4 mg/dL (ref 0.2–1.2)
Total Protein: 6.5 g/dL (ref 6.1–8.1)
eGFR: 87 mL/min/{1.73_m2} (ref >=60)

## 2021-08-07 LAB — IRON: Iron: 32 ug/dL — ABNORMAL LOW (ref 45–160)

## 2021-08-13 ENCOUNTER — Ambulatory Visit
Admission: RE | Admit: 2021-08-13 | Discharge: 2021-08-13 | Disposition: A | Discharge status: Home / Self Care | Payer: Managed Care, Other (non HMO) | Source: Ambulatory Visit | Attending: Family Medicine | Admitting: Family Medicine

## 2021-08-13 DIAGNOSIS — Z1231 Encounter for screening mammogram for malignant neoplasm of breast: Secondary | ICD-10-CM

## 2021-08-17 ENCOUNTER — Other Ambulatory Visit: Payer: Self-pay | Admitting: Family Medicine

## 2021-08-17 DIAGNOSIS — R928 Other abnormal and inconclusive findings on diagnostic imaging of breast: Secondary | ICD-10-CM

## 2021-09-01 ENCOUNTER — Other Ambulatory Visit: Payer: Managed Care, Other (non HMO)

## 2021-09-04 ENCOUNTER — Ambulatory Visit: Payer: Managed Care, Other (non HMO)

## 2021-09-04 ENCOUNTER — Ambulatory Visit
Admission: RE | Admit: 2021-09-04 | Discharge: 2021-09-04 | Disposition: A | Discharge status: Home / Self Care | Payer: Managed Care, Other (non HMO) | Source: Ambulatory Visit | Attending: Family Medicine | Admitting: Family Medicine

## 2021-09-04 DIAGNOSIS — R928 Other abnormal and inconclusive findings on diagnostic imaging of breast: Secondary | ICD-10-CM

## 2021-10-08 ENCOUNTER — Other Ambulatory Visit: Payer: Managed Care, Other (non HMO)

## 2021-10-08 DIAGNOSIS — D509 Iron deficiency anemia, unspecified: Secondary | ICD-10-CM

## 2021-10-08 DIAGNOSIS — M5416 Radiculopathy, lumbar region: Secondary | ICD-10-CM

## 2021-10-09 LAB — IRON,TIBC AND FERRITIN PANEL
%SAT: 4 % (calc) — ABNORMAL LOW (ref 16–45)
Ferritin: 6 ng/mL — ABNORMAL LOW (ref 16–232)
Iron: 21 ug/dL — ABNORMAL LOW (ref 45–160)
TIBC: 484 mcg/dL (calc) — ABNORMAL HIGH (ref 250–450)

## 2021-10-12 ENCOUNTER — Telehealth: Payer: Self-pay

## 2021-10-12 ENCOUNTER — Other Ambulatory Visit: Payer: Self-pay | Admitting: Family Medicine

## 2021-10-12 NOTE — Telephone Encounter (Signed)
Per Maudie Mercury in lab, CBC can't be added after 24 hours. Would you like me to call pt and have her come in for lab draw? Thanks, MJ

## 2021-10-13 ENCOUNTER — Other Ambulatory Visit: Payer: Managed Care, Other (non HMO)

## 2021-10-13 DIAGNOSIS — D509 Iron deficiency anemia, unspecified: Secondary | ICD-10-CM

## 2021-10-13 LAB — CBC WITH DIFFERENTIAL/PLATELET
Absolute Monocytes: 650 cells/uL (ref 200–950)
Basophils Absolute: 123 cells/uL (ref 0–200)
Basophils Relative: 1.1 %
Eosinophils Absolute: 347 cells/uL (ref 15–500)
Eosinophils Relative: 3.1 %
HCT: 29.5 % — ABNORMAL LOW (ref 35.0–45.0)
Hemoglobin: 9.3 g/dL — ABNORMAL LOW (ref 11.7–15.5)
Lymphs Abs: 3909 cells/uL — ABNORMAL HIGH (ref 850–3900)
MCH: 24.5 pg — ABNORMAL LOW (ref 27.0–33.0)
MCHC: 31.5 g/dL — ABNORMAL LOW (ref 32.0–36.0)
MCV: 77.8 fL — ABNORMAL LOW (ref 80.0–100.0)
MPV: 12 fL (ref 7.5–12.5)
Monocytes Relative: 5.8 %
Neutro Abs: 6171 cells/uL (ref 1500–7800)
Neutrophils Relative %: 55.1 %
Platelets: 290 10*3/uL (ref 140–400)
RBC: 3.79 10*6/uL — ABNORMAL LOW (ref 3.80–5.10)
RDW: 17.9 % — ABNORMAL HIGH (ref 11.0–15.0)
Total Lymphocyte: 34.9 %
WBC: 11.2 10*3/uL — ABNORMAL HIGH (ref 3.8–10.8)

## 2021-10-16 ENCOUNTER — Other Ambulatory Visit: Payer: Self-pay

## 2021-10-16 ENCOUNTER — Telehealth: Payer: Self-pay | Admitting: Oncology

## 2021-10-16 DIAGNOSIS — D509 Iron deficiency anemia, unspecified: Secondary | ICD-10-CM

## 2021-10-16 NOTE — Telephone Encounter (Signed)
Scheduled appt per 8/25 referral. Pt is aware of appt date and time. Pt is aware to arrive 15 mins prior to appt time and to bring and updated insurance card. Pt is aware of appt location.   

## 2021-11-02 NOTE — Progress Notes (Unsigned)
[facility] Initial Visit:  Patient Care Team: Susy Frizzle, MD as PCP - General (Family Medicine)  CHIEF COMPLAINTS/PURPOSE OF CONSULTATION:  Evaluation of anemia  Oncology History   No history exists.    HISTORY OF PRESENTING ILLNESS: Cassie Lynn 51 y.o. female is here because of  anemia Medical history notable for brain aneurysm, Buerger's disease, peripheral vascular disease, fibromyalgia August 06, 2021: CMP notable for glucose 126 October 13, 2021: WBC 11.2 hemoglobin 9.3 MCV 78 platelet count 290; 55 segs 35 lymphs 6 monos 3 eos 1 basophil.  Ferritin 6  November 06 2021:  [facility] Hematology Consult  She states that she has been anemic for quite awhile  Patient is G6  P3033 last pregnancy was 26 yrs ago.  Menopause not reached.  In the past menses occurred regularly but in the last few months have been intermittent.  Bleeding lasts 3 to 7 days and is heavy with passages of clots.  Last year underwent a D&C to manage heavy bleeding.  Last pregnancy was delivered by NSVD.  Has taken oral iron which she can tolerate but states that it her Hgb just doesn't go up.  Has never received IV iron nor required PRBC's in the past.  Has a regular diet.  No history/ history of postpartum hemorrhage requiring transfusion.   No history hemorrhage postoperatively.  No hematochezia, melena, hemoptysis, hematuria.  No history of intra-articular or soft tissue bleeding No history of abnormal bleeding in family members Patient has symptoms of fatigue, pallor,  DOE, decreased performance status.  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    Underwent EGD in the past but has not had a colonoscopy. Has tried OCP's but states that did not help.    Social:  Began smoking at 35 yoa, smokes 1/2 ppd now but used to smoke 2 1/2 ppd.  EtOH occasionally  [facility] Mother died 59 mesothelioma worked in [employer] Father alive 73 well Sister alive 58 bipolar disorder, cerebral  palsy   Review of Systems - Oncology  MEDICAL HISTORY: Past Medical History:  Diagnosis Date   Anemia    Aneurysm ([place])    brain- 2015ish was gone   Buerger's disease ([place])    pvd with Raynaud's phenomenon   Fibromyalgia    Migraine    Migraines.  Takes Depakote   Shortness of breath dyspnea     SURGICAL HISTORY: Past Surgical History:  Procedure Laterality Date   CESAREAN SECTION     CHOLECYSTECTOMY     IR RADIOLOGY PERIPHERAL GUIDED IV START  10/13/2017   IR US GUIDE VASC ACCESS RIGHT  10/13/2017   LUMBAR LAMINECTOMY/DECOMPRESSION MICRODISCECTOMY Left 04/18/2015   Procedure: Left Lumbar five sacral-one  Microdiskectomy;  Surgeon: Kevan Ny Ditty, MD;  Location: [place] [facility];  Service: Neurosurgery;  Laterality: Left;    SOCIAL HISTORY: Social History   Socioeconomic History   Marital status: Single    Spouse name: Not on file   Number of children: Not on file   Years of education: Not on file   Highest education level: Not on file  Occupational History   Not on file  Tobacco Use   Smoking status: Every Day    Years: 28.00    Types: Cigarettes   Smokeless tobacco: Never   Tobacco comments:    1 pack lasts 3 days.  Vaping Use   Vaping Use: Never used  Substance and Sexual Activity   Alcohol use: Yes  Comment: occ    Drug use: No   Sexual activity: Not on file  Other Topics Concern   Not on file  Social History Narrative   Leave with boyfriend   1-story home 2 steps at the front   1-cup a day   Social Determinants of Health   Financial Resource Strain: Not on file  Food Insecurity: Not on file  Transportation Needs: Not on file  Physical Activity: Not on file  Stress: Not on file  Social Connections: Not on file  Intimate Partner Violence: Not on file    FAMILY HISTORY Family History  Problem Relation Age of Onset   Hypertension Mother    Hypertension Father    Hypertension Sister     ALLERGIES:  is allergic to vicodin  [hydrocodone-acetaminophen].  MEDICATIONS:  Current Outpatient Medications  Medication Sig Dispense Refill   ALPRAZolam (XANAX) 0.5 MG tablet Take 1 tablet (0.5 mg total) by mouth 3 (three) times daily as needed. 30 tablet 0   amLODipine (NORVASC) 10 MG tablet Take by mouth.     clopidogrel (PLAVIX) 75 MG tablet TAKE 1 TABLET BY MOUTH EVERY DAY 90 tablet 3   cyclobenzaprine (FLEXERIL) 5 MG tablet Take by mouth.     escitalopram (LEXAPRO) 10 MG tablet TAKE 1 TABLET BY MOUTH EVERY DAY 90 tablet 2   gabapentin (NEURONTIN) 300 MG capsule TAKE 1 CAPSULE BY MOUTH THREE TIMES A DAY 270 capsule 0   losartan (COZAAR) 100 MG tablet TAKE 1/2 TABLET BY MOUTH EVERY DAY 90 tablet 1   metoprolol tartrate (LOPRESSOR) 50 MG tablet Take 25 mg by mouth daily.     rosuvastatin (CRESTOR) 20 MG tablet Take 1 tablet (20 mg total) by mouth daily. 90 tablet 1   SUMAtriptan (IMITREX) 100 MG tablet sumatriptan 100 mg tablet     topiramate (TOPAMAX) 50 MG tablet TAKE 1 TABLET BY MOUTH DAILY AND 2 TABLETS EVERY NIGHT 90 tablet 2   No current facility-administered medications for this visit.    PHYSICAL EXAMINATION:  ECOG PERFORMANCE STATUS: {CHL ONC ECOG PS:[id number]}   There were no vitals filed for this visit.  There were no vitals filed for this visit.   Physical Exam Vitals and nursing note reviewed.  Constitutional:      Appearance: Normal appearance. She is not toxic-appearing or diaphoretic.     Comments: Here alone  HENT:     Head: Normocephalic and atraumatic.     Right Ear: External ear normal.     Left Ear: External ear normal.     Nose: Nose normal. No congestion or rhinorrhea.  Eyes:     General: No scleral icterus.    Extraocular Movements: Extraocular movements intact.     Conjunctiva/sclera: Conjunctivae normal.     Pupils: Pupils are equal, round, and reactive to light.  Cardiovascular:     Rate and Rhythm: Normal rate and regular rhythm.     Pulses: Normal pulses.     Heart  sounds: Normal heart sounds. No murmur heard.    No friction rub. No gallop.  Pulmonary:     Effort: Pulmonary effort is normal. No respiratory distress.     Breath sounds: Normal breath sounds. No stridor. No wheezing, rhonchi or rales.  Abdominal:     General: Bowel sounds are normal. There is no distension.     Palpations: Abdomen is soft. There is no mass.     Tenderness: There is no abdominal tenderness. There is no guarding or rebound.  Hernia: No hernia is present.  Musculoskeletal:        General: No swelling, tenderness or deformity.     Cervical back: Normal range of motion and neck supple. No rigidity or tenderness.  Lymphadenopathy:     Head:     Right side of head: No submental, submandibular, tonsillar, preauricular, posterior auricular or occipital adenopathy.     Left side of head: No submental, submandibular, tonsillar, preauricular, posterior auricular or occipital adenopathy.     Cervical: No cervical adenopathy.     Right cervical: No superficial, deep or posterior cervical adenopathy.    Left cervical: No superficial, deep or posterior cervical adenopathy.     Upper Body:     Right upper body: No supraclavicular, axillary, pectoral or epitrochlear adenopathy.     Left upper body: No supraclavicular, axillary, pectoral or epitrochlear adenopathy.  Skin:    General: Skin is warm.     Coloration: Skin is pale. Skin is not jaundiced.     Findings: No bruising, erythema, lesion or rash.  Neurological:     General: No focal deficit present.     Mental Status: She is alert and oriented to person, place, and time. Mental status is at baseline.     Cranial Nerves: No cranial nerve deficit.     Sensory: No sensory deficit.     Motor: No weakness.  Psychiatric:        Mood and Affect: Mood normal.        Behavior: Behavior normal.        Thought Content: Thought content normal.        Judgment: Judgment normal.    LABORATORY DATA: I have personally reviewed the  data as listed:  Lab on 10/13/2021  Component Date Value Ref Range Status   WBC 10/13/2021 11.2 (H)  3.8 - 10.8 Thousand/uL Final   RBC 10/13/2021 3.79 (L)  3.80 - 5.10 Million/uL Final   Hemoglobin 10/13/2021 9.3 (L)  11.7 - 15.5 g/dL Final   HCT 10/13/2021 29.5 (L)  35.0 - 45.0 % Final   MCV 10/13/2021 77.8 (L)  80.0 - 100.0 fL Final   MCH 10/13/2021 24.5 (L)  27.0 - 33.0 pg Final   MCHC 10/13/2021 31.5 (L)  32.0 - 36.0 g/dL Final   RDW 10/13/2021 17.9 (H)  11.0 - 15.0 % Final   Platelets 10/13/2021 290  140 - 400 Thousand/uL Final   MPV 10/13/2021 12.0  7.5 - 12.5 fL Final   Neutro Abs 10/13/2021 6,171  1,500 - 7,800 cells/uL Final   Lymphs Abs 10/13/2021 3,909 (H)  850 - 3,900 cells/uL Final   Absolute Monocytes 10/13/2021 650  200 - 950 cells/uL Final   Eosinophils Absolute 10/13/2021 347  15 - 500 cells/uL Final   Basophils Absolute 10/13/2021 123  0 - 200 cells/uL Final   Neutrophils Relative % 10/13/2021 55.1  % Final   Total Lymphocyte 10/13/2021 34.9  % Final   Monocytes Relative 10/13/2021 5.8  % Final   Eosinophils Relative 10/13/2021 3.1  % Final   Basophils Relative 10/13/2021 1.1  % Final  Lab on 10/08/2021  Component Date Value Ref Range Status   Iron 10/08/2021 21 (L)  45 - 160 mcg/dL Final   TIBC 10/08/2021 484 (H)  250 - 450 mcg/dL (calc) Final   %SAT 10/08/2021 4 (L)  16 - 45 % (calc) Final   Ferritin 10/08/2021 6 (L)  16 - 232 ng/mL Final    RADIOGRAPHIC STUDIES: I have  personally reviewed the radiological images as listed and agree with the findings in the report  No results found.  ASSESSMENT/PLAN  Patient is a year old  female/female with symptomatic microcytic anemia presumed to be secondary to chronic blood loss.    Anemia:  Most likely etiology is iron deficiency anemia owing to dysfunctional uterine bleeding.  Will obtain CBC with diff, CMP, Ferritin, B12, folate, retic count,  DAT, Haptoglobin Will also refer to GI for consideration of endoscopy  given patient's age  Therapeutics:  Since patient has symptomatic anemia with Hgb < 10  and has not tolerated/been compliant with oral iron will arrange for IV iron replacement.   Given the severe symptoms we prefer to replete iron stores in one or two visits rather than over the course of several months.  In addition ongoing blood loss exceeds the capacity of oral iron to meet needs. A discussion regarding risks was had with the patient.  IV iron has the potential to cause allergic reactions, including potentially life-threatening anaphylaxis.   IV iron may be associated with non-allergic infusion reactions including self-limiting urticaria, palpitations, dizziness, and neck and back spasm; generally, these occur in <1 percent of individuals and do not progress to more serious reactions. The non-allergic reaction consisting of flushing of the face and myalgias of the chest and back.   After discussion of the risks and benefits of IV iron therapy patient has elected to proceed with parenteral iron therapy.    Dysfunctional uterine bleeding:  Has been seen by Gynecology for this.  Exacerbated by antiplatelet effects of plavix and lexapro   Cancer Staging  No matching staging information was found for the patient.   No problem-specific Assessment & Plan notes found for this encounter.   No orders of the defined types were placed in this encounter.   All questions were answered. The patient knows to call the clinic with any problems, questions or concerns.  This note was electronically signed.    Barbee Cough, MD  11/02/2021 3:07 PM

## 2021-11-06 ENCOUNTER — Other Ambulatory Visit: Payer: Self-pay

## 2021-11-06 ENCOUNTER — Inpatient Hospital Stay: Payer: Managed Care, Other (non HMO) | Attending: Oncology | Admitting: Oncology

## 2021-11-06 DIAGNOSIS — D539 Nutritional anemia, unspecified: Secondary | ICD-10-CM | POA: Insufficient documentation

## 2021-11-06 DIAGNOSIS — Z7902 Long term (current) use of antithrombotics/antiplatelets: Secondary | ICD-10-CM | POA: Diagnosis not present

## 2021-11-06 DIAGNOSIS — D649 Anemia, unspecified: Secondary | ICD-10-CM | POA: Insufficient documentation

## 2021-11-06 DIAGNOSIS — Z79899 Other long term (current) drug therapy: Secondary | ICD-10-CM | POA: Insufficient documentation

## 2021-11-06 DIAGNOSIS — N938 Other specified abnormal uterine and vaginal bleeding: Secondary | ICD-10-CM | POA: Insufficient documentation

## 2021-11-06 DIAGNOSIS — D509 Iron deficiency anemia, unspecified: Secondary | ICD-10-CM | POA: Insufficient documentation

## 2021-11-06 DIAGNOSIS — F17210 Nicotine dependence, cigarettes, uncomplicated: Secondary | ICD-10-CM | POA: Insufficient documentation

## 2021-11-06 DIAGNOSIS — D500 Iron deficiency anemia secondary to blood loss (chronic): Secondary | ICD-10-CM

## 2021-11-09 ENCOUNTER — Telehealth: Payer: Self-pay | Admitting: Oncology

## 2021-11-09 ENCOUNTER — Other Ambulatory Visit: Payer: Self-pay

## 2021-11-09 ENCOUNTER — Encounter: Payer: Self-pay | Admitting: Oncology

## 2021-11-09 ENCOUNTER — Inpatient Hospital Stay: Payer: Managed Care, Other (non HMO)

## 2021-11-09 DIAGNOSIS — D500 Iron deficiency anemia secondary to blood loss (chronic): Secondary | ICD-10-CM

## 2021-11-09 DIAGNOSIS — D509 Iron deficiency anemia, unspecified: Secondary | ICD-10-CM | POA: Diagnosis not present

## 2021-11-09 LAB — CBC WITH DIFFERENTIAL (CANCER CENTER ONLY)
Abs Immature Granulocytes: 0.11 10*3/uL — ABNORMAL HIGH (ref 0.00–0.07)
Basophils Absolute: 0.1 10*3/uL (ref 0.0–0.1)
Basophils Relative: 1 %
Eosinophils Absolute: 0.2 10*3/uL (ref 0.0–0.5)
Eosinophils Relative: 1 %
HCT: 33.1 % — ABNORMAL LOW (ref 36.0–46.0)
Hemoglobin: 10.2 g/dL — ABNORMAL LOW (ref 12.0–15.0)
Immature Granulocytes: 1 %
Lymphocytes Relative: 29 %
Lymphs Abs: 3.5 10*3/uL (ref 0.7–4.0)
MCH: 24.5 pg — ABNORMAL LOW (ref 26.0–34.0)
MCHC: 30.8 g/dL (ref 30.0–36.0)
MCV: 79.4 fL — ABNORMAL LOW (ref 80.0–100.0)
Monocytes Absolute: 0.5 10*3/uL (ref 0.1–1.0)
Monocytes Relative: 4 %
Neutro Abs: 7.6 10*3/uL (ref 1.7–7.7)
Neutrophils Relative %: 64 %
Platelet Count: 351 10*3/uL (ref 150–400)
RBC: 4.17 MIL/uL (ref 3.87–5.11)
RDW: 18.4 % — ABNORMAL HIGH (ref 11.5–15.5)
WBC Count: 12 10*3/uL — ABNORMAL HIGH (ref 4.0–10.5)
nRBC: 0 % (ref 0.0–0.2)

## 2021-11-09 LAB — TECHNOLOGIST SMEAR REVIEW

## 2021-11-09 LAB — DIRECT ANTIGLOBULIN TEST (NOT AT ARMC)
DAT, IgG: NEGATIVE
DAT, complement: NEGATIVE

## 2021-11-09 LAB — VITAMIN B12: Vitamin B-12: 257 pg/mL (ref 180–914)

## 2021-11-09 LAB — FIBRINOGEN: Fibrinogen: 346 mg/dL (ref 210–475)

## 2021-11-09 LAB — RETICULOCYTES
Immature Retic Fract: 23 % — ABNORMAL HIGH (ref 2.3–15.9)
RBC.: 4.15 MIL/uL (ref 3.87–5.11)
Retic Count, Absolute: 49.4 10*3/uL (ref 19.0–186.0)
Retic Ct Pct: 1.2 % (ref 0.4–3.1)

## 2021-11-09 LAB — FOLATE: Folate: 10.1 ng/mL (ref >5.9)

## 2021-11-09 NOTE — Telephone Encounter (Signed)
Scheduled appt per 9/15 los. Pt is aware.

## 2021-11-10 LAB — FERRITIN
Ferritin: 5 ng/mL — ABNORMAL LOW (ref 11–307)
Ferritin: 5 ng/mL — ABNORMAL LOW (ref 11–307)

## 2021-11-10 LAB — VON WILLEBRAND PANEL
Coagulation Factor VIII: 153 % — ABNORMAL HIGH (ref 56–140)
Ristocetin Co-factor, Plasma: 82 % (ref 50–200)
Von Willebrand Antigen, Plasma: 124 % (ref 50–200)

## 2021-11-10 LAB — HAPTOGLOBIN: Haptoglobin: 245 mg/dL (ref 33–346)

## 2021-11-10 LAB — COAG STUDIES INTERP REPORT

## 2021-11-12 ENCOUNTER — Encounter: Payer: Self-pay | Admitting: Oncology

## 2021-12-17 NOTE — Progress Notes (Signed)
[facility] Follow up Visit:  Patient Care Team: Susy Frizzle, MD as PCP - General (Family Medicine)  CHIEF COMPLAINTS/PURPOSE OF CONSULTATION:  Evaluation of anemia  Oncology History   No history exists.    HISTORY OF PRESENTING ILLNESS: Cassie Lynn 51 y.o. female is here because of  anemia Medical history notable for brain aneurysm, Buerger's disease, peripheral vascular disease, fibromyalgia August 06, 2021: CMP notable for glucose 126 October 13, 2021: WBC 11.2 hemoglobin 9.3 MCV 78 platelet count 290; 55 segs 35 lymphs 6 monos 3 eos 1 basophil.  Ferritin 6  November 06 2021:  [facility] Hematology Consult  She states that she has been anemic for quite awhile  Patient is G6  [id number] last pregnancy was 26 yrs ago.  Menopause not reached.  In the past menses occurred regularly but in the last few months have been intermittent.  Bleeding lasts 3 to 7 days and is heavy with passages of clots.  Last year underwent a D&C to manage heavy bleeding.  Last pregnancy was delivered by NSVD.  Has taken oral iron which she can tolerate but states that it her Hgb just doesn't go up.  Has never received IV iron nor required PRBC's in the past.  Has a regular diet.  No history/ history of postpartum hemorrhage requiring transfusion.   No history hemorrhage postoperatively.  No hematochezia, melena, hemoptysis, hematuria.  No history of intra-articular or soft tissue bleeding No history of abnormal bleeding in family members Patient has symptoms of fatigue, pallor,  DOE, decreased performance status.  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    Underwent EGD in the past but has not had a colonoscopy. Has tried OCP's but states that did not help.    Social:  Began smoking at 89 yoa, smokes 1/2 ppd now but used to smoke 2 1/2 ppd.  EtOH occasionally  [facility] Mother died 13 mesothelioma worked in [employer] Father alive 35 well Sister alive 67 bipolar disorder, cerebral  palsy  WBC 12.0 hemoglobin 10.2 MCV 79 platelet count 351; 64 segs 29 lymphs 4 monos 1 EO.  Reticulocytes 1.2%  ferritin 5 B12 257 folate 9.1 Haptoglobin 245 Coombs test negative Fibrinogen 346 Factor VIII 153 von Willebrand factor antigen 124 activity 82   December 18 2021:  Scheduled follow up for management of anemia.  Reviewed results of labs with patient.  Has not yet received IV iron   Review of Systems - Oncology  MEDICAL HISTORY: Past Medical History:  Diagnosis Date   Anemia    Aneurysm ([place])    brain- 2015ish was gone   Buerger's disease ([place])    pvd with Raynaud's phenomenon   Fibromyalgia    Migraine    Migraines.  Takes Depakote   Shortness of breath dyspnea     SURGICAL HISTORY: Past Surgical History:  Procedure Laterality Date   CESAREAN SECTION     CHOLECYSTECTOMY     IR RADIOLOGY PERIPHERAL GUIDED IV START  10/13/2017   IR US GUIDE VASC ACCESS RIGHT  10/13/2017   LUMBAR LAMINECTOMY/DECOMPRESSION MICRODISCECTOMY Left 04/18/2015   Procedure: Left Lumbar five sacral-one  Microdiskectomy;  Surgeon: Kevan Ny Ditty, MD;  Location: [facility];  Service: Neurosurgery;  Laterality: Left;    SOCIAL HISTORY: Social History   Socioeconomic History   Marital status: Single    Spouse name: Not on file   Number of children: Not on file   Years of education: Not on  file   Highest education level: Not on file  Occupational History   Not on file  Tobacco Use   Smoking status: Every Day    Years: 28.00    Types: Cigarettes   Smokeless tobacco: Never   Tobacco comments:    1 pack lasts 3 days.  Vaping Use   Vaping Use: Never used  Substance and Sexual Activity   Alcohol use: Yes    Comment: occ    Drug use: No   Sexual activity: Not on file  Other Topics Concern   Not on file  Social History Narrative   Leave with boyfriend   1-story home 2 steps at the front   1-cup a day   Social Determinants of Health   Financial Resource Strain: Not on  file  Food Insecurity: Not on file  Transportation Needs: Not on file  Physical Activity: Not on file  Stress: Not on file  Social Connections: Not on file  Intimate Partner Violence: Not on file    FAMILY HISTORY Family History  Problem Relation Age of Onset   Hypertension Mother    Hypertension Father    Hypertension Sister     ALLERGIES:  is allergic to vicodin [hydrocodone-acetaminophen].  MEDICATIONS:  Current Outpatient Medications  Medication Sig Dispense Refill   ALPRAZolam (XANAX) 0.5 MG tablet Take 1 tablet (0.5 mg total) by mouth 3 (three) times daily as needed. 30 tablet 0   amLODipine (NORVASC) 10 MG tablet Take by mouth.     clopidogrel (PLAVIX) 75 MG tablet TAKE 1 TABLET BY MOUTH EVERY DAY 90 tablet 3   cyclobenzaprine (FLEXERIL) 5 MG tablet Take by mouth.     escitalopram (LEXAPRO) 10 MG tablet TAKE 1 TABLET BY MOUTH EVERY DAY 90 tablet 2   gabapentin (NEURONTIN) 300 MG capsule TAKE 1 CAPSULE BY MOUTH THREE TIMES A DAY 270 capsule 0   losartan (COZAAR) 100 MG tablet TAKE 1/2 TABLET BY MOUTH EVERY DAY 90 tablet 1   metoprolol tartrate (LOPRESSOR) 50 MG tablet Take 25 mg by mouth daily.     rosuvastatin (CRESTOR) 20 MG tablet Take 1 tablet (20 mg total) by mouth daily. 90 tablet 1   SUMAtriptan (IMITREX) 100 MG tablet sumatriptan 100 mg tablet     topiramate (TOPAMAX) 50 MG tablet TAKE 1 TABLET BY MOUTH DAILY AND 2 TABLETS EVERY NIGHT 90 tablet 2   No current facility-administered medications for this visit.    PHYSICAL EXAMINATION:  ECOG PERFORMANCE STATUS: 1 - Symptomatic but completely ambulatory   There were no vitals filed for this visit.   There were no vitals filed for this visit.    Physical Exam Vitals and nursing note reviewed.  Constitutional:      Appearance: Normal appearance. She is not toxic-appearing or diaphoretic.     Comments: Here alone  HENT:     Head: Normocephalic and atraumatic.     Right Ear: External ear normal.     Left  Ear: External ear normal.     Nose: Nose normal. No congestion or rhinorrhea.  Eyes:     General: No scleral icterus.    Extraocular Movements: Extraocular movements intact.     Conjunctiva/sclera: Conjunctivae normal.     Pupils: Pupils are equal, round, and reactive to light.  Cardiovascular:     Rate and Rhythm: Normal rate and regular rhythm.     Pulses: Normal pulses.     Heart sounds: Normal heart sounds. No murmur heard.  No friction rub. No gallop.  Pulmonary:     Effort: Pulmonary effort is normal. No respiratory distress.     Breath sounds: Normal breath sounds. No stridor. No wheezing, rhonchi or rales.  Abdominal:     General: Bowel sounds are normal. There is no distension.     Palpations: Abdomen is soft. There is no mass.     Tenderness: There is no abdominal tenderness. There is no guarding or rebound.     Hernia: No hernia is present.  Musculoskeletal:        General: No swelling, tenderness or deformity.     Cervical back: Normal range of motion and neck supple. No rigidity or tenderness.  Lymphadenopathy:     Head:     Right side of head: No submental, submandibular, tonsillar, preauricular, posterior auricular or occipital adenopathy.     Left side of head: No submental, submandibular, tonsillar, preauricular, posterior auricular or occipital adenopathy.     Cervical: No cervical adenopathy.     Right cervical: No superficial, deep or posterior cervical adenopathy.    Left cervical: No superficial, deep or posterior cervical adenopathy.     Upper Body:     Right upper body: No supraclavicular, axillary, pectoral or epitrochlear adenopathy.     Left upper body: No supraclavicular, axillary, pectoral or epitrochlear adenopathy.  Skin:    General: Skin is warm.     Coloration: Skin is pale. Skin is not jaundiced.     Findings: No bruising, erythema, lesion or rash.  Neurological:     General: No focal deficit present.     Mental Status: She is alert and  oriented to person, place, and time. Mental status is at baseline.     Cranial Nerves: No cranial nerve deficit.     Sensory: No sensory deficit.     Motor: No weakness.  Psychiatric:        Mood and Affect: Mood normal.        Behavior: Behavior normal.        Thought Content: Thought content normal.        Judgment: Judgment normal.     LABORATORY DATA: I have personally reviewed the data as listed:  No visits with results within 1 Month(s) from this visit.  Latest known visit with results is:  Lab on 11/09/2021  Component Date Value Ref Range Status   Ferritin 11/09/2021 5 (L)  11 - 307 ng/mL Final   Performed at [facility], [address], [place], [place] [address]   WBC MORPHOLOGY 11/09/2021 MORPHOLOGY UNREMARKABLE   Final   RBC MORPHOLOGY 11/09/2021 POLYCHROMASIA PRESENT   Final   Plt Morphology 11/09/2021 LARGE PLATELETS PRESENT   Final   Clinical Information 11/09/2021 anemia   Final   Performed at [facility], [place] [address] [place], [place] [address]   Fibrinogen 11/09/2021 346  210 - 475 mg/dL Final   Comment: (NOTE) Fibrinogen results may be underestimated in patients receiving thrombolytic therapy. Performed at [facility], [place] [address], [place], [place] [address]    Coagulation Factor VIII 11/09/2021 153 (H)  56 - 140 % Final   Ristocetin Co-factor, Plasma 11/09/2021 82  50 - 200 % Final   Comment: (NOTE) Performed At: [facility] [place], [place] [id number] Rush Farmer MD OZ:[id number]    Von Willebrand Antigen, Plasma 11/09/2021 124  50 - 200 % Final   Comment: (NOTE) This test was developed and its  performance characteristics determined by [facility]. It has not been cleared or approved by the Food and Drug Administration.    Haptoglobin 11/09/2021 245  33 - 346 mg/dL Final   Comment: (NOTE) Performed At: [facility] [address] [place], [place]  [id number] Rush Farmer MD ME:[id number]    DAT, complement 11/09/2021 NEG   Final   DAT, IgG 11/09/2021    Final                   Value:NEG Performed at [facility], [place] [address], [place], [place] [address]    Retic Ct Pct 11/09/2021 1.2  0.4 - 3.1 % Final   RBC. 11/09/2021 4.15  3.87 - 5.11 MIL/uL Final   Retic Count, Absolute 11/09/2021 49.4  19.0 - 186.0 K/uL Final   Immature Retic Fract 11/09/2021 23.0 (H)  2.3 - 15.9 % Final   Performed at [facility], [place] [address], [place], [place] [address]   Vitamin B-12 11/09/2021 257  180 - 914 pg/mL Final   Comment: (NOTE) This assay is not validated for testing neonatal or myeloproliferative syndrome specimens for Vitamin B12 levels. Performed at [facility], [place] [address] [place], [place] [address]    Folate 11/09/2021 10.1  >5.9 ng/mL Final   Performed at [facility], [place] [address] [place], [place] [address]   Ferritin 11/09/2021 5 (L)  11 - 307 ng/mL Final   Performed at [facility], [place], [place], [place] [address]   WBC Count 11/09/2021 12.0 (H)  4.0 - 10.5 K/uL Final   RBC 11/09/2021 4.17  3.87 - 5.11 MIL/uL Final   Hemoglobin 11/09/2021 10.2 (L)  12.0 - 15.0 g/dL Final   HCT 11/09/2021 33.1 (L)  36.0 - 46.0 % Final   MCV 11/09/2021 79.4 (L)  80.0 - 100.0 fL Final   MCH 11/09/2021 24.5 (L)  26.0 - 34.0 pg Final   MCHC 11/09/2021 30.8  30.0 - 36.0 g/dL Final   RDW 11/09/2021 18.4 (H)  11.5 - 15.5 % Final   Platelet Count 11/09/2021 351  150 - 400 K/uL Final   nRBC 11/09/2021 0.0  0.0 - 0.2 % Final   Neutrophils Relative % 11/09/2021 64  % Final   Neutro Abs 11/09/2021 7.6  1.7 - 7.7 K/uL Final   Lymphocytes Relative 11/09/2021 29  % Final   Lymphs Abs 11/09/2021 3.5  0.7 - 4.0 K/uL Final   Monocytes Relative 11/09/2021 4  % Final   Monocytes Absolute 11/09/2021 0.5  0.1 - 1.0 K/uL Final   Eosinophils  Relative 11/09/2021 1  % Final   Eosinophils Absolute 11/09/2021 0.2  0.0 - 0.5 K/uL Final   Basophils Relative 11/09/2021 1  % Final   Basophils Absolute 11/09/2021 0.1  0.0 - 0.1 K/uL Final   Immature Granulocytes 11/09/2021 1  % Final   Abs Immature Granulocytes 11/09/2021 0.11 (H)  0.00 - 0.07 K/uL Final   Performed at [facility], [place] [address], [place], [place] [address]   Interpretation 11/09/2021 Note   Final   Comment: (NOTE) ------------------------------- COAGULATION: VON WILLEBRAND FACTOR ASSESSMENT CURRENT RESULTS ASSESSMENT The VWF:Ag is normal. The VWF:RCo is normal. The FVIII is elevated. VON WILLEBRAND FACTOR ASSESSMENT CURRENT RESULTS INTERPRETATION - These results are not consistent with a diagnosis of VWD according to the current NHLBI guideline. Persistently elevated FVIII activity is a risk factor for venous thrombosis as well as  recurrence of venous thrombosis. Risk is graded and increases with the degree of elevation. Although elevated FVIII activity has been identified to cluster within families, a genetic basis for the elevation has not yet been elucidated (Br J Haematol. 2012; 157(6):653-663). VON WILLEBRAND FACTOR ASSESSMENT - Results may be falsely elevated and possibly falsely normal as VWF and FVIII may increase in samples drawn from patients (particularly children) who are visibly stressed at the time of phlebotomy, as acute phase reactants, or in respons                          e to certain drug therapies such as desmopressin. Repeat testing may be necessary before excluding a diagnosis of VWD especially if the clinical suspicion is high for an underlying bleeding disorder. The setting for phlebotomy should be as calm as possible and patients should be encouraged to sit quietly prior to the blood draw. VON WILLEBRAND FACTOR ASSESSMENT DEFINITIONS - VWD - von Willebrand disease; VWF - von Willebrand factor; VWF:Ag -  VWF antigen; VWF:RCo - VWF ristocetin cofactor activity; FVIII - factor VIII activity. MEDICAL DIRECTOR: For questions regarding panel interpretation, please contact Jake Bathe, M.D. at [facility] at [phone]. ------------------------------- DISCLAIMER These assessments and interpretations are provided as a convenience in support of the physician-patient relationship and are not intended to replace the physician's clinical judgment. They are derived from national guidelines in addition to other evide                          nce and expert opinion. The clinician should consider this information within the context of clinical opinion and the individual patient. SEE GUIDANCE FOR VON WILLEBRAND FACTOR ASSESSMENT: (1) The National Heart, Lung and Blood Institute. The Diagnosis, Evaluation and Management of von Willebrand Disease. Janeal Holmes, MD: [place] Publication 21-1941. 7408. Available at vSpecials.com.pt. (2) Daryl Eastern et al. [place] J Hematol. 2009; 84(6):366-370. (3) [place]. 2004;10(3):199-217. (4) Pasi KJ et al. Haemophilia. 2004; 10(3):218-231. Performed At: [facility] [address] [place], [place] [id number] Eino Farber MD JS:[id number]     RADIOGRAPHIC STUDIES: I have personally reviewed the radiological images as listed and agree with the findings in the report  No results found.  ASSESSMENT/PLAN  Patient is a year old  76 female with symptomatic microcytic anemia presumed to be secondary to chronic blood loss.    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  Referred to GI for consideration of endoscopy given patient's age  Therapeutics:  Since patient has symptomatic anemia with Hgb < 10  and has not tolerated/been compliant with oral iron will arrange for IV iron replacement.   Given the severe symptoms we prefer to  replete iron stores in one or two visits rather than over the course of several months.  In addition ongoing blood loss exceeds the capacity of oral iron to meet needs.  A discussion regarding risks was had with the patient.  IV iron has the potential to cause allergic reactions, including potentially life-threatening anaphylaxis.   IV iron may be associated with non-allergic infusion reactions including self-limiting urticaria, palpitations, dizziness, and neck and back spasm; generally, these occur in <1 percent of individuals and do not progress to more serious reactions. The non-allergic reaction consisting of flushing of the face and myalgias of the chest and back.   After discussion of the  risks and benefits of IV iron therapy patient has elected to proceed with parenteral iron therapy.    Dysfunctional uterine bleeding:  Has been seen by Gynecology for this.  Exacerbated by antiplatelet effects of plavix and lexapro.  No evidence of coagulopathy as judged by screening labs    Cancer Staging  No matching staging information was found for the patient.   No problem-specific Assessment & Plan notes found for this encounter.   No orders of the defined types were placed in this encounter.   All questions were answered. The patient knows to call the clinic with any problems, questions or concerns.  This note was electronically signed.    Barbee Cough, MD  12/17/2021 12:49 PM

## 2021-12-18 ENCOUNTER — Other Ambulatory Visit: Payer: Self-pay

## 2021-12-18 ENCOUNTER — Inpatient Hospital Stay: Payer: Managed Care, Other (non HMO) | Attending: Oncology | Admitting: Oncology

## 2021-12-18 ENCOUNTER — Inpatient Hospital Stay: Payer: Managed Care, Other (non HMO)

## 2021-12-18 VITALS — BP 127/70 | HR 74 | Temp 98.2°F | Resp 15 | Wt 170.9 lb

## 2021-12-18 DIAGNOSIS — Z79899 Other long term (current) drug therapy: Secondary | ICD-10-CM | POA: Insufficient documentation

## 2021-12-18 DIAGNOSIS — D500 Iron deficiency anemia secondary to blood loss (chronic): Secondary | ICD-10-CM

## 2021-12-18 DIAGNOSIS — F17210 Nicotine dependence, cigarettes, uncomplicated: Secondary | ICD-10-CM | POA: Insufficient documentation

## 2021-12-18 DIAGNOSIS — D509 Iron deficiency anemia, unspecified: Secondary | ICD-10-CM | POA: Insufficient documentation

## 2021-12-18 DIAGNOSIS — Z7902 Long term (current) use of antithrombotics/antiplatelets: Secondary | ICD-10-CM

## 2021-12-18 DIAGNOSIS — I739 Peripheral vascular disease, unspecified: Secondary | ICD-10-CM | POA: Diagnosis not present

## 2021-12-18 DIAGNOSIS — N938 Other specified abnormal uterine and vaginal bleeding: Secondary | ICD-10-CM | POA: Insufficient documentation

## 2021-12-18 LAB — PROTIME-INR
INR: 1 (ref 0.8–1.2)
Prothrombin Time: 13.4 seconds (ref 11.4–15.2)

## 2021-12-18 LAB — CBC WITH DIFFERENTIAL (CANCER CENTER ONLY)
Abs Immature Granulocytes: 0.02 10*3/uL (ref 0.00–0.07)
Basophils Absolute: 0.1 10*3/uL (ref 0.0–0.1)
Basophils Relative: 2 %
Eosinophils Absolute: 0.2 10*3/uL (ref 0.0–0.5)
Eosinophils Relative: 2 %
HCT: 30.2 % — ABNORMAL LOW (ref 36.0–46.0)
Hemoglobin: 9.6 g/dL — ABNORMAL LOW (ref 12.0–15.0)
Immature Granulocytes: 0 %
Lymphocytes Relative: 36 %
Lymphs Abs: 3.5 10*3/uL (ref 0.7–4.0)
MCH: 25.2 pg — ABNORMAL LOW (ref 26.0–34.0)
MCHC: 31.8 g/dL (ref 30.0–36.0)
MCV: 79.3 fL — ABNORMAL LOW (ref 80.0–100.0)
Monocytes Absolute: 0.8 10*3/uL (ref 0.1–1.0)
Monocytes Relative: 8 %
Neutro Abs: 5 10*3/uL (ref 1.7–7.7)
Neutrophils Relative %: 52 %
Platelet Count: 351 10*3/uL (ref 150–400)
RBC: 3.81 MIL/uL — ABNORMAL LOW (ref 3.87–5.11)
RDW: 16.5 % — ABNORMAL HIGH (ref 11.5–15.5)
WBC Count: 9.6 10*3/uL (ref 4.0–10.5)
nRBC: 0 % (ref 0.0–0.2)

## 2021-12-18 LAB — APTT: aPTT: 29 seconds (ref 24–36)

## 2021-12-18 LAB — FERRITIN: Ferritin: 4 ng/mL — ABNORMAL LOW (ref 11–307)

## 2021-12-18 NOTE — Progress Notes (Signed)
This nurse made patient aware of iron infusion scheduled for Tuesday 10/31 at 3 pm.  Also made aware that schedulers will reach out to her to schedule a second Iron infusion and follow up appointment in 6 weeks.  Patient acknowledged understanding.  No other questions or concerns noted at this time.

## 2021-12-21 ENCOUNTER — Telehealth: Payer: Self-pay | Admitting: Oncology

## 2021-12-21 NOTE — Telephone Encounter (Signed)
Scheduled appts per 10/27 staff msg. Pt is aware.

## 2021-12-22 ENCOUNTER — Inpatient Hospital Stay: Payer: Managed Care, Other (non HMO)

## 2021-12-29 ENCOUNTER — Ambulatory Visit: Payer: Managed Care, Other (non HMO)

## 2021-12-29 ENCOUNTER — Other Ambulatory Visit: Payer: Self-pay

## 2021-12-29 ENCOUNTER — Inpatient Hospital Stay: Payer: Managed Care, Other (non HMO) | Attending: Oncology

## 2021-12-29 VITALS — BP 142/92 | HR 66 | Temp 98.0°F | Resp 17

## 2021-12-29 DIAGNOSIS — D500 Iron deficiency anemia secondary to blood loss (chronic): Secondary | ICD-10-CM

## 2021-12-29 DIAGNOSIS — D509 Iron deficiency anemia, unspecified: Secondary | ICD-10-CM | POA: Diagnosis not present

## 2021-12-29 DIAGNOSIS — N938 Other specified abnormal uterine and vaginal bleeding: Secondary | ICD-10-CM | POA: Diagnosis present

## 2021-12-29 MED ORDER — SODIUM CHLORIDE 0.9 % IV SOLN: Freq: Once | INTRAVENOUS | Status: AC

## 2021-12-29 MED ORDER — ACETAMINOPHEN 325 MG PO TABS
650.0000 mg | ORAL_TABLET | Freq: Once | ORAL | Status: AC
  Administered 2021-12-29: 650 mg via ORAL
  Filled 2021-12-29: qty 2

## 2021-12-29 MED ORDER — SODIUM CHLORIDE 0.9 % IV SOLN
400.0000 mg | Freq: Once | INTRAVENOUS | Status: AC
  Administered 2021-12-29: 400 mg via INTRAVENOUS
  Filled 2021-12-29: qty 20

## 2021-12-29 MED ORDER — LORATADINE 10 MG PO TABS
10.0000 mg | ORAL_TABLET | Freq: Every day | ORAL | Status: DC
  Administered 2021-12-29: 10 mg via ORAL
  Filled 2021-12-29: qty 1

## 2021-12-29 NOTE — Patient Instructions (Signed)

## 2021-12-31 ENCOUNTER — Encounter: Payer: Self-pay | Admitting: Oncology

## 2022-01-05 ENCOUNTER — Inpatient Hospital Stay: Payer: Managed Care, Other (non HMO)

## 2022-01-05 ENCOUNTER — Other Ambulatory Visit: Payer: Self-pay

## 2022-01-05 VITALS — BP 112/63 | HR 63 | Temp 98.4°F | Resp 18

## 2022-01-05 DIAGNOSIS — D500 Iron deficiency anemia secondary to blood loss (chronic): Secondary | ICD-10-CM

## 2022-01-05 DIAGNOSIS — D509 Iron deficiency anemia, unspecified: Secondary | ICD-10-CM | POA: Diagnosis not present

## 2022-01-05 MED ORDER — ACETAMINOPHEN 325 MG PO TABS
650.0000 mg | ORAL_TABLET | Freq: Once | ORAL | Status: AC
  Administered 2022-01-05: 650 mg via ORAL
  Filled 2022-01-05: qty 2

## 2022-01-05 MED ORDER — SODIUM CHLORIDE 0.9 % IV SOLN
400.0000 mg | Freq: Once | INTRAVENOUS | Status: AC
  Administered 2022-01-05: 400 mg via INTRAVENOUS
  Filled 2022-01-05: qty 20

## 2022-01-05 MED ORDER — SODIUM CHLORIDE 0.9 % IV SOLN: Freq: Once | INTRAVENOUS | Status: AC

## 2022-01-05 MED ORDER — LORATADINE 10 MG PO TABS
10.0000 mg | ORAL_TABLET | Freq: Every day | ORAL | Status: DC
  Administered 2022-01-05: 10 mg via ORAL
  Filled 2022-01-05: qty 1

## 2022-01-05 NOTE — Patient Instructions (Signed)

## 2022-01-17 ENCOUNTER — Other Ambulatory Visit: Payer: Self-pay | Admitting: Family Medicine

## 2022-01-29 ENCOUNTER — Other Ambulatory Visit: Payer: Self-pay

## 2022-01-29 ENCOUNTER — Inpatient Hospital Stay: Payer: Managed Care, Other (non HMO)

## 2022-01-29 ENCOUNTER — Inpatient Hospital Stay: Payer: Managed Care, Other (non HMO) | Attending: Oncology | Admitting: Oncology

## 2022-01-29 VITALS — BP 136/67 | HR 67 | Temp 98.3°F | Resp 15 | Wt 164.7 lb

## 2022-01-29 DIAGNOSIS — Z7189 Other specified counseling: Secondary | ICD-10-CM | POA: Diagnosis not present

## 2022-01-29 DIAGNOSIS — F17210 Nicotine dependence, cigarettes, uncomplicated: Secondary | ICD-10-CM | POA: Insufficient documentation

## 2022-01-29 DIAGNOSIS — D500 Iron deficiency anemia secondary to blood loss (chronic): Secondary | ICD-10-CM | POA: Diagnosis not present

## 2022-01-29 DIAGNOSIS — Z720 Tobacco use: Secondary | ICD-10-CM | POA: Diagnosis not present

## 2022-01-29 DIAGNOSIS — N938 Other specified abnormal uterine and vaginal bleeding: Secondary | ICD-10-CM | POA: Diagnosis present

## 2022-01-29 DIAGNOSIS — Z79899 Other long term (current) drug therapy: Secondary | ICD-10-CM | POA: Diagnosis not present

## 2022-01-29 DIAGNOSIS — D509 Iron deficiency anemia, unspecified: Secondary | ICD-10-CM | POA: Diagnosis present

## 2022-01-29 LAB — CBC WITH DIFFERENTIAL (CANCER CENTER ONLY)
Abs Immature Granulocytes: 0.02 10*3/uL (ref 0.00–0.07)
Basophils Absolute: 0.1 10*3/uL (ref 0.0–0.1)
Basophils Relative: 1 %
Eosinophils Absolute: 0.3 10*3/uL (ref 0.0–0.5)
Eosinophils Relative: 3 %
HCT: 38.2 % (ref 36.0–46.0)
Hemoglobin: 12.3 g/dL (ref 12.0–15.0)
Immature Granulocytes: 0 %
Lymphocytes Relative: 34 %
Lymphs Abs: 3.3 10*3/uL (ref 0.7–4.0)
MCH: 27.3 pg (ref 26.0–34.0)
MCHC: 32.2 g/dL (ref 30.0–36.0)
MCV: 84.7 fL (ref 80.0–100.0)
Monocytes Absolute: 0.6 10*3/uL (ref 0.1–1.0)
Monocytes Relative: 6 %
Neutro Abs: 5.4 10*3/uL (ref 1.7–7.7)
Neutrophils Relative %: 56 %
Platelet Count: 226 10*3/uL (ref 150–400)
RBC: 4.51 MIL/uL (ref 3.87–5.11)
RDW: 20.9 % — ABNORMAL HIGH (ref 11.5–15.5)
WBC Count: 9.6 10*3/uL (ref 4.0–10.5)
nRBC: 0 % (ref 0.0–0.2)

## 2022-01-29 LAB — APTT: aPTT: 28 seconds (ref 24–36)

## 2022-01-29 LAB — PROTIME-INR
INR: 1.1 (ref 0.8–1.2)
Prothrombin Time: 13.9 seconds (ref 11.4–15.2)

## 2022-01-29 LAB — FERRITIN: Ferritin: 56 ng/mL (ref 11–307)

## 2022-01-29 NOTE — Patient Instructions (Signed)
Please start taking a Flintstone's vitamin with iron daily Your blood work looked great

## 2022-01-29 NOTE — Progress Notes (Signed)
[facility] Follow up Visit:  Patient Care Team: Susy Frizzle, MD as PCP - General (Family Medicine)  CHIEF COMPLAINTS/PURPOSE OF CONSULTATION:  Evaluation of anemia  Oncology History   No history exists.    HISTORY OF PRESENTING ILLNESS: Cassie Lynn 51 y.o. female is here because of  anemia Medical history notable for brain aneurysm, Buerger's disease, peripheral vascular disease, fibromyalgia August 06, 2021: CMP notable for glucose 126 October 13, 2021: WBC 11.2 hemoglobin 9.3 MCV 78 platelet count 290; 55 segs 35 lymphs 6 monos 3 eos 1 basophil.  Ferritin 6  November 06 2021:  [facility] Hematology Consult  She states that she has been anemic for quite awhile  Patient is G6  P3033 last pregnancy was 26 yrs ago.  Menopause not reached.  In the past menses occurred regularly but in the last few months have been intermittent.  Bleeding lasts 3 to 7 days and is heavy with passages of clots.  Last year underwent a D&C to manage heavy bleeding.  Last pregnancy was delivered by NSVD.  Has taken oral iron which she can tolerate but states that it her Hgb just doesn't go up.  Has never received IV iron nor required PRBC's in the past.  Has a regular diet.  No history/ history of postpartum hemorrhage requiring transfusion.   No history hemorrhage postoperatively.  No hematochezia, melena, hemoptysis, hematuria.  No history of intra-articular or soft tissue bleeding No history of abnormal bleeding in family members Patient has symptoms of fatigue, pallor,  DOE, decreased performance status.  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    Underwent EGD in the past but has not had a colonoscopy. Has tried OCP's but states that did not help.    Social:  Began smoking at 43 yoa, smokes 1/2 ppd now but used to smoke 2 1/2 ppd.  EtOH occasionally  [facility] Mother died 68 mesothelioma worked in [employer] Father alive 61 well Sister alive 82 bipolar disorder, cerebral  palsy  WBC 12.0 hemoglobin 10.2 MCV 79 platelet count 351; 64 segs 29 lymphs 4 monos 1 EO.  Reticulocytes 1.2%  ferritin 5 B12 257 folate 9.1 Haptoglobin 245 Coombs test negative Fibrinogen 346 Factor VIII 153 von Willebrand factor antigen 124 activity 82   December 18 2021:  Scheduled follow up for management of anemia.  Reviewed results of labs with patient.  Has not yet received IV iron  December 29, 2021 Venofer 400 mg January 05, 2022 Venofer 400 mg  January 29 2022:  Scheduled follow up for management of anemia  Feels better since receiving IV iron.  Has not yet been seen by GI.  Smoking less than 1/2 ppd cigarettes a day.  Applauded efforts at smoking cessation.  Encouraged further steps toward abstaining.  Patient to start Flintsone's MVI with iron daily.   Reviewed labs.  Hemoglobin 12.3 today At next visit will discuss CT lung cancer screening  Review of Systems - Oncology  MEDICAL HISTORY: Past Medical History:  Diagnosis Date   Anemia    Aneurysm ([place])    brain- 2015ish was gone   Buerger's disease ([place])    pvd with Raynaud's phenomenon   Fibromyalgia    Migraine    Migraines.  Takes Depakote   Shortness of breath dyspnea     SURGICAL HISTORY: Past Surgical History:  Procedure Laterality Date   CESAREAN SECTION     CHOLECYSTECTOMY     IR RADIOLOGY PERIPHERAL GUIDED  IV START  10/13/2017   IR US GUIDE VASC ACCESS RIGHT  10/13/2017   LUMBAR LAMINECTOMY/DECOMPRESSION MICRODISCECTOMY Left 04/18/2015   Procedure: Left Lumbar five sacral-one  Microdiskectomy;  Surgeon: Kevan Ny Ditty, MD;  Location: [facility];  Service: Neurosurgery;  Laterality: Left;    SOCIAL HISTORY: Social History   Socioeconomic History   Marital status: Single    Spouse name: Not on file   Number of children: Not on file   Years of education: Not on file   Highest education level: Not on file  Occupational History   Not on file  Tobacco Use   Smoking status: Every Day     Years: 28.00    Types: Cigarettes   Smokeless tobacco: Never   Tobacco comments:    1 pack lasts 3 days.  Vaping Use   Vaping Use: Never used  Substance and Sexual Activity   Alcohol use: Yes    Comment: occ    Drug use: No   Sexual activity: Not on file  Other Topics Concern   Not on file  Social History Narrative   Leave with boyfriend   1-story home 2 steps at the front   1-cup a day   Social Determinants of Health   Financial Resource Strain: Not on file  Food Insecurity: Not on file  Transportation Needs: Not on file  Physical Activity: Not on file  Stress: Not on file  Social Connections: Not on file  Intimate Partner Violence: Not on file    FAMILY HISTORY Family History  Problem Relation Age of Onset   Hypertension Mother    Hypertension Father    Hypertension Sister     ALLERGIES:  is allergic to vicodin [hydrocodone-acetaminophen].  MEDICATIONS:  Current Outpatient Medications  Medication Sig Dispense Refill   ALPRAZolam (XANAX) 0.5 MG tablet Take 1 tablet (0.5 mg total) by mouth 3 (three) times daily as needed. 30 tablet 0   amLODipine (NORVASC) 10 MG tablet Take by mouth.     clopidogrel (PLAVIX) 75 MG tablet TAKE 1 TABLET BY MOUTH EVERY DAY 90 tablet 3   cyclobenzaprine (FLEXERIL) 5 MG tablet Take by mouth.     escitalopram (LEXAPRO) 10 MG tablet TAKE 1 TABLET BY MOUTH EVERY DAY 90 tablet 2   gabapentin (NEURONTIN) 300 MG capsule TAKE 1 CAPSULE BY MOUTH THREE TIMES A DAY 270 capsule 0   losartan (COZAAR) 100 MG tablet TAKE 1/2 TABLET BY MOUTH EVERY DAY 90 tablet 1   metoprolol tartrate (LOPRESSOR) 50 MG tablet Take 25 mg by mouth daily.     rosuvastatin (CRESTOR) 20 MG tablet Take 1 tablet (20 mg total) by mouth daily. 90 tablet 1   SUMAtriptan (IMITREX) 100 MG tablet sumatriptan 100 mg tablet     topiramate (TOPAMAX) 50 MG tablet TAKE 1 TABLET BY MOUTH DAILY AND 2 TABLETS EVERY NIGHT 90 tablet 2   No current facility-administered medications for  this visit.    PHYSICAL EXAMINATION:  ECOG PERFORMANCE STATUS: 1 - Symptomatic but completely ambulatory   There were no vitals filed for this visit.   There were no vitals filed for this visit.    Physical Exam Vitals and nursing note reviewed.  Constitutional:      Appearance: Normal appearance. She is not toxic-appearing or diaphoretic.     Comments: Here alone  HENT:     Head: Normocephalic and atraumatic.     Right Ear: External ear normal.     Left Ear: External ear  normal.     Nose: Nose normal. No congestion or rhinorrhea.  Eyes:     General: No scleral icterus.    Extraocular Movements: Extraocular movements intact.     Conjunctiva/sclera: Conjunctivae normal.     Pupils: Pupils are equal, round, and reactive to light.  Cardiovascular:     Rate and Rhythm: Normal rate and regular rhythm.     Pulses: Normal pulses.     Heart sounds: Normal heart sounds. No murmur heard.    No friction rub. No gallop.  Pulmonary:     Effort: Pulmonary effort is normal. No respiratory distress.     Breath sounds: Normal breath sounds. No stridor. No wheezing, rhonchi or rales.  Abdominal:     General: Bowel sounds are normal. There is no distension.     Palpations: Abdomen is soft. There is no mass.     Tenderness: There is no abdominal tenderness. There is no guarding or rebound.     Hernia: No hernia is present.  Musculoskeletal:        General: No swelling, tenderness or deformity.     Cervical back: Normal range of motion and neck supple. No rigidity or tenderness.  Lymphadenopathy:     Head:     Right side of head: No submental, submandibular, tonsillar, preauricular, posterior auricular or occipital adenopathy.     Left side of head: No submental, submandibular, tonsillar, preauricular, posterior auricular or occipital adenopathy.     Cervical: No cervical adenopathy.     Right cervical: No superficial, deep or posterior cervical adenopathy.    Left cervical: No  superficial, deep or posterior cervical adenopathy.     Upper Body:     Right upper body: No supraclavicular, axillary, pectoral or epitrochlear adenopathy.     Left upper body: No supraclavicular, axillary, pectoral or epitrochlear adenopathy.  Skin:    General: Skin is warm.     Coloration: Skin is pale. Skin is not jaundiced.     Findings: No bruising, erythema, lesion or rash.  Neurological:     General: No focal deficit present.     Mental Status: She is alert and oriented to person, place, and time. Mental status is at baseline.     Cranial Nerves: No cranial nerve deficit.     Sensory: No sensory deficit.     Motor: No weakness.  Psychiatric:        Mood and Affect: Mood normal.        Behavior: Behavior normal.        Thought Content: Thought content normal.        Judgment: Judgment normal.     LABORATORY DATA: I have personally reviewed the data as listed:  No visits with results within 1 Month(s) from this visit.  Latest known visit with results is:  Appointment on 12/18/2021  Component Date Value Ref Range Status   aPTT 12/18/2021 29  24 - 36 seconds Final   Performed at [facility], [place] [address] [place], [place] [address]   Ferritin 12/18/2021 4 (L)  11 - 307 ng/mL Final   Performed at [place] [address] [place], [place] [address]   WBC Count 12/18/2021 9.6  4.0 - 10.5 K/uL Final   RBC 12/18/2021 3.81 (L)  3.87 - 5.11 MIL/uL Final   Hemoglobin 12/18/2021 9.6 (L)  12.0 - 15.0 g/dL Final   HCT 12/18/2021 30.2 (L)  36.0 - 46.0 % Final   MCV 12/18/2021 79.3 (  L)  80.0 - 100.0 fL Final   MCH 12/18/2021 25.2 (L)  26.0 - 34.0 pg Final   MCHC 12/18/2021 31.8  30.0 - 36.0 g/dL Final   RDW 12/18/2021 16.5 (H)  11.5 - 15.5 % Final   Platelet Count 12/18/2021 351  150 - 400 K/uL Final   nRBC 12/18/2021 0.0  0.0 - 0.2 % Final   Neutrophils Relative % 12/18/2021 52  % Final   Neutro Abs 12/18/2021 5.0  1.7 - 7.7 K/uL Final    Lymphocytes Relative 12/18/2021 36  % Final   Lymphs Abs 12/18/2021 3.5  0.7 - 4.0 K/uL Final   Monocytes Relative 12/18/2021 8  % Final   Monocytes Absolute 12/18/2021 0.8  0.1 - 1.0 K/uL Final   Eosinophils Relative 12/18/2021 2  % Final   Eosinophils Absolute 12/18/2021 0.2  0.0 - 0.5 K/uL Final   Basophils Relative 12/18/2021 2  % Final   Basophils Absolute 12/18/2021 0.1  0.0 - 0.1 K/uL Final   Immature Granulocytes 12/18/2021 0  % Final   Abs Immature Granulocytes 12/18/2021 0.02  0.00 - 0.07 K/uL Final   Performed at [facility], [place] [address] [place], [place] [address]   Prothrombin Time 12/18/2021 13.4  11.4 - 15.2 seconds Final   INR 12/18/2021 1.0  0.8 - 1.2 Final   Comment: (NOTE) INR goal varies based on device and disease states. Performed at [facility], [place] [address], [place], [place] [address]     RADIOGRAPHIC STUDIES: I have personally reviewed the radiological images as listed and agree with the findings in the report  No results found.  ASSESSMENT/PLAN  Patient is a year old  44 female with symptomatic microcytic anemia presumed to be secondary to chronic blood loss.    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  Referred to GI for consideration of endoscopy given patient's age  Therapeutics:  Since patient had symptomatic anemia with Hgb < 10  and had not tolerated/been compliant with oral iron received IV iron replacement with improvement of symptoms. Encouraged her to begin Flintsone's MVI with iron daily.    Dysfunctional uterine bleeding:  Has been seen by Gynecology for this.  Exacerbated by antiplatelet effects of plavix and lexapro.  No evidence of coagulopathy as judged by screening labs   Tobacco use:  Discussed strategies for smoking cessation.  At next visit will discuss CT lung cancer screening   Cancer Staging  No matching staging information was  found for the patient.   No problem-specific Assessment & Plan notes found for this encounter.   No orders of the defined types were placed in this encounter.   All questions were answered. The patient knows to call the clinic with any problems, questions or concerns.  This note was electronically signed.    Barbee Cough, MD  01/29/2022 1:11 PM

## 2022-02-01 ENCOUNTER — Telehealth: Payer: Self-pay | Admitting: Oncology

## 2022-02-01 NOTE — Telephone Encounter (Signed)
Left patient a voicemail regarding upcoming appointments  

## 2022-02-03 ENCOUNTER — Encounter: Payer: Self-pay | Admitting: Internal Medicine

## 2022-03-11 ENCOUNTER — Telehealth: Payer: Self-pay

## 2022-03-11 ENCOUNTER — Encounter: Payer: Self-pay | Admitting: Internal Medicine

## 2022-03-11 ENCOUNTER — Ambulatory Visit: Payer: Managed Care, Other (non HMO) | Admitting: Internal Medicine

## 2022-03-11 VITALS — BP 130/68 | HR 80 | Ht 65.0 in | Wt 166.4 lb

## 2022-03-11 DIAGNOSIS — R634 Abnormal weight loss: Secondary | ICD-10-CM | POA: Diagnosis not present

## 2022-03-11 DIAGNOSIS — D509 Iron deficiency anemia, unspecified: Secondary | ICD-10-CM | POA: Diagnosis not present

## 2022-03-11 MED ORDER — NA SULFATE-K SULFATE-MG SULF 17.5-3.13-1.6 GM/177ML PO SOLN: 1.0000 | ORAL | 0 refills | Status: DC

## 2022-03-11 NOTE — Patient Instructions (Addendum)
If you are age 52 or younger, your body mass index should be between 19-25. Your Body mass index is 27.69 kg/m. If this is out of the aformentioned range listed, please consider follow up with your Primary Care Provider.  ________________________________________________________  The [facility] providers would like to encourage you to use [facility] to communicate with providers for non-urgent requests or questions.  Due to long hold times on the telephone, sending your provider a message by [facility] may be a faster and more efficient way to get a response.  Please allow 48 business hours for a response.  Please remember that this is for non-urgent requests.  _______________________________________________________  Dennis Bast have been scheduled for an endoscopy and colonoscopy. Please follow the written instructions given to you at your visit today. Please pick up your prep supplies at the pharmacy within the next 1-3 days. If you use inhalers (even only as needed), please bring them with you on the day of your procedure.  You will be contacted by our office prior to your procedure for directions on holding your Plavix.  If you do not hear from our office 1 week prior to your scheduled procedure, please call [phone] to discuss.   Due to recent changes in healthcare laws, you may see the results of your imaging and laboratory studies on MyChart before your provider has had a chance to review them.  We understand that in some cases there may be results that are confusing or concerning to you. Not all laboratory results come back in the same time frame and the provider may be waiting for multiple results in order to interpret others.  Please give [place] 48 hours in order for your provider to thoroughly review all the results before contacting the office for clarification of your results.   Thank you for entrusting me with your care and choosing [facility].  Dr Lorenso Courier

## 2022-03-11 NOTE — Progress Notes (Signed)
Chief Complaint: IDA  HPI : 52 year old female with history of Buerger's disease, migraines, and fibromyalgia presents with IDA  She has been following with hematology for IDA that was treated with IV iron infusions. Denies hematochezia or melena. Endorses menstrual periods that can be irregular. Her most recent menstrual period has lasted for almost a month. Denies changes in bowel habits. Endorses some unintentional weight loss 20 lbs over the last several months. Endorses nausea that started recently. Denies vomiting. Denies dysphagia, odynophagia, chest burning, and regurgitation. She had some stomach pain in the LLQ for a few days about a week ago, which went away on its own. Denies family history of GI issues. Denies use of blood thinners. For headaches, she will use Aleve or Goody's occasionally. She drinks alcohol only occasionally. She does smoke but has been trying to cut down. Denies prior EGD or colonoscopy.  Wt Readings from Last 3 Encounters:  03/11/22 166 lb 6 oz (75.5 kg)  01/29/22 164 lb 11.2 oz (74.7 kg)  12/18/21 170 lb 14.4 oz (77.5 kg)   Past Medical History:  Diagnosis Date   Anemia    Aneurysm ([place])    brain- 2015ish was gone   Buerger's disease ([place])    pvd with Raynaud's phenomenon   Fibromyalgia    Migraine    Migraines.  Takes Depakote   Shortness of breath dyspnea    Past Surgical History:  Procedure Laterality Date   CESAREAN SECTION     CHOLECYSTECTOMY     IR RADIOLOGY PERIPHERAL GUIDED IV START  10/13/2017   IR US GUIDE VASC ACCESS RIGHT  10/13/2017   LUMBAR LAMINECTOMY/DECOMPRESSION MICRODISCECTOMY Left 04/18/2015   Procedure: Left Lumbar five sacral-one  Microdiskectomy;  Surgeon: Kevan Ny Ditty, MD;  Location: [facility];  Service: Neurosurgery;  Laterality: Left;   Family History  Problem Relation Age of Onset   Hypertension Mother    Lung cancer Mother    Hypertension Father    Hypertension Sister    Bipolar disorder Sister     Cerebral palsy Sister    Social History   Tobacco Use   Smoking status: Every Day    Years: 28.00    Types: Cigarettes   Smokeless tobacco: Never   Tobacco comments:    1 pack lasts 3 days.  Vaping Use   Vaping Use: Never used  Substance Use Topics   Alcohol use: Yes    Comment: occ    Drug use: No   Current Outpatient Medications  Medication Sig Dispense Refill   ALPRAZolam (XANAX) 0.5 MG tablet Take 1 tablet (0.5 mg total) by mouth 3 (three) times daily as needed. 30 tablet 0   amLODipine (NORVASC) 10 MG tablet Take by mouth.     clopidogrel (PLAVIX) 75 MG tablet TAKE 1 TABLET BY MOUTH EVERY DAY 90 tablet 3   cyclobenzaprine (FLEXERIL) 5 MG tablet Take by mouth.     escitalopram (LEXAPRO) 10 MG tablet TAKE 1 TABLET BY MOUTH EVERY DAY 90 tablet 2   gabapentin (NEURONTIN) 300 MG capsule TAKE 1 CAPSULE BY MOUTH THREE TIMES A DAY 270 capsule 0   losartan (COZAAR) 100 MG tablet TAKE 1/2 TABLET BY MOUTH EVERY DAY 90 tablet 1   metoprolol tartrate (LOPRESSOR) 50 MG tablet Take 25 mg by mouth daily.     rosuvastatin (CRESTOR) 20 MG tablet Take 1 tablet (20 mg total) by mouth daily. 90 tablet 1   SUMAtriptan (IMITREX) 100 MG tablet sumatriptan 100 mg  tablet     topiramate (TOPAMAX) 50 MG tablet TAKE 1 TABLET BY MOUTH DAILY AND 2 TABLETS EVERY NIGHT 90 tablet 2   No current facility-administered medications for this visit.   Allergies  Allergen Reactions   Vicodin [Hydrocodone-Acetaminophen] Other (See Comments)    Makes her feel like she cannot breath, but no throat swelling. No issues with plain Tylenol   Review of Systems: All systems reviewed and negative except where noted in HPI.   Physical Exam: BP 130/68 (BP Location: Left Arm, Patient Position: Sitting, Cuff Size: Normal)   Pulse 80   Ht '5\' 5"'$  (1.651 m)   Wt 166 lb 6 oz (75.5 kg)   SpO2 96%   BMI 27.69 kg/m  Constitutional: Pleasant,well-developed, female in no acute distress. HEENT: Normocephalic and atraumatic.  Conjunctivae are normal. No scleral icterus. Cardiovascular: Normal rate, regular rhythm.  Pulmonary/chest: Effort normal and breath sounds normal. No wheezing, rales or rhonchi. Abdominal: Soft, nondistended, nontender. Bowel sounds active throughout. There are no masses palpable. No hepatomegaly. Extremities: No edema Neurological: Alert and oriented to person place and time. Skin: Skin is warm and dry. No rashes noted. Psychiatric: Normal mood and affect. Behavior is normal.  Labs 07/2021: CMP unremarkable.  Labs 11/2021: CBC with low Hb of 9.6. Low ferritin of 4.  Labs 01/2022: CBC with Hb of 12.3. Ferritin of 56.   ASSESSMENT AND PLAN: IDA Weight loss Patient presents with IDA and unintentional weight loss. Although her IDA may be due to her irregular menstrual periods, will plan for EGD and colonoscopy in order to rule out a GI etiology of her IDA, particularly in the setting of her weight loss. I went over the procedures in detail with the patient, and she is agreeable to proceeding. - EGD/colonoscopy [facility]  Christia Reading, MD  I spent 45 minutes of time, including in depth chart review, independent review of results as outlined above, communicating results with the patient directly, face-to-face time with the patient, coordinating care, ordering studies and medications as appropriate, and documentation.

## 2022-03-11 NOTE — Telephone Encounter (Signed)
[facility] Pre-operative Risk Assessment     Request for surgical clearance:     Endoscopy Procedure  What type of surgery is being performed?     Endoscopy and colonoscopy  When is this surgery scheduled?     05-14-22  What type of clearance is required ?   Pharmacy  Are there any medications that need to be held prior to surgery and how long? Plavix x 5 days   Practice name and name of physician performing surgery?      [facility]  What is your office phone and fax number?      Phone- [phone]  [phone]  Anesthesia type (None, local, MAC, general) ?       MAC

## 2022-03-12 NOTE — Telephone Encounter (Signed)
Left message for patient to return call to further discuss holding Plavix 5 days prior to endo/colon scheduled for 05-14-22.  Will continue efforts.

## 2022-03-15 NOTE — Telephone Encounter (Signed)
Left message for patient to return call to further discuss holding Plavix 5 days prior to endo/colon scheduled for 05-14-22.  Will continue efforts.

## 2022-03-16 NOTE — Telephone Encounter (Signed)
Patient returned call. Advised her that she should stop Plavix 5 days before procedure 05/14/22

## 2022-03-18 NOTE — Telephone Encounter (Signed)
Patient returned call; advised her of Plavix hold 5 days before scheduled procedure in March.

## 2022-04-23 ENCOUNTER — Encounter: Payer: Self-pay | Admitting: Oncology

## 2022-04-30 ENCOUNTER — Encounter: Payer: Self-pay | Admitting: Oncology

## 2022-04-30 ENCOUNTER — Inpatient Hospital Stay: Payer: Managed Care, Other (non HMO) | Attending: Oncology | Admitting: Oncology

## 2022-04-30 ENCOUNTER — Other Ambulatory Visit: Payer: Self-pay

## 2022-04-30 ENCOUNTER — Inpatient Hospital Stay: Payer: Managed Care, Other (non HMO) | Attending: Oncology

## 2022-04-30 ENCOUNTER — Other Ambulatory Visit: Payer: Self-pay | Admitting: *Deleted

## 2022-04-30 VITALS — BP 137/81 | HR 56 | Temp 98.0°F | Resp 18 | Wt 166.1 lb

## 2022-04-30 DIAGNOSIS — N938 Other specified abnormal uterine and vaginal bleeding: Secondary | ICD-10-CM | POA: Insufficient documentation

## 2022-04-30 DIAGNOSIS — I731 Thromboangiitis obliterans [Buerger's disease]: Secondary | ICD-10-CM | POA: Diagnosis not present

## 2022-04-30 DIAGNOSIS — D500 Iron deficiency anemia secondary to blood loss (chronic): Secondary | ICD-10-CM | POA: Diagnosis not present

## 2022-04-30 DIAGNOSIS — Z7902 Long term (current) use of antithrombotics/antiplatelets: Secondary | ICD-10-CM

## 2022-04-30 DIAGNOSIS — Z720 Tobacco use: Secondary | ICD-10-CM

## 2022-04-30 DIAGNOSIS — D509 Iron deficiency anemia, unspecified: Secondary | ICD-10-CM | POA: Insufficient documentation

## 2022-04-30 DIAGNOSIS — F17210 Nicotine dependence, cigarettes, uncomplicated: Secondary | ICD-10-CM | POA: Insufficient documentation

## 2022-04-30 LAB — CBC WITH DIFFERENTIAL (CANCER CENTER ONLY)
Abs Immature Granulocytes: 0.02 10*3/uL (ref 0.00–0.07)
Basophils Absolute: 0.1 10*3/uL (ref 0.0–0.1)
Basophils Relative: 1 %
Eosinophils Absolute: 0.3 10*3/uL (ref 0.0–0.5)
Eosinophils Relative: 3 %
HCT: 41.3 % (ref 36.0–46.0)
Hemoglobin: 13.6 g/dL (ref 12.0–15.0)
Immature Granulocytes: 0 %
Lymphocytes Relative: 34 %
Lymphs Abs: 3 10*3/uL (ref 0.7–4.0)
MCH: 30.6 pg (ref 26.0–34.0)
MCHC: 32.9 g/dL (ref 30.0–36.0)
MCV: 92.8 fL (ref 80.0–100.0)
Monocytes Absolute: 0.6 10*3/uL (ref 0.1–1.0)
Monocytes Relative: 7 %
Neutro Abs: 4.9 10*3/uL (ref 1.7–7.7)
Neutrophils Relative %: 55 %
Platelet Count: 256 10*3/uL (ref 150–400)
RBC: 4.45 MIL/uL (ref 3.87–5.11)
RDW: 14 % (ref 11.5–15.5)
WBC Count: 8.9 10*3/uL (ref 4.0–10.5)
nRBC: 0 % (ref 0.0–0.2)

## 2022-04-30 LAB — FERRITIN: Ferritin: 9 ng/mL — ABNORMAL LOW (ref 11–307)

## 2022-04-30 NOTE — Progress Notes (Signed)
[facility] Follow up Visit:  Patient Care Team: Susy Frizzle, MD as PCP - General (Family Medicine)  CHIEF COMPLAINTS/PURPOSE OF CONSULTATION:  Evaluation of anemia    HISTORY OF PRESENTING ILLNESS: Cassie Lynn 52 y.o. female is here because of  anemia Medical history notable for brain aneurysm, Buerger's disease, peripheral vascular disease, fibromyalgia August 06, 2021: CMP notable for glucose 126 October 13, 2021: WBC 11.2 hemoglobin 9.3 MCV 78 platelet count 290; 55 segs 35 lymphs 6 monos 3 eos 1 basophil.  Ferritin 6  November 06 2021:  [facility] Consult  She states that she has been anemic for quite awhile  Patient is G6  P3033 last pregnancy was 26 yrs ago.  Menopause not reached.  In the past menses occurred regularly but in the last few months have been intermittent.  Bleeding lasts 3 to 7 days and is heavy with passages of clots.  Last year underwent a D&C to manage heavy bleeding.  Last pregnancy was delivered by NSVD.  Has taken oral iron which she can tolerate but states that it her Hgb just doesn't go up.  Has never received IV iron nor required PRBC's in the past.  Has a regular diet.  No history/ history of postpartum hemorrhage requiring transfusion.   No history hemorrhage postoperatively.  No hematochezia, melena, hemoptysis, hematuria.  No history of intra-articular or soft tissue bleeding No history of abnormal bleeding in family members Patient has symptoms of fatigue, pallor,  DOE, decreased performance status.  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    Underwent EGD in the past but has not had a colonoscopy. Has tried OCP's but states that did not help.    Social:  Began smoking at 26 yoa, smokes 1/2 ppd now but used to smoke 2 1/2 ppd.  EtOH occasionally  [facility] Mother died 62 mesothelioma worked in [employer] Father alive 66 well Sister alive 85 bipolar disorder, cerebral palsy  WBC 12.0 hemoglobin 10.2 MCV 79  platelet count 351; 64 segs 29 lymphs 4 monos 1 EO.  Reticulocytes 1.2%  ferritin 5 B12 257 folate 9.1 Haptoglobin 245 Coombs test negative Fibrinogen 346 Factor VIII 153 von Willebrand factor antigen 124 activity 82   December 18 2021:  Scheduled follow up for management of anemia.  Reviewed results of labs with patient.  Has not yet received IV iron  December 29, 2021 Venofer 400 mg January 05, 2022 Venofer 400 mg  January 29 2022:  Scheduled follow up for management of anemia  Feels better since receiving IV iron.  Has not yet been seen by GI.  Smoking less than 1/2 ppd cigarettes a day.  Applauded efforts at smoking cessation.  Encouraged further steps toward abstaining.  Patient to start Flintsone's MVI with iron daily.   Reviewed labs.  Hemoglobin 12.3 today   March 11, 2022: GI consult.  Plan for EGD and colonoscopy to rule out GI etiology of iron deficiency anemia.  In setting of weight loss.  April 30, 2022: Scheduled follow-up for management of anemia.  Has not been able to make an appointment with her usual Gynecologist.  Will refer to a different Gynecologist.  Down to 4 cigarettes a day.  Taking Flintstone's MVI with iron daily and tolerating it well.  No weight change since last visit Hgb 13.6  May 14 2022:  For endoscopy  Review of Systems - Oncology  MEDICAL HISTORY: Past Medical History:  Diagnosis Date  Anemia    Aneurysm (HCC)    brain- 2015ish was gone   Buerger's disease ([place])    pvd with Raynaud's phenomenon   Fibromyalgia    Migraine    Migraines.  Takes Depakote   Shortness of breath dyspnea     SURGICAL HISTORY: Past Surgical History:  Procedure Laterality Date   CESAREAN SECTION     CHOLECYSTECTOMY     IR RADIOLOGY PERIPHERAL GUIDED IV START  10/13/2017   IR US GUIDE VASC ACCESS RIGHT  10/13/2017   LUMBAR LAMINECTOMY/DECOMPRESSION MICRODISCECTOMY Left 04/18/2015   Procedure: Left Lumbar five sacral-one  Microdiskectomy;  Surgeon: Kevan Ny Ditty, MD;  Location: [facility];  Service: Neurosurgery;  Laterality: Left;    SOCIAL HISTORY: Social History   Socioeconomic History   Marital status: Single    Spouse name: Not on file   Number of children: Not on file   Years of education: Not on file   Highest education level: Not on file  Occupational History   Not on file  Tobacco Use   Smoking status: Every Day    Years: 28.00    Types: Cigarettes   Smokeless tobacco: Never   Tobacco comments:    1 pack lasts 3 days.  Vaping Use   Vaping Use: Never used  Substance and Sexual Activity   Alcohol use: Yes    Comment: occ    Drug use: No   Sexual activity: Not on file  Other Topics Concern   Not on file  Social History Narrative   Leave with boyfriend   1-story home 2 steps at the front   1-cup a day   Social Determinants of Health   Financial Resource Strain: Not on file  Food Insecurity: Not on file  Transportation Needs: Not on file  Physical Activity: Not on file  Stress: Not on file  Social Connections: Not on file  Intimate Partner Violence: Not on file    FAMILY HISTORY Family History  Problem Relation Age of Onset   Hypertension Mother    Lung cancer Mother    Hypertension Father    Hypertension Sister    Bipolar disorder Sister    Cerebral palsy Sister     ALLERGIES:  is allergic to vicodin [hydrocodone-acetaminophen].  MEDICATIONS:  Current Outpatient Medications  Medication Sig Dispense Refill   ALPRAZolam (XANAX) 0.5 MG tablet Take 1 tablet (0.5 mg total) by mouth 3 (three) times daily as needed. 30 tablet 0   amLODipine (NORVASC) 10 MG tablet Take by mouth.     clopidogrel (PLAVIX) 75 MG tablet TAKE 1 TABLET BY MOUTH EVERY DAY 90 tablet 3   cyclobenzaprine (FLEXERIL) 5 MG tablet Take by mouth.     escitalopram (LEXAPRO) 10 MG tablet TAKE 1 TABLET BY MOUTH EVERY DAY 90 tablet 2   gabapentin (NEURONTIN) 300 MG capsule TAKE 1 CAPSULE BY MOUTH THREE TIMES A DAY 270 capsule 0    losartan (COZAAR) 100 MG tablet TAKE 1/2 TABLET BY MOUTH EVERY DAY 90 tablet 1   metoprolol tartrate (LOPRESSOR) 50 MG tablet Take 25 mg by mouth daily.     Na Sulfate-K Sulfate-Mg Sulf (SUPREP BOWEL PREP KIT) 17.5-3.13-1.6 GM/177ML SOLN Take 1 kit by mouth as directed. 324 mL 0   rosuvastatin (CRESTOR) 20 MG tablet Take 1 tablet (20 mg total) by mouth daily. 90 tablet 1   SUMAtriptan (IMITREX) 100 MG tablet sumatriptan 100 mg tablet     topiramate (TOPAMAX) 50 MG tablet TAKE 1  TABLET BY MOUTH DAILY AND 2 TABLETS EVERY NIGHT 90 tablet 2   No current facility-administered medications for this visit.    PHYSICAL EXAMINATION:  ECOG PERFORMANCE STATUS: 1 - Symptomatic but completely ambulatory   There were no vitals filed for this visit.   There were no vitals filed for this visit.    Physical Exam Vitals and nursing note reviewed.  Constitutional:      Appearance: Normal appearance. She is not toxic-appearing or diaphoretic.     Comments: Here alone  HENT:     Head: Normocephalic and atraumatic.     Right Ear: External ear normal.     Left Ear: External ear normal.     Nose: Nose normal. No congestion or rhinorrhea.  Eyes:     General: No scleral icterus.    Extraocular Movements: Extraocular movements intact.     Conjunctiva/sclera: Conjunctivae normal.     Pupils: Pupils are equal, round, and reactive to light.  Cardiovascular:     Rate and Rhythm: Normal rate and regular rhythm.     Pulses: Normal pulses.     Heart sounds: Normal heart sounds. No murmur heard.    No friction rub. No gallop.  Pulmonary:     Effort: Pulmonary effort is normal. No respiratory distress.     Breath sounds: Normal breath sounds. No stridor. No wheezing, rhonchi or rales.  Abdominal:     General: Bowel sounds are normal. There is no distension.     Palpations: Abdomen is soft. There is no mass.     Tenderness: There is no abdominal tenderness. There is no guarding or rebound.     Hernia: No  hernia is present.  Musculoskeletal:        General: No swelling, tenderness or deformity.     Cervical back: Normal range of motion and neck supple. No rigidity or tenderness.  Lymphadenopathy:     Head:     Right side of head: No submental, submandibular, tonsillar, preauricular, posterior auricular or occipital adenopathy.     Left side of head: No submental, submandibular, tonsillar, preauricular, posterior auricular or occipital adenopathy.     Cervical: No cervical adenopathy.     Right cervical: No superficial, deep or posterior cervical adenopathy.    Left cervical: No superficial, deep or posterior cervical adenopathy.     Upper Body:     Right upper body: No supraclavicular, axillary, pectoral or epitrochlear adenopathy.     Left upper body: No supraclavicular, axillary, pectoral or epitrochlear adenopathy.  Skin:    General: Skin is warm.     Coloration: Skin is pale. Skin is not jaundiced.     Findings: No bruising, erythema, lesion or rash.  Neurological:     General: No focal deficit present.     Mental Status: She is alert and oriented to person, place, and time. Mental status is at baseline.     Cranial Nerves: No cranial nerve deficit.     Sensory: No sensory deficit.     Motor: No weakness.  Psychiatric:        Mood and Affect: Mood normal.        Behavior: Behavior normal.        Thought Content: Thought content normal.        Judgment: Judgment normal.     LABORATORY DATA: I have personally reviewed the data as listed:  No visits with results within 1 Month(s) from this visit.  Latest known visit with results is:  Appointment on 01/29/2022  Component Date Value Ref Range Status   Prothrombin Time 01/29/2022 13.9  11.4 - 15.2 seconds Final   INR 01/29/2022 1.1  0.8 - 1.2 Final   Comment: (NOTE) INR goal varies based on device and disease states. Performed at [facility], [place] [address] [place], [place] [address]    aPTT 01/29/2022  28  24 - 36 seconds Final   Performed at [facility], [place] [address], [place], [place] [address]   WBC Count 01/29/2022 9.6  4.0 - 10.5 K/uL Final   RBC 01/29/2022 4.51  3.87 - 5.11 MIL/uL Final   Hemoglobin 01/29/2022 12.3  12.0 - 15.0 g/dL Final   HCT 01/29/2022 38.2  36.0 - 46.0 % Final   MCV 01/29/2022 84.7  80.0 - 100.0 fL Final   MCH 01/29/2022 27.3  26.0 - 34.0 pg Final   MCHC 01/29/2022 32.2  30.0 - 36.0 g/dL Final   RDW 01/29/2022 20.9 (H)  11.5 - 15.5 % Final   Platelet Count 01/29/2022 226  150 - 400 K/uL Final   nRBC 01/29/2022 0.0  0.0 - 0.2 % Final   Neutrophils Relative % 01/29/2022 56  % Final   Neutro Abs 01/29/2022 5.4  1.7 - 7.7 K/uL Final   Lymphocytes Relative 01/29/2022 34  % Final   Lymphs Abs 01/29/2022 3.3  0.7 - 4.0 K/uL Final   Monocytes Relative 01/29/2022 6  % Final   Monocytes Absolute 01/29/2022 0.6  0.1 - 1.0 K/uL Final   Eosinophils Relative 01/29/2022 3  % Final   Eosinophils Absolute 01/29/2022 0.3  0.0 - 0.5 K/uL Final   Basophils Relative 01/29/2022 1  % Final   Basophils Absolute 01/29/2022 0.1  0.0 - 0.1 K/uL Final   Immature Granulocytes 01/29/2022 0  % Final   Abs Immature Granulocytes 01/29/2022 0.02  0.00 - 0.07 K/uL Final   Performed at [facility], [place] [address] [place], [place] [address]   Ferritin 01/29/2022 56  11 - 307 ng/mL Final   Performed at [facility], [place] [address] [place], [place] [address]    RADIOGRAPHIC STUDIES: I have personally reviewed the radiological images as listed and agree with the findings in the report  No results found.  ASSESSMENT/PLAN  Patient is a year old  52 female with symptomatic microcytic anemia presumed to be secondary to chronic blood loss.    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    November 2023- Received 800 mg Venofer with improvement in Hgb to 12.3 January 29 2022- Referred  to GI for consideration of endoscopy given patient's age.  Placed on Flintstone's MVI with iron  March 11 2022- GI Consult.  Plans for EGD and colonoscopy   Dysfunctional uterine bleeding:  Has been seen by Gynecology for this.   November 06 2021- Exacerbated by antiplatelet effects of plavix and lexapro.  No evidence of coagulopathy as judged by screening labs  April 30 2022- New Gyn referral because has not been seen by primary gynecologist  Tobacco use:  Discussed strategies for smoking cessation esp given Burger's disease  April 30 2022- Down to 4 cigarettes daily.      Cancer Staging  No matching staging information was found for the patient.   No problem-specific Assessment & Plan notes found for this encounter.   No orders of the defined types were placed in this encounter.  33  minutes  was spent in patient care.  This included time spent preparing to see the patient (e.g., review of tests), obtaining and/or reviewing separately obtained history, counseling and educating the patient, ordering tests; documenting clinical information in the electronic or other health record, independently interpreting results and communicating results to the patient as well as coordination of care.      All questions were answered. The patient knows to call the clinic with any problems, questions or concerns.  This note was electronically signed.    Barbee Cough, MD  04/30/2022 8:49 AM

## 2022-05-06 ENCOUNTER — Encounter: Payer: Self-pay | Admitting: Oncology

## 2022-05-10 ENCOUNTER — Encounter: Payer: Self-pay | Admitting: Oncology

## 2022-05-14 ENCOUNTER — Encounter: Payer: Self-pay | Admitting: Internal Medicine

## 2022-05-14 ENCOUNTER — Ambulatory Visit: Payer: Managed Care, Other (non HMO) | Admitting: Internal Medicine

## 2022-05-14 VITALS — BP 135/60 | HR 57 | Temp 96.0°F | Resp 12 | Ht 65.0 in | Wt 166.0 lb

## 2022-05-14 DIAGNOSIS — D125 Benign neoplasm of sigmoid colon: Secondary | ICD-10-CM | POA: Diagnosis not present

## 2022-05-14 DIAGNOSIS — K449 Diaphragmatic hernia without obstruction or gangrene: Secondary | ICD-10-CM

## 2022-05-14 DIAGNOSIS — K2990 Gastroduodenitis, unspecified, without bleeding: Secondary | ICD-10-CM | POA: Diagnosis not present

## 2022-05-14 DIAGNOSIS — D124 Benign neoplasm of descending colon: Secondary | ICD-10-CM

## 2022-05-14 DIAGNOSIS — K2980 Duodenitis without bleeding: Secondary | ICD-10-CM

## 2022-05-14 DIAGNOSIS — D122 Benign neoplasm of ascending colon: Secondary | ICD-10-CM

## 2022-05-14 DIAGNOSIS — D123 Benign neoplasm of transverse colon: Secondary | ICD-10-CM

## 2022-05-14 DIAGNOSIS — K2900 Acute gastritis without bleeding: Secondary | ICD-10-CM

## 2022-05-14 DIAGNOSIS — D509 Iron deficiency anemia, unspecified: Secondary | ICD-10-CM

## 2022-05-14 DIAGNOSIS — K2981 Duodenitis with bleeding: Secondary | ICD-10-CM

## 2022-05-14 DIAGNOSIS — K635 Polyp of colon: Secondary | ICD-10-CM | POA: Diagnosis not present

## 2022-05-14 DIAGNOSIS — K259 Gastric ulcer, unspecified as acute or chronic, without hemorrhage or perforation: Secondary | ICD-10-CM

## 2022-05-14 MED ORDER — SODIUM CHLORIDE 0.9 % IV SOLN: 500.0000 mL | Freq: Once | INTRAVENOUS | Status: DC

## 2022-05-14 MED ORDER — PANTOPRAZOLE SODIUM 20 MG PO TBEC: 20.0000 mg | DELAYED_RELEASE_TABLET | Freq: Two times a day (BID) | ORAL | 0 refills | Status: DC

## 2022-05-14 MED ORDER — PANTOPRAZOLE SODIUM 40 MG PO TBEC: 40.0000 mg | DELAYED_RELEASE_TABLET | Freq: Every day | ORAL | 3 refills | Status: DC

## 2022-05-14 NOTE — Patient Instructions (Addendum)
Take your new medication pantoprazole as ordered.  No advil, aleve nor aspirin.  No BC powders.  Restart your plavix tomorrow. Cut back on your alcohol.  You will need another colonoscopy in 3 years.  YOU HAD AN ENDOSCOPIC PROCEDURE TODAY AT [facility]:   Refer to the procedure report that was given to you for any specific questions about what was found during the examination.  If the procedure report does not answer your questions, please call your gastroenterologist to clarify.  If you requested that your care partner not be given the details of your procedure findings, then the procedure report has been included in a sealed envelope for you to review at your convenience later.  YOU SHOULD EXPECT: Some feelings of bloating in the abdomen. Passage of more gas than usual.  Walking can help get rid of the air that was put into your GI tract during the procedure and reduce the bloating. If you had a lower endoscopy (such as a colonoscopy or flexible sigmoidoscopy) you may notice spotting of blood in your stool or on the toilet paper. If you underwent a bowel prep for your procedure, you may not have a normal bowel movement for a few days.  Please Note:  You might notice some irritation and congestion in your nose or some drainage.  This is from the oxygen used during your procedure.  There is no need for concern and it should clear up in a day or so.  SYMPTOMS TO REPORT IMMEDIATELY:  Following lower endoscopy (colonoscopy or flexible sigmoidoscopy):  Excessive amounts of blood in the stool  Significant tenderness or worsening of abdominal pains  Swelling of the abdomen that is new, acute  Fever of 100F or higher  Following upper endoscopy (EGD)  Vomiting of blood or coffee ground material  New chest pain or pain under the shoulder blades  Painful or persistently difficult swallowing  New shortness of breath  Fever of 100F or higher  Black, tarry-looking stools  For urgent or  emergent issues, a gastroenterologist can be reached at any hour by calling [phone]. Do not use MyChart messaging for urgent concerns.    DIET:  We do recommend a small meal at first, but then you may proceed to your regular diet.  Drink plenty of fluids but you should avoid alcoholic beverages for 24 hours.  ACTIVITY:  You should plan to take it easy for the rest of today and you should NOT DRIVE or use heavy machinery until tomorrow (because of the sedation medicines used during the test).    FOLLOW UP: Our staff will call the number listed on your records the next business day following your procedure.  We will call around 7:15- 8:00 am to check on you and address any questions or concerns that you may have regarding the information given to you following your procedure. If we do not reach you, we will leave a message.     If any biopsies were taken you will be contacted by phone or by letter within the next 1-3 weeks.  Please call us at [phone] if you have not heard about the biopsies in 3 weeks.    SIGNATURES/CONFIDENTIALITY: You and/or your care partner have signed paperwork which will be entered into your electronic medical record.  These signatures attest to the fact that that the information above on your After Visit Summary has been reviewed and is understood.  Full responsibility of the confidentiality of this discharge information  lies with you and/or your care-partner.

## 2022-05-14 NOTE — Progress Notes (Signed)
GASTROENTEROLOGY PROCEDURE H&P NOTE   Primary Care Physician: Susy Frizzle, MD    Reason for Procedure:   IDA, weight loss  Plan:    EGD/colonoscopy  Patient is appropriate for endoscopic procedure(s) in the ambulatory ([place]) setting.  The nature of the procedure, as well as the risks, benefits, and alternatives were carefully and thoroughly reviewed with the patient. Ample time for discussion and questions allowed. The patient understood, was satisfied, and agreed to proceed.     HPI: Cassie Lynn is a 52 y.o. female who presents for EGD/colonoscopy for evaluation of IDA and weight loss.  Patient was most recently seen in the Gastroenterology Clinic on 03/11/22.  No interval change in medical history since that appointment. Please refer to that note for full details regarding GI history and clinical presentation.   Past Medical History:  Diagnosis Date   Anemia    Aneurysm ([place])    brain- 2015ish was gone   Buerger's disease ([place])    pvd with Raynaud's phenomenon   Fibromyalgia    Migraine    Migraines.  Takes Depakote   Shortness of breath dyspnea     Past Surgical History:  Procedure Laterality Date   CESAREAN SECTION     CHOLECYSTECTOMY     IR RADIOLOGY PERIPHERAL GUIDED IV START  10/13/2017   IR US GUIDE VASC ACCESS RIGHT  10/13/2017   LUMBAR LAMINECTOMY/DECOMPRESSION MICRODISCECTOMY Left 04/18/2015   Procedure: Left Lumbar five sacral-one  Microdiskectomy;  Surgeon: Kevan Ny Ditty, MD;  Location: [facility];  Service: Neurosurgery;  Laterality: Left;    Prior to Admission medications   Medication Sig Start Date End Date Taking? Authorizing Provider  escitalopram (LEXAPRO) 10 MG tablet TAKE 1 TABLET BY MOUTH EVERY DAY 04/13/21  Yes Susy Frizzle, MD  gabapentin (NEURONTIN) 300 MG capsule TAKE 1 CAPSULE BY MOUTH THREE TIMES A DAY 09/16/20  Yes Susy Frizzle, MD  losartan (COZAAR) 100 MG tablet TAKE 1/2 TABLET BY MOUTH EVERY DAY 02/06/19  Yes  Susy Frizzle, MD  metoprolol tartrate (LOPRESSOR) 50 MG tablet Take 25 mg by mouth daily.   Yes [provider]  rosuvastatin (CRESTOR) 20 MG tablet Take 1 tablet (20 mg total) by mouth daily. 07/16/20  Yes Susy Frizzle, MD  SUMAtriptan (IMITREX) 100 MG tablet sumatriptan 100 mg tablet   Yes [provider]  topiramate (TOPAMAX) 50 MG tablet TAKE 1 TABLET BY MOUTH DAILY AND 2 TABLETS EVERY NIGHT 10/12/18  Yes Susy Frizzle, MD  ALPRAZolam Duanne Moron) 0.5 MG tablet Take 1 tablet (0.5 mg total) by mouth 3 (three) times daily as needed. 03/19/21   Susy Frizzle, MD  amLODipine (NORVASC) 10 MG tablet Take by mouth. 01/12/19   [provider]  clopidogrel (PLAVIX) 75 MG tablet TAKE 1 TABLET BY MOUTH EVERY DAY 01/02/21   Susy Frizzle, MD  cyclobenzaprine (FLEXERIL) 5 MG tablet Take by mouth. 05/17/20   [provider]    Current Outpatient Medications  Medication Sig Dispense Refill   escitalopram (LEXAPRO) 10 MG tablet TAKE 1 TABLET BY MOUTH EVERY DAY 90 tablet 2   gabapentin (NEURONTIN) 300 MG capsule TAKE 1 CAPSULE BY MOUTH THREE TIMES A DAY 270 capsule 0   losartan (COZAAR) 100 MG tablet TAKE 1/2 TABLET BY MOUTH EVERY DAY 90 tablet 1   metoprolol tartrate (LOPRESSOR) 50 MG tablet Take 25 mg by mouth daily.     rosuvastatin (CRESTOR) 20 MG tablet Take 1 tablet (  20 mg total) by mouth daily. 90 tablet 1   SUMAtriptan (IMITREX) 100 MG tablet sumatriptan 100 mg tablet     topiramate (TOPAMAX) 50 MG tablet TAKE 1 TABLET BY MOUTH DAILY AND 2 TABLETS EVERY NIGHT 90 tablet 2   ALPRAZolam (XANAX) 0.5 MG tablet Take 1 tablet (0.5 mg total) by mouth 3 (three) times daily as needed. 30 tablet 0   amLODipine (NORVASC) 10 MG tablet Take by mouth.     clopidogrel (PLAVIX) 75 MG tablet TAKE 1 TABLET BY MOUTH EVERY DAY 90 tablet 3   cyclobenzaprine (FLEXERIL) 5 MG tablet Take by mouth.     Current Facility-Administered Medications  Medication Dose Route  Frequency Provider Last Rate Last Admin   0.9 %  sodium chloride infusion  500 mL Intravenous Once Sharyn Creamer, MD        Allergies as of 05/14/2022 - Review Complete 05/14/2022  Allergen Reaction Noted   Vicodin [hydrocodone-acetaminophen] Other (See Comments) 11/22/2010    Family History  Problem Relation Age of Onset   Hypertension Mother    Lung cancer Mother    Hypertension Father    Hypertension Sister    Bipolar disorder Sister    Cerebral palsy Sister     Social History   Socioeconomic History   Marital status: Single    Spouse name: Not on file   Number of children: Not on file   Years of education: Not on file   Highest education level: Not on file  Occupational History   Not on file  Tobacco Use   Smoking status: Every Day    Years: 28    Types: Cigarettes   Smokeless tobacco: Never   Tobacco comments:    1 pack lasts 3 days.  Vaping Use   Vaping Use: Never used  Substance and Sexual Activity   Alcohol use: Yes    Comment: occ    Drug use: No   Sexual activity: Not on file  Other Topics Concern   Not on file  Social History Narrative   Leave with boyfriend   1-story home 2 steps at the front   1-cup a day   Social Determinants of Health   Financial Resource Strain: Not on file  Food Insecurity: Not on file  Transportation Needs: Not on file  Physical Activity: Not on file  Stress: Not on file  Social Connections: Not on file  Intimate Partner Violence: Not on file    Physical Exam: Vital signs in last 24 hours: BP 120/68   Pulse 66   Temp (!) 96 F (35.6 C)   Ht 5\' 5"  (1.651 m)   Wt 166 lb (75.3 kg)   SpO2 99%   BMI 27.62 kg/m  GEN: NAD EYE: Sclerae anicteric ENT: MMM CV: Non-tachycardic Pulm: No increased WOB GI: Soft NEURO:  Alert & Oriented   Christia Reading, MD [facility]   05/14/2022 8:05 AM

## 2022-05-14 NOTE — Progress Notes (Signed)
Pt's states no medical or surgical changes since previsit or office visit. 

## 2022-05-14 NOTE — Op Note (Signed)
[place] Patient Name: Cassie Lynn Procedure Date: 05/14/2022 7:53 AM MRN: [id number] Endoscopist: Georgian Co , , [id number] Age: 52 Referring MD:  Date of Birth: May 20, 1970 Gender: Female Account #: [id number] Procedure:                Colonoscopy Indications:              Iron deficiency anemia Medicines:                Monitored Anesthesia Care Procedure:                Pre-Anesthesia Assessment:                           - Prior to the procedure, a History and Physical                            was performed, and patient medications and                            allergies were reviewed. The patient's tolerance of                            previous anesthesia was also reviewed. The risks                            and benefits of the procedure and the sedation                            options and risks were discussed with the patient.                            All questions were answered, and informed consent                            was obtained. Prior Anticoagulants: The patient has                            taken Plavix (clopidogrel), last dose was 7 days                            prior to procedure. ASA Grade Assessment: II - A                            patient with mild systemic disease. After reviewing                            the risks and benefits, the patient was deemed in                            satisfactory condition to undergo the procedure.                           After obtaining informed consent, the colonoscope  was passed under direct vision. Throughout the                            procedure, the patient's blood pressure, pulse, and                            oxygen saturations were monitored continuously. The                            Olympus [id number] ([id number]) Colonoscope was                            introduced through the anus and advanced to the the                            cecum,  identified by appendiceal orifice and                            ileocecal valve. The colonoscopy was performed                            without difficulty. The patient tolerated the                            procedure well. The quality of the bowel                            preparation was fair. The ileocecal valve,                            appendiceal orifice, and rectum were photographed. Scope In: 8:18:26 AM Scope Out: 8:56:31 AM Scope Withdrawal Time: 0 hours 29 minutes 57 seconds  Total Procedure Duration: 0 hours 38 minutes 5 seconds  Findings:                 Three sessile polyps were found in the transverse                            colon and ascending colon. The polyps were 3 to 5                            mm in size. These polyps were removed with a cold                            snare. Resection and retrieval were complete.                           Three sessile polyps were found in the descending                            colon. The polyps were 3 to 6 mm in size. These  polyps were removed with a cold snare. Resection                            and retrieval were complete.                           Two pedunculated polyps were found in the sigmoid                            colon. The polyps were 6 to 10 mm in size. These                            polyps were removed with a hot snare. Resection and                            retrieval were complete.                           Non-bleeding internal hemorrhoids were found during                            retroflexion. Complications:            No immediate complications. Estimated Blood Loss:     Estimated blood loss was minimal. Impression:               - Preparation of the colon was fair.                           - Three 3 to 5 mm polyps in the transverse colon                            and in the ascending colon, removed with a cold                            snare. Resected and  retrieved.                           - Three 3 to 6 mm polyps in the descending colon,                            removed with a cold snare. Resected and retrieved.                           - Two 6 to 10 mm polyps in the sigmoid colon,                            removed with a hot snare. Resected and retrieved.                           - Non-bleeding internal hemorrhoids. Recommendation:           - Discharge patient to home (with escort).                           -  Await pathology results.                           - Okay to restart your Plavix tomorrow.                           - The findings and recommendations were discussed                            with the patient.                           - Repeat colonoscopy in 3 years for surveillance                            with a 2-day preparation. Dr Georgian Co "[place]" [place],  05/14/2022 9:09:55 AM

## 2022-05-14 NOTE — Progress Notes (Signed)
Vss nAD TRANS TO PACU NBOVIE SITE OK NO REDNESS

## 2022-05-14 NOTE — Op Note (Signed)
[place] Patient Name: Cassie Lynn Procedure Date: 05/14/2022 7:55 AM MRN: [id number] Endoscopist: Georgian Co , , [id number] Age: 52 Referring MD:  Date of Birth: [birth year] Gender: Female Account #: [id number] Procedure:                Upper GI endoscopy Indications:              Iron deficiency anemia Medicines:                Monitored Anesthesia Care Procedure:                Pre-Anesthesia Assessment:                           - Prior to the procedure, a History and Physical                            was performed, and patient medications and                            allergies were reviewed. The patient's tolerance of                            previous anesthesia was also reviewed. The risks                            and benefits of the procedure and the sedation                            options and risks were discussed with the patient.                            All questions were answered, and informed consent                            was obtained. Prior Anticoagulants: The patient has                            taken Plavix (clopidogrel), last dose was 7 days                            prior to procedure. ASA Grade Assessment: II - A                            patient with mild systemic disease. After reviewing                            the risks and benefits, the patient was deemed in                            satisfactory condition to undergo the procedure.                           After obtaining informed consent, the endoscope was  passed under direct vision. Throughout the                            procedure, the patient's blood pressure, pulse, and                            oxygen saturations were monitored continuously. The                            Olympus scope [id number] was introduced through the                            mouth, and advanced to the second part of duodenum.                             The upper GI endoscopy was accomplished without                            difficulty. The patient tolerated the procedure                            well. Scope In: Scope Out: Findings:                 The examined esophagus was normal.                           A hiatal hernia was present.                           Localized inflammation characterized by congestion                            (edema), erosions and erythema was found in the                            gastric antrum. Biopsies were taken with a cold                            forceps for histology.                           Localized inflammation characterized by congestion                            (edema) and erythema was found in the duodenal bulb                            and in the second portion of the duodenum. Biopsies                            were taken with a cold forceps for histology. Complications:            No immediate complications. Estimated Blood Loss:     Estimated blood loss was minimal. Impression:               -  Normal esophagus.                           - Hiatal hernia.                           - Gastritis. Biopsied.                           - Duodenitis. Biopsied. Recommendation:           - Await pathology results.                           - Use Protonix (pantoprazole) 40 mg PO BID for 8                            weeks.                           - No ibuprofen, naproxen, or other non-steroidal                            anti-inflammatory drugs.                           - Return to GI clinic in 6 weeks.                           - Perform a colonoscopy today. Dr Georgian Co "[place]" [place],  05/14/2022 9:04:46 AM

## 2022-05-14 NOTE — Progress Notes (Signed)
Called to room to assist during endoscopic procedure.  Patient ID and intended procedure confirmed with present staff. Received instructions for my participation in the procedure from the performing physician.  

## 2022-05-17 ENCOUNTER — Telehealth: Payer: Self-pay

## 2022-05-17 NOTE — Telephone Encounter (Signed)
  Follow up Call-     05/14/2022    7:19 AM  Call back number  Post procedure Call Back phone  # [phone]  Permission to leave phone message Yes     Patient questions:  Do you have a fever, pain , or abdominal swelling? No. Pain Score  0 *  Have you tolerated food without any problems? Yes.    Have you been able to return to your normal activities? Yes.    Do you have any questions about your discharge instructions: Diet   No. Medications  No. Follow up visit  No.  Do you have questions or concerns about your Care? No.  Actions: * If pain score is 4 or above: No action needed, pain <4.

## 2022-05-18 ENCOUNTER — Encounter: Payer: Self-pay | Admitting: Internal Medicine

## 2022-06-01 ENCOUNTER — Other Ambulatory Visit (HOSPITAL_COMMUNITY)
Admission: RE | Admit: 2022-06-01 | Discharge: 2022-06-01 | Disposition: A | Discharge status: Home / Self Care | Payer: Managed Care, Other (non HMO) | Source: Ambulatory Visit | Attending: Nurse Practitioner | Admitting: Nurse Practitioner

## 2022-06-01 ENCOUNTER — Ambulatory Visit: Payer: Managed Care, Other (non HMO) | Admitting: Nurse Practitioner

## 2022-06-01 ENCOUNTER — Encounter: Payer: Self-pay | Admitting: Nurse Practitioner

## 2022-06-01 VITALS — BP 130/82 | HR 80 | Ht 66.0 in | Wt 164.0 lb

## 2022-06-01 DIAGNOSIS — N814 Uterovaginal prolapse, unspecified: Secondary | ICD-10-CM

## 2022-06-01 DIAGNOSIS — Z124 Encounter for screening for malignant neoplasm of cervix: Secondary | ICD-10-CM | POA: Insufficient documentation

## 2022-06-01 DIAGNOSIS — N939 Abnormal uterine and vaginal bleeding, unspecified: Secondary | ICD-10-CM | POA: Diagnosis not present

## 2022-06-01 DIAGNOSIS — D500 Iron deficiency anemia secondary to blood loss (chronic): Secondary | ICD-10-CM | POA: Diagnosis not present

## 2022-06-01 MED ORDER — NORETHINDRONE 0.35 MG PO TABS: 1.0000 | ORAL_TABLET | Freq: Every day | ORAL | 0 refills | Status: DC

## 2022-06-01 NOTE — Progress Notes (Signed)
   Acute Office Visit  Subjective:    Patient ID: Cassie Lynn, female    DOB: [birth year], 52 y.o.   MRN: [id number]   HPI 52 y.o. [id number] presents as new patient for irregular, heavy periods. Menses occur every 2-4 weeks with heavy bleeding 2-7 days then tapers off quickly. Changes large pads ~8 times per day on heavy days, sometimes clots the size of her hand. D&C last year for DUB. H/O iron deficiency anemia, followed by hematology, last iron transfusion in November 2023. 04/30/2022 hgb 13.6, hct 41.3, ferritin 9. Does feel bleeding became heavy after starting Plavix. Colonoscopy/endoscopy 05/14/2022 -  polyps, gastritis, duodenitis, started on protonix. Having hot flashes and night sweats but this has been ongoing for years per patient. Pap around 5 years ago. LEEP at age 69. Plavix for brain aneurysm. Smoker. Complains of feeling pressure at vaginal opening sometimes.   Patient's last menstrual period was 05/27/2022 (approximate).    Review of Systems  Constitutional:  Positive for fatigue.  Genitourinary:  Positive for menstrual problem.       Objective:    Physical Exam Constitutional:      Appearance: Normal appearance.  Genitourinary:    General: Normal vulva.     Vagina: Normal.     Cervix: Normal.     Uterus: With uterine prolapse (Grade 2).      Adnexa: Right adnexa normal and left adnexa normal.     BP 130/82   Pulse 80   Ht 5\' 6"  (1.676 m)   Wt 164 lb (74.4 kg)   LMP 05/27/2022 (Approximate)   SpO2 100%   BMI 26.47 kg/m  Wt Readings from Last 3 Encounters:  06/01/22 164 lb (74.4 kg)  05/14/22 166 lb (75.3 kg)  04/30/22 166 lb 1 oz (75.3 kg)        Patient informed chaperone available to be present for breast and/or pelvic exam. Patient has requested no chaperone to be present. Patient has been advised what will be completed during breast and pelvic exam.   Assessment & Plan:   Problem List Items Addressed This Visit       Other   Anemia    Other Visit Diagnoses     Abnormal uterine bleeding    -  Primary   Relevant Medications   norethindrone (ORTHO MICRONOR) 0.35 MG tablet   Other Relevant Orders   US PELVIS TRANSVAGINAL NON-OB (TV ONLY)   Screening for cervical cancer       Relevant Orders   Cytology - PAP( [place])   Uterine prolapse          Plan: Will schedule pelvic ultrasound. Abnormal bleeding could be s/t perimenopause + Plavix use. Start POPs for bleeding control. Pap pending.      Olivia Mackie DNP, 4:49 PM 06/01/2022

## 2022-06-03 LAB — CYTOLOGY - PAP
Comment: NEGATIVE
Diagnosis: NEGATIVE
High risk HPV: NEGATIVE

## 2022-07-02 ENCOUNTER — Ambulatory Visit: Payer: Managed Care, Other (non HMO) | Admitting: Oncology

## 2022-07-02 ENCOUNTER — Other Ambulatory Visit: Payer: Managed Care, Other (non HMO)

## 2022-07-15 ENCOUNTER — Ambulatory Visit (INDEPENDENT_AMBULATORY_CARE_PROVIDER_SITE_OTHER): Payer: Managed Care, Other (non HMO)

## 2022-07-15 ENCOUNTER — Ambulatory Visit: Payer: Managed Care, Other (non HMO) | Admitting: Nurse Practitioner

## 2022-07-15 VITALS — BP 120/64

## 2022-07-15 DIAGNOSIS — N939 Abnormal uterine and vaginal bleeding, unspecified: Secondary | ICD-10-CM

## 2022-07-15 MED ORDER — NORETHINDRONE 0.35 MG PO TABS: 1.0000 | ORAL_TABLET | Freq: Every day | ORAL | 1 refills | Status: DC

## 2022-07-15 NOTE — Progress Notes (Signed)
   Acute Office Visit  Subjective:    Patient ID: Cassie Lynn, female    DOB: April 20, 1970, 52 y.o.   MRN: [id number]   HPI 51 y.o. [id number] presents today for ultrasound.  Seen as new patient 06/01/2022 with complaints for irregular, heavy periods. Menses occur every 2-4 weeks with heavy bleeding 2-7 days then tapers off quickly. Changes large pads ~8 times per day on heavy days, sometimes clots the size of her hand. D&C last year for DUB. H/O iron deficiency anemia, followed by hematology, last iron transfusion in November 2023. 04/30/2022 hgb 13.6, hct 41.3, ferritin 9. Does feel bleeding became heavy after starting Plavix for brain aneurysm. Started Micronor after last visit. Bleeding has continued but is much lighter. Smoker.    No LMP recorded.    Review of Systems  Constitutional: Negative.   Genitourinary:  Positive for menstrual problem.       Objective:    Physical Exam Constitutional:      Appearance: Normal appearance.   GU: Not indicated  BP 120/64 (BP Location: Left Arm, Patient Position: Sitting, Cuff Size: Normal)  Wt Readings from Last 3 Encounters:  06/01/22 164 lb (74.4 kg)  05/14/22 166 lb (75.3 kg)  04/30/22 166 lb 1 oz (75.3 kg)        Assessment & Plan:   Problem List Items Addressed This Visit   None Visit Diagnoses     Abnormal uterine bleeding    -  Primary   Relevant Medications   norethindrone (ORTHO MICRONOR) 0.35 MG tablet      Vaginal ultrasound: Anteverted uterus, normal size and shape, no myometrial masses.  Thin, symmetrical endometrium - 3.5 mm.  No masses or thickening seen.  Right ovary small with no visible follicles.  Left ovary with a 22 x 20 mm simple follicle.  No adnexal masses, no free fluid.  Plan: Ultrasound reviewed. Continue POPs. Bleeding has lightened significantly but still occurring daily. Recommend 3 months before discontinuing. Discussed again that irregularity and changes in flow likely from combination of  perimenopause and blood thinnger. Interested in surgical options if no improvement.      Olivia Mackie DNP, 3:00 PM 07/15/2022

## 2022-07-22 ENCOUNTER — Telehealth: Payer: Self-pay | Admitting: Oncology

## 2022-07-22 NOTE — Telephone Encounter (Signed)
Called patient regarding June appointments, patient is notified. 

## 2022-07-28 ENCOUNTER — Ambulatory Visit: Payer: Managed Care, Other (non HMO) | Admitting: Family Medicine

## 2022-07-28 ENCOUNTER — Encounter: Payer: Self-pay | Admitting: Family Medicine

## 2022-07-28 VITALS — BP 124/64 | HR 76 | Temp 98.4°F | Ht 66.0 in | Wt 160.2 lb

## 2022-07-28 DIAGNOSIS — J069 Acute upper respiratory infection, unspecified: Secondary | ICD-10-CM

## 2022-07-28 NOTE — Assessment & Plan Note (Signed)

## 2022-07-28 NOTE — Progress Notes (Signed)
Subjective:  HPI: Cassie Lynn is a 52 y.o. female presenting on 07/28/2022 for Nasal Congestion (Pt c/o nasal and chest congestion x 3 days. ) and Depression (GAD score of 11. PHQ-9 score of 16.)   Depression        Patient is in today for 7 days of a "head cold and chest cold". Rhinorrhea, mucopurulent cough, congestion. Not improving, unchanged. Denies fever, shortness of breath, chest pain, ear pain, sore throat, body aches, chills, sinus pressure. No known sick exposures, negative home covid.  Has tried Aleve cold and sinus, mucinex.  Review of Systems  Psychiatric/Behavioral:  Positive for depression.   All other systems reviewed and are negative.   Relevant past medical history reviewed and updated as indicated.   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  Takes Depakote   Shortness of breath dyspnea      Past Surgical History:  Procedure Laterality Date   CERVICAL BIOPSY  W/ LOOP ELECTRODE EXCISION     CESAREAN SECTION     CHOLECYSTECTOMY     IR RADIOLOGY PERIPHERAL GUIDED IV START  10/13/2017   IR US GUIDE VASC ACCESS RIGHT  10/13/2017   LUMBAR LAMINECTOMY/DECOMPRESSION MICRODISCECTOMY Left 04/18/2015   Procedure: Left Lumbar five sacral-one  Microdiskectomy;  Surgeon: Loura Halt Ditty, MD;  Location: [facility];  Service: Neurosurgery;  Laterality: Left;    Allergies and medications reviewed and updated.   Current Outpatient Medications:    ALPRAZolam (XANAX) 0.5 MG tablet, Take 1 tablet (0.5 mg total) by mouth 3 (three) times daily as needed., Disp: 30 tablet, Rfl: 0   amLODipine (NORVASC) 10 MG tablet, Take by mouth., Disp: , Rfl:    clopidogrel (PLAVIX) 75 MG tablet, TAKE 1 TABLET BY MOUTH EVERY DAY, Disp: 90 tablet, Rfl: 3   cyclobenzaprine (FLEXERIL) 5 MG tablet, Take by mouth., Disp: , Rfl:     escitalopram (LEXAPRO) 10 MG tablet, TAKE 1 TABLET BY MOUTH EVERY DAY, Disp: 90 tablet, Rfl: 2   gabapentin (NEURONTIN) 300 MG capsule, TAKE 1 CAPSULE BY MOUTH THREE TIMES A DAY, Disp: 270 capsule, Rfl: 0   losartan (COZAAR) 100 MG tablet, TAKE 1/2 TABLET BY MOUTH EVERY DAY, Disp: 90 tablet, Rfl: 1   metoprolol tartrate (LOPRESSOR) 50 MG tablet, Take 25 mg by mouth daily., Disp: , Rfl:    norethindrone (ORTHO MICRONOR) 0.35 MG tablet, Take 1 tablet (0.35 mg total) by mouth daily., Disp: 84 tablet, Rfl: 1   pantoprazole (PROTONIX) 40 MG tablet, Take 1 tablet (40 mg total) by mouth daily., Disp: 90 tablet, Rfl: 3   rosuvastatin (CRESTOR) 20 MG tablet, Take 1 tablet (20 mg total) by mouth daily., Disp: 90 tablet, Rfl: 1   SUMAtriptan (IMITREX) 100 MG tablet, sumatriptan 100 mg tablet, Disp: , Rfl:    topiramate (TOPAMAX) 50 MG tablet, TAKE 1 TABLET BY MOUTH DAILY AND 2 TABLETS EVERY NIGHT, Disp: 90 tablet, Rfl: 2  Allergies  Allergen Reactions   Vicodin [Hydrocodone-Acetaminophen] Other (See Comments)    Makes her feel like she cannot breath, but no throat swelling. No issues with plain Tylenol    Objective:   BP 124/64   Pulse 76   Temp 98.4 F (36.9 C)   Ht 5\' 6"  (1.676 m)   Wt  160 lb 3.2 oz (72.7 kg)   SpO2 99%   BMI 25.86 kg/m      07/28/2022    2:33 PM 07/15/2022    2:07 PM 06/01/2022    2:58 PM  Vitals with BMI  Height 5\' 6"   5\' 6"   Weight 160 lbs 3 oz  164 lbs  BMI 25.87  26.48  Systolic 124 120 161  Diastolic 64 64 82  Pulse 76  80     Physical Exam Vitals and nursing note reviewed.  Constitutional:      Appearance: Normal appearance. She is normal weight.  HENT:     Head: Normocephalic and atraumatic.  Cardiovascular:     Rate and Rhythm: Normal rate and regular rhythm.     Pulses: Normal pulses.     Heart sounds: Normal heart sounds.  Pulmonary:     Effort: Pulmonary effort is normal.     Breath sounds: Rhonchi present.  Skin:    General: Skin is warm and  dry.  Neurological:     General: No focal deficit present.     Mental Status: She is alert and oriented to person, place, and time. Mental status is at baseline.  Psychiatric:        Mood and Affect: Mood normal.        Behavior: Behavior normal.        Thought Content: Thought content normal.        Judgment: Judgment normal.     Assessment & Plan:  Viral URI with cough Assessment & Plan: Reassured patient that symptoms and exam findings are most consistent with a viral upper respiratory infection and explained lack of efficacy of antibiotics against viruses.  Discussed expected course and features suggestive of secondary bacterial infection.  Continue supportive care. Increase fluid intake with water or electrolyte solution like pedialyte. Encouraged acetaminophen as needed for fever/pain. Encouraged salt water gargling, chloraseptic spray and throat lozenges. Encouraged OTC guaifenesin. Encouraged saline sinus flushes and/or neti with humidified air.        Follow up plan: Return if symptoms worsen or fail to improve.  Park Meo, FNP

## 2022-07-28 NOTE — Patient Instructions (Signed)

## 2022-07-29 ENCOUNTER — Other Ambulatory Visit: Payer: Self-pay | Admitting: Oncology

## 2022-07-29 DIAGNOSIS — D500 Iron deficiency anemia secondary to blood loss (chronic): Secondary | ICD-10-CM

## 2022-07-29 NOTE — Progress Notes (Signed)
[facility] Cancer Follow up Visit:  Patient Care Team: Donita Brooks, MD as PCP - General (Family Medicine)  CHIEF COMPLAINTS/PURPOSE OF CONSULTATION:  Evaluation of anemia    HISTORY OF PRESENTING ILLNESS: Cassie STEFANICH 52 y.o. female is here because of  anemia Medical history notable for brain aneurysm, Buerger's disease, peripheral vascular disease, fibromyalgia August 06, 2021: CMP notable for glucose 126 October 13, 2021: WBC 11.2 hemoglobin 9.3 MCV 78 platelet count 290; 55 segs 35 lymphs 6 monos 3 eos 1 basophil.  Ferritin 6  November 06 2021:  [facility] Hematology Consult  She states that she has been anemic for quite awhile  Patient is G6  [id number] last pregnancy was 26 yrs ago.  Menopause not reached.  In the past menses occurred regularly but in the last few months have been intermittent.  Bleeding lasts 3 to 7 days and is heavy with passages of clots.  Last year underwent a D&C to manage heavy bleeding.  Last pregnancy was delivered by NSVD.  Has taken oral iron which she can tolerate but states that it her Hgb just doesn't go up.  Has never received IV iron nor required PRBC's in the past.  Has a regular diet.  No history/ history of postpartum hemorrhage requiring transfusion.   No history hemorrhage postoperatively.  No hematochezia, melena, hemoptysis, hematuria.  No history of intra-articular or soft tissue bleeding No history of abnormal bleeding in family members Patient has symptoms of fatigue, pallor,  DOE, decreased performance status.  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    Underwent EGD in the past but has not had a colonoscopy. Has tried OCP's but states that did not help.    Social:  Began smoking at 40 yoa, smokes 1/2 ppd now but used to smoke 2 1/2 ppd.  EtOH occasionally  [facility] Mother died 25 mesothelioma worked in [employer] Father alive 17 well Sister alive 67 bipolar disorder, cerebral palsy  WBC 12.0 hemoglobin 10.2 MCV 79  platelet count 351; 64 segs 29 lymphs 4 monos 1 EO.  Reticulocytes 1.2%  ferritin 5 B12 257 folate 9.1 Haptoglobin 245 Coombs test negative Fibrinogen 346 Factor VIII 153 von Willebrand factor antigen 124 activity 82   December 18 2021:  Scheduled follow up for management of anemia.  Reviewed results of labs with patient.  Has not yet received IV iron  December 29, 2021 Venofer 400 mg January 05, 2022 Venofer 400 mg  January 29 2022:  Scheduled follow up for management of anemia  Feels better since receiving IV iron.  Has not yet been seen by GI.  Smoking less than 1/2 ppd cigarettes a day.  Applauded efforts at smoking cessation.  Encouraged further steps toward abstaining.  Patient to start Flintsone's MVI with iron daily.   Reviewed labs.  Hemoglobin 12.3 today   March 11, 2022: GI consult.  Plan for EGD and colonoscopy to rule out GI etiology of iron deficiency anemia.  In setting of weight loss.  April 30, 2022:  Has not been able to make an appointment with her usual Gynecologist.  Will refer to a different Gynecologist.  Down to 4 cigarettes a day.  Taking Flintstone's MVI with iron daily and tolerating it well.  No weight change since last visit Hgb 13.6  May 14 2022:  EGD--  Localized inflammation characterized by congestion  (edema), erosions and erythema was found in the gastric antrum. Localized inflammation characterized by congestion (edema) and  erythema was found in the duodenal bulb and in the second portion of the duodenum.  Colonoscopy revealed total of 8 polyps throughout the colon removed  Jul 15 2022:  Transvaginal U/S Anteverted uterus, normal size and shape, no myometrial masses.  Thin, symmetrical endometrium - 3.5 mm.  No masses or thickening seen.   Right ovary small with no visible follicles.  Left ovary with a 22 x 20 mm simple follicle. No adnexal masses, no free fluid.  July 30 2022:   Scheduled follow-up for management of anemia.   Appetite diminished.    Taking a MVI with iron which she is tolerating well.  Smoking 4 cigarettes daily.   Patient states that she was taking goody powders prior to endoscopy.  No longer doing so.  Has been placed on OCP's by PCP.  Vaginal bleeding has slowed but not abated.      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  Takes Depakote   Shortness of breath dyspnea     SURGICAL HISTORY: Past Surgical History:  Procedure Laterality Date   CERVICAL BIOPSY  W/ LOOP ELECTRODE EXCISION     CESAREAN SECTION     CHOLECYSTECTOMY     IR RADIOLOGY PERIPHERAL GUIDED IV START  10/13/2017   IR US GUIDE VASC ACCESS RIGHT  10/13/2017   LUMBAR LAMINECTOMY/DECOMPRESSION MICRODISCECTOMY Left 04/18/2015   Procedure: Left Lumbar five sacral-one  Microdiskectomy;  Surgeon: Loura Halt Ditty, MD;  Location: [facility];  Service: Neurosurgery;  Laterality: Left;    SOCIAL HISTORY: Social History   Socioeconomic History   Marital status: Single    Spouse name: Not on file   Number of children: Not on file   Years of education: Not on file   Highest education level: Not on file  Occupational History   Not on file  Tobacco Use   Smoking status: Every Day    Years: 28    Types: Cigarettes   Smokeless tobacco: Never   Tobacco comments:    1 pack lasts 3 days.  Vaping Use   Vaping Use: Never used  Substance and Sexual Activity   Alcohol use: Yes    Comment: occ    Drug use: No   Sexual activity: Not Currently    Partners: Male  Other Topics Concern   Not on file  Social History Narrative   Leave with boyfriend   1-story home 2 steps at the front   1-cup a day   Social Determinants of Health   Financial Resource Strain: Not on file  Food Insecurity: Not on file  Transportation Needs: Not on file   Physical Activity: Not on file  Stress: Not on file  Social Connections: Not on file  Intimate Partner Violence: Not on file    FAMILY HISTORY Family History  Problem Relation Age of Onset   Hypertension Mother    Lung cancer Mother    Hypertension Father    Hypertension Sister    Bipolar disorder Sister    Cerebral palsy Sister    Breast cancer Neg Hx    Ovarian cancer Neg Hx     ALLERGIES:  is allergic to vicodin [hydrocodone-acetaminophen].  MEDICATIONS:  Current Outpatient Medications  Medication Sig Dispense Refill  ALPRAZolam (XANAX) 0.5 MG tablet Take 1 tablet (0.5 mg total) by mouth 3 (three) times daily as needed. 30 tablet 0   amLODipine (NORVASC) 10 MG tablet Take by mouth.     clopidogrel (PLAVIX) 75 MG tablet TAKE 1 TABLET BY MOUTH EVERY DAY 90 tablet 3   cyclobenzaprine (FLEXERIL) 5 MG tablet Take by mouth.     escitalopram (LEXAPRO) 10 MG tablet TAKE 1 TABLET BY MOUTH EVERY DAY 90 tablet 2   gabapentin (NEURONTIN) 300 MG capsule TAKE 1 CAPSULE BY MOUTH THREE TIMES A DAY 270 capsule 0   losartan (COZAAR) 100 MG tablet TAKE 1/2 TABLET BY MOUTH EVERY DAY 90 tablet 1   metoprolol tartrate (LOPRESSOR) 50 MG tablet Take 25 mg by mouth daily.     norethindrone (ORTHO MICRONOR) 0.35 MG tablet Take 1 tablet (0.35 mg total) by mouth daily. 84 tablet 1   pantoprazole (PROTONIX) 40 MG tablet Take 1 tablet (40 mg total) by mouth daily. 90 tablet 3   rosuvastatin (CRESTOR) 20 MG tablet Take 1 tablet (20 mg total) by mouth daily. 90 tablet 1   SUMAtriptan (IMITREX) 100 MG tablet sumatriptan 100 mg tablet     topiramate (TOPAMAX) 50 MG tablet TAKE 1 TABLET BY MOUTH DAILY AND 2 TABLETS EVERY NIGHT 90 tablet 2   No current facility-administered medications for this visit.    PHYSICAL EXAMINATION:  ECOG PERFORMANCE STATUS: 1 - Symptomatic but completely ambulatory   Vitals:   07/30/22 1335  BP: 121/79  Pulse: 96  Resp: 17  Temp: 98.5 F (36.9 C)  SpO2: 98%     Filed Weights   07/30/22 1335  Weight: 158 lb 8 oz (71.9 kg)     Physical Exam Vitals and nursing note reviewed.  Constitutional:      Appearance: Normal appearance. She is not toxic-appearing or diaphoretic.     Comments: Here alone  HENT:     Head: Normocephalic and atraumatic.     Right Ear: External ear normal.     Left Ear: External ear normal.     Nose: Nose normal. No congestion or rhinorrhea.  Eyes:     General: No scleral icterus.    Extraocular Movements: Extraocular movements intact.     Conjunctiva/sclera: Conjunctivae normal.     Pupils: Pupils are equal, round, and reactive to light.  Cardiovascular:     Rate and Rhythm: Normal rate and regular rhythm.     Pulses: Normal pulses.     Heart sounds: Normal heart sounds. No murmur heard.    No friction rub. No gallop.  Pulmonary:     Effort: Pulmonary effort is normal. No respiratory distress.     Breath sounds: Normal breath sounds. No stridor. No wheezing, rhonchi or rales.  Abdominal:     General: Bowel sounds are normal. There is no distension.     Palpations: Abdomen is soft. There is no mass.     Tenderness: There is no abdominal tenderness. There is no guarding or rebound.     Hernia: No hernia is present.  Musculoskeletal:        General: No swelling, tenderness or deformity.     Cervical back: Normal range of motion and neck supple. No rigidity or tenderness.  Lymphadenopathy:     Head:     Right side of head: No submental, submandibular, tonsillar, preauricular, posterior auricular or occipital adenopathy.     Left side of head: No submental, submandibular, tonsillar, preauricular, posterior auricular or occipital adenopathy.  Cervical: No cervical adenopathy.     Right cervical: No superficial, deep or posterior cervical adenopathy.    Left cervical: No superficial, deep or posterior cervical adenopathy.     Upper Body:     Right upper body: No supraclavicular, axillary, pectoral or  epitrochlear adenopathy.     Left upper body: No supraclavicular, axillary, pectoral or epitrochlear adenopathy.  Skin:    General: Skin is warm.     Coloration: Skin is pale. Skin is not jaundiced.     Findings: No bruising, erythema, lesion or rash.  Neurological:     General: No focal deficit present.     Mental Status: She is alert and oriented to person, place, and time. Mental status is at baseline.     Cranial Nerves: No cranial nerve deficit.     Sensory: No sensory deficit.     Motor: No weakness.  Psychiatric:        Mood and Affect: Mood normal.        Behavior: Behavior normal.        Thought Content: Thought content normal.        Judgment: Judgment normal.     LABORATORY DATA: I have personally reviewed the data as listed:  Appointment on 07/30/2022  Component Date Value Ref Range Status   WBC Count 07/30/2022 12.8 (H)  4.0 - 10.5 K/uL Final   RBC 07/30/2022 4.23  3.87 - 5.11 MIL/uL Final   Hemoglobin 07/30/2022 12.7  12.0 - 15.0 g/dL Final   HCT 69/62/9528 38.8  36.0 - 46.0 % Final   MCV 07/30/2022 91.7  80.0 - 100.0 fL Final   MCH 07/30/2022 30.0  26.0 - 34.0 pg Final   MCHC 07/30/2022 32.7  30.0 - 36.0 g/dL Final   RDW 41/32/4401 14.0  11.5 - 15.5 % Final   Platelet Count 07/30/2022 304  150 - 400 K/uL Final   nRBC 07/30/2022 0.0  0.0 - 0.2 % Final   Neutrophils Relative % 07/30/2022 70  % Final   Neutro Abs 07/30/2022 9.1 (H)  1.7 - 7.7 K/uL Final   Lymphocytes Relative 07/30/2022 25  % Final   Lymphs Abs 07/30/2022 3.2  0.7 - 4.0 K/uL Final   Monocytes Relative 07/30/2022 3  % Final   Monocytes Absolute 07/30/2022 0.4  0.1 - 1.0 K/uL Final   Eosinophils Relative 07/30/2022 1  % Final   Eosinophils Absolute 07/30/2022 0.1  0.0 - 0.5 K/uL Final   Basophils Relative 07/30/2022 1  % Final   Basophils Absolute 07/30/2022 0.1  0.0 - 0.1 K/uL Final   Immature Granulocytes 07/30/2022 0  % Final   Abs Immature Granulocytes 07/30/2022 0.05  0.00 - 0.07 K/uL  Final   Performed at [facility], [address], [place], [place] [address]   Vitamin B-12 07/30/2022 197  180 - 914 pg/mL Final   Comment: (NOTE) This assay is not validated for testing neonatal or myeloproliferative syndrome specimens for Vitamin B12 levels. Performed at [facility], [address] [place], [place] [address]    Folate 07/30/2022 5.9 (L)  >5.9 ng/mL Final   Performed at [facility], [address] [place], [place] [address]   Ferritin 07/30/2022 11  11 - 307 ng/mL Final   Performed at [facility], [address], [place], [place] [address]    RADIOGRAPHIC STUDIES: I have personally reviewed the radiological images as listed and agree with the findings in the  report  No results found.  ASSESSMENT/PLAN  Patient is a year old  72 female with symptomatic microcytic anemia presumed to be secondary to chronic blood loss.    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    November 2023- Received 800 mg Venofer with improvement in Hgb to 12.3 January 29 2022- Referred to GI for consideration of endoscopy given patient's age.  Placed on Flintstone's MVI with iron May 14 2022:  EGD--  Localized inflammation characterized by congestion  (edema), erosions and erythema was found in the gastric antrum. Localized inflammation characterized by congestion (edema) and erythema was found in the duodenal bulb and in the second portion of the duodenum.  (This was due to overuse of Goody Powders) Colonoscopy revealed total of 8 polyps throughout the colon removed July 30 2022- Taking MVI with iron.  Ferritin 11.  Folate 5.9 Hgb 12.7.  Continue MVI with iron and add folic acid.    Dysfunctional uterine bleeding:  Has been seen by Gynecology for this.   November 06 2021- Exacerbated by antiplatelet effects of plavix and lexapro.  No evidence of coagulopathy as  judged by screening labs  April 30 2022- New Gyn referral because has not been seen by primary gynecologist  Jul 15 2022- TVUS thin endometrium  July 30 2022- Likely has atrophic endometrial lining.  Vaginal bleeding slowed by OCP's but not resolved  Tobacco use:  Discussed strategies for smoking cessation esp given Burger's disease  April 30 2022- Down to 4 cigarettes daily.    July 30 2022- Continues to smoke.  Wrote for Ct lung screening  Colon polyps  May 13 2020- Noted on colonoscopy  July 30 2022- Will need follow up colonoscopy in future    Cancer Staging  No matching staging information was found for the patient.   No problem-specific Assessment & Plan notes found for this encounter.   Orders Placed This Encounter  Procedures   CT CHEST LUNG CA SCREEN LOW DOSE W/O CM    Standing Status:   Future    Standing Expiration Date:   07/30/2023    Order Specific Question:   Preferred Imaging Location?    Answer:   [facility]    Order Specific Question:   Is patient pregnant?    Answer:   No   40  minutes was spent in patient care.  This included time spent preparing to see the patient (e.g., review of tests), obtaining and/or reviewing separately obtained history, counseling and educating the patient, ordering tests; documenting clinical information in the electronic or other health record, independently interpreting results and communicating results to the patient as well as coordination of care.      All questions were answered. The patient knows to call the clinic with any problems, questions or concerns.  This note was electronically signed.    Loni Muse, MD  07/31/2022 11:23 AM

## 2022-07-30 ENCOUNTER — Inpatient Hospital Stay (HOSPITAL_BASED_OUTPATIENT_CLINIC_OR_DEPARTMENT_OTHER): Payer: Managed Care, Other (non HMO) | Admitting: Oncology

## 2022-07-30 ENCOUNTER — Other Ambulatory Visit: Payer: Self-pay

## 2022-07-30 ENCOUNTER — Inpatient Hospital Stay: Payer: Managed Care, Other (non HMO) | Attending: Oncology

## 2022-07-30 VITALS — BP 121/79 | HR 96 | Temp 98.5°F | Resp 17 | Wt 158.5 lb

## 2022-07-30 DIAGNOSIS — D126 Benign neoplasm of colon, unspecified: Secondary | ICD-10-CM

## 2022-07-30 DIAGNOSIS — K259 Gastric ulcer, unspecified as acute or chronic, without hemorrhage or perforation: Secondary | ICD-10-CM

## 2022-07-30 DIAGNOSIS — T39395A Adverse effect of other nonsteroidal anti-inflammatory drugs [NSAID], initial encounter: Secondary | ICD-10-CM

## 2022-07-30 DIAGNOSIS — D539 Nutritional anemia, unspecified: Secondary | ICD-10-CM | POA: Diagnosis not present

## 2022-07-30 DIAGNOSIS — D500 Iron deficiency anemia secondary to blood loss (chronic): Secondary | ICD-10-CM

## 2022-07-30 DIAGNOSIS — Z79899 Other long term (current) drug therapy: Secondary | ICD-10-CM | POA: Diagnosis not present

## 2022-07-30 DIAGNOSIS — F17210 Nicotine dependence, cigarettes, uncomplicated: Secondary | ICD-10-CM | POA: Diagnosis not present

## 2022-07-30 DIAGNOSIS — N938 Other specified abnormal uterine and vaginal bleeding: Secondary | ICD-10-CM | POA: Insufficient documentation

## 2022-07-30 DIAGNOSIS — Z793 Long term (current) use of hormonal contraceptives: Secondary | ICD-10-CM | POA: Diagnosis not present

## 2022-07-30 DIAGNOSIS — M797 Fibromyalgia: Secondary | ICD-10-CM | POA: Diagnosis not present

## 2022-07-30 DIAGNOSIS — Z801 Family history of malignant neoplasm of trachea, bronchus and lung: Secondary | ICD-10-CM | POA: Diagnosis not present

## 2022-07-30 DIAGNOSIS — Z720 Tobacco use: Secondary | ICD-10-CM

## 2022-07-30 DIAGNOSIS — I7300 Raynaud's syndrome without gangrene: Secondary | ICD-10-CM | POA: Insufficient documentation

## 2022-07-30 DIAGNOSIS — D509 Iron deficiency anemia, unspecified: Secondary | ICD-10-CM | POA: Insufficient documentation

## 2022-07-30 DIAGNOSIS — E538 Deficiency of other specified B group vitamins: Secondary | ICD-10-CM

## 2022-07-30 DIAGNOSIS — Z7902 Long term (current) use of antithrombotics/antiplatelets: Secondary | ICD-10-CM | POA: Diagnosis not present

## 2022-07-30 LAB — CBC WITH DIFFERENTIAL (CANCER CENTER ONLY)
Abs Immature Granulocytes: 0.05 10*3/uL (ref 0.00–0.07)
Basophils Absolute: 0.1 10*3/uL (ref 0.0–0.1)
Basophils Relative: 1 %
Eosinophils Absolute: 0.1 10*3/uL (ref 0.0–0.5)
Eosinophils Relative: 1 %
HCT: 38.8 % (ref 36.0–46.0)
Hemoglobin: 12.7 g/dL (ref 12.0–15.0)
Immature Granulocytes: 0 %
Lymphocytes Relative: 25 %
Lymphs Abs: 3.2 10*3/uL (ref 0.7–4.0)
MCH: 30 pg (ref 26.0–34.0)
MCHC: 32.7 g/dL (ref 30.0–36.0)
MCV: 91.7 fL (ref 80.0–100.0)
Monocytes Absolute: 0.4 10*3/uL (ref 0.1–1.0)
Monocytes Relative: 3 %
Neutro Abs: 9.1 10*3/uL — ABNORMAL HIGH (ref 1.7–7.7)
Neutrophils Relative %: 70 %
Platelet Count: 304 10*3/uL (ref 150–400)
RBC: 4.23 MIL/uL (ref 3.87–5.11)
RDW: 14 % (ref 11.5–15.5)
WBC Count: 12.8 10*3/uL — ABNORMAL HIGH (ref 4.0–10.5)
nRBC: 0 % (ref 0.0–0.2)

## 2022-07-30 LAB — FOLATE: Folate: 5.9 ng/mL — ABNORMAL LOW (ref >5.9)

## 2022-07-30 LAB — VITAMIN B12: Vitamin B-12: 197 pg/mL (ref 180–914)

## 2022-07-30 LAB — FERRITIN: Ferritin: 11 ng/mL (ref 11–307)

## 2022-07-31 ENCOUNTER — Encounter: Payer: Self-pay | Admitting: Oncology

## 2022-07-31 DIAGNOSIS — E538 Deficiency of other specified B group vitamins: Secondary | ICD-10-CM | POA: Insufficient documentation

## 2022-07-31 DIAGNOSIS — K635 Polyp of colon: Secondary | ICD-10-CM | POA: Insufficient documentation

## 2022-07-31 DIAGNOSIS — K259 Gastric ulcer, unspecified as acute or chronic, without hemorrhage or perforation: Secondary | ICD-10-CM | POA: Insufficient documentation

## 2022-08-02 ENCOUNTER — Telehealth: Payer: Self-pay | Admitting: Oncology

## 2022-08-02 NOTE — Telephone Encounter (Signed)
Spoke with patient confirming upcoming appointment  

## 2022-08-03 ENCOUNTER — Telehealth: Payer: Self-pay

## 2022-08-03 NOTE — Telephone Encounter (Signed)
Patient called stating that the referral that Dr. Angelene Giovanni made for the pt for a CT Cancer Lung Screening the pt does not qualify for the screening per Radiology/Scheduler.  Pt stated she was told she does not qualify for the lung screening d/t to pt is NOT actively coughing up blood.  Pt was told that the provider will need to place a different order/referral for a screening the pt does qualify for.  Informed pt that Dr. Angelene Giovanni is currently out of the office but will be notified of the pt's call regarding the referral order.  Pt verbalized understanding and had no further questions or concerns.

## 2022-08-09 ENCOUNTER — Telehealth: Payer: Self-pay

## 2022-08-09 NOTE — Telephone Encounter (Addendum)
Called patient to relay message below as per Dr. Angelene Giovanni. Patient voiced full understanding.   ----- Message from Loni Muse, MD sent at 07/30/2022  4:12 PM EDT ----- Please have patient begin folic acid 1 mg daily Thanks ----- Message ----- From: Leory Plowman, [facility] [place] Sent: 07/30/2022   1:09 PM EDT To: Loni Muse, MD

## 2022-08-11 ENCOUNTER — Other Ambulatory Visit: Payer: Self-pay

## 2022-08-11 ENCOUNTER — Encounter: Payer: Self-pay | Admitting: Family Medicine

## 2022-08-11 ENCOUNTER — Ambulatory Visit: Payer: Managed Care, Other (non HMO) | Admitting: Family Medicine

## 2022-08-11 VITALS — BP 138/74 | HR 70 | Temp 98.2°F | Ht 66.0 in | Wt 159.4 lb

## 2022-08-11 DIAGNOSIS — R310 Gross hematuria: Secondary | ICD-10-CM

## 2022-08-11 DIAGNOSIS — N3001 Acute cystitis with hematuria: Secondary | ICD-10-CM | POA: Diagnosis not present

## 2022-08-11 LAB — URINALYSIS, ROUTINE W REFLEX MICROSCOPIC
Glucose, UA: NEGATIVE
Hyaline Cast: NONE SEEN /LPF
Nitrite: POSITIVE — AB
Specific Gravity, Urine: 1.023 (ref 1.001–1.035)
pH: 5.5 (ref 5.0–8.0)

## 2022-08-11 LAB — PREGNANCY, URINE: Preg Test, Ur: NEGATIVE

## 2022-08-11 LAB — MICROSCOPIC MESSAGE

## 2022-08-11 MED ORDER — SULFAMETHOXAZOLE-TRIMETHOPRIM 800-160 MG PO TABS: 1.0000 | ORAL_TABLET | Freq: Two times a day (BID) | ORAL | 0 refills | Status: DC

## 2022-08-11 NOTE — Assessment & Plan Note (Signed)
UA positive. Start Bactrim DS BID for 5 days. Return to office if symptoms persist or worsen.

## 2022-08-11 NOTE — Progress Notes (Signed)
Subjective:  HPI: Cassie Lynn is a 52 y.o. female presenting on 08/11/2022 for Follow-up (Urinating blood for over a week)   HPI Patient is in today for hematuria with clots and uterine cramping for 1 week. She denies dysuria, back pain, fever, chills, body aches. She does have irregular menstrual cycles.   Review of Systems  All other systems reviewed and are negative.   Relevant past medical history reviewed and updated as indicated.   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  Takes Depakote   Shortness of breath dyspnea      Past Surgical History:  Procedure Laterality Date   CERVICAL BIOPSY  W/ LOOP ELECTRODE EXCISION     CESAREAN SECTION     CHOLECYSTECTOMY     IR RADIOLOGY PERIPHERAL GUIDED IV START  10/13/2017   IR US GUIDE VASC ACCESS RIGHT  10/13/2017   LUMBAR LAMINECTOMY/DECOMPRESSION MICRODISCECTOMY Left 04/18/2015   Procedure: Left Lumbar five sacral-one  Microdiskectomy;  Surgeon: Loura Halt Ditty, MD;  Location: [facility];  Service: Neurosurgery;  Laterality: Left;    Allergies and medications reviewed and updated.   Current Outpatient Medications:    ALPRAZolam (XANAX) 0.5 MG tablet, Take 1 tablet (0.5 mg total) by mouth 3 (three) times daily as needed., Disp: 30 tablet, Rfl: 0   amLODipine (NORVASC) 10 MG tablet, Take by mouth., Disp: , Rfl:    clopidogrel (PLAVIX) 75 MG tablet, TAKE 1 TABLET BY MOUTH EVERY DAY, Disp: 90 tablet, Rfl: 3   cyclobenzaprine (FLEXERIL) 5 MG tablet, Take by mouth., Disp: , Rfl:    escitalopram (LEXAPRO) 10 MG tablet, TAKE 1 TABLET BY MOUTH EVERY DAY, Disp: 90 tablet, Rfl: 2   gabapentin (NEURONTIN) 300 MG capsule, TAKE 1 CAPSULE BY MOUTH THREE TIMES A DAY, Disp: 270 capsule, Rfl: 0   losartan (COZAAR) 100 MG tablet, TAKE 1/2 TABLET BY MOUTH EVERY DAY, Disp: 90  tablet, Rfl: 1   metoprolol tartrate (LOPRESSOR) 50 MG tablet, Take 25 mg by mouth daily., Disp: , Rfl:    norethindrone (ORTHO MICRONOR) 0.35 MG tablet, Take 1 tablet (0.35 mg total) by mouth daily., Disp: 84 tablet, Rfl: 1   pantoprazole (PROTONIX) 40 MG tablet, Take 1 tablet (40 mg total) by mouth daily., Disp: 90 tablet, Rfl: 3   rosuvastatin (CRESTOR) 20 MG tablet, Take 1 tablet (20 mg total) by mouth daily., Disp: 90 tablet, Rfl: 1   sulfamethoxazole-trimethoprim (BACTRIM DS) 800-160 MG tablet, Take 1 tablet by mouth 2 (two) times daily., Disp: 10 tablet, Rfl: 0   SUMAtriptan (IMITREX) 100 MG tablet, sumatriptan 100 mg tablet, Disp: , Rfl:    topiramate (TOPAMAX) 50 MG tablet, TAKE 1 TABLET BY MOUTH DAILY AND 2 TABLETS EVERY NIGHT, Disp: 90 tablet, Rfl: 2  Allergies  Allergen Reactions   Vicodin [Hydrocodone-Acetaminophen] Other (See Comments)    Makes her feel like she cannot breath, but no throat swelling. No issues with plain Tylenol    Objective:   BP 138/74   Pulse 70   Temp 98.2 F (36.8 C) (Oral)   Ht 5\' 6"  (1.676 m)   Wt 159 lb 6.4 oz (72.3 kg)   LMP 06/28/2022   SpO2 98%   BMI 25.73 kg/m      08/11/2022  2:17 PM 07/30/2022    1:35 PM 07/28/2022    2:33 PM  Vitals with BMI  Height 5\' 6"   5\' 6"   Weight 159 lbs 6 oz 158 lbs 8 oz 160 lbs 3 oz  BMI 25.74 25.59 25.87  Systolic 138 121 413  Diastolic 74 79 64  Pulse 70 96 76     Physical Exam Vitals and nursing note reviewed. Exam conducted with a chaperone present.  Constitutional:      Appearance: Normal appearance. She is normal weight.  HENT:     Head: Normocephalic and atraumatic.  Genitourinary:    General: Normal vulva.     Vagina: Normal.  Skin:    General: Skin is warm and dry.  Neurological:     General: No focal deficit present.     Mental Status: She is alert and oriented to person, place, and time. Mental status is at baseline.  Psychiatric:        Mood and Affect: Mood normal.         Behavior: Behavior normal.        Thought Content: Thought content normal.        Judgment: Judgment normal.     Assessment & Plan:  Acute cystitis with hematuria Assessment & Plan: UA positive. Start Bactrim DS BID for 5 days. Return to office if symptoms persist or worsen.  Orders: -     Urinalysis, Routine w reflex microscopic; Future -     Urine Culture; Future  Gross hematuria -     Urinalysis, Routine w reflex microscopic; Future -     Urine Culture; Future  Other orders -     Sulfamethoxazole-Trimethoprim; Take 1 tablet by mouth 2 (two) times daily.  Dispense: 10 tablet; Refill: 0     Follow up plan: Return if symptoms worsen or fail to improve.  Park Meo, FNP

## 2022-08-14 LAB — URINE CULTURE
MICRO NUMBER:: 15107054
SPECIMEN QUALITY:: ADEQUATE

## 2022-10-28 NOTE — Progress Notes (Signed)
[facility] Cancer Follow up Visit:  Patient Care Team: Donita Brooks, MD as PCP - General (Family Medicine) Loni Muse, MD as Consulting Physician (Hematology)  CHIEF COMPLAINTS/PURPOSE OF CONSULTATION:  Evaluation of anemia  HISTORY OF PRESENTING ILLNESS: Cassie Lynn 52 y.o. female is here because of  anemia Medical history notable for brain aneurysm, Buerger's disease, peripheral vascular disease, fibromyalgia August 06, 2021: CMP notable for glucose 126 October 13, 2021: WBC 11.2 hemoglobin 9.3 MCV 78 platelet count 290; 55 segs 35 lymphs 6 monos 3 eos 1 basophil.  Ferritin 6  November 06 2021:  [facility] Hematology Consult  She states that she has been anemic for quite awhile  Patient is G6  P3033 last pregnancy was 26 yrs ago.  Menopause not reached.  In the past menses occurred regularly but in the last few months have been intermittent.  Bleeding lasts 3 to 7 days and is heavy with passages of clots.  Last year underwent a D&C to manage heavy bleeding.  Last pregnancy was delivered by NSVD.  Has taken oral iron which she can tolerate but states that it her Hgb just doesn't go up.  Has never received IV iron nor required PRBC's in the past.  Has a regular diet.  No history/ history of postpartum hemorrhage requiring transfusion.   No history hemorrhage postoperatively.  No hematochezia, melena, hemoptysis, hematuria.  No history of intra-articular or soft tissue bleeding No history of abnormal bleeding in family members Patient has symptoms of fatigue, pallor,  DOE, decreased performance status.  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    Underwent EGD in the past but has not had a colonoscopy. Has tried OCP's but states that did not help.    Social:  Began smoking at 67 yoa, smokes 1/2 ppd now but used to smoke 2 1/2 ppd.  EtOH occasionally  [facility] Mother died 30 mesothelioma worked in [employer] Father alive 43 well Sister alive 71 bipolar  disorder, cerebral palsy  WBC 12.0 hemoglobin 10.2 MCV 79 platelet count 351; 64 segs 29 lymphs 4 monos 1 EO.  Reticulocytes 1.2%  ferritin 5 B12 257 folate 9.1 Haptoglobin 245 Coombs test negative Fibrinogen 346 Factor VIII 153 von Willebrand factor antigen 124 activity 82   December 18 2021:  Scheduled follow up for management of anemia.  Reviewed results of labs with patient.  Has not yet received IV iron  December 29, 2021 Venofer 400 mg January 05, 2022 Venofer 400 mg  January 29 2022:  Scheduled follow up for management of anemia  Feels better since receiving IV iron.  Has not yet been seen by GI.  Smoking less than 1/2 ppd cigarettes a day.  Applauded efforts at smoking cessation.  Encouraged further steps toward abstaining.  Patient to start Flintsone's MVI with iron daily.   Reviewed labs.  Hemoglobin 12.3 today   March 11, 2022: GI consult.  Plan for EGD and colonoscopy to rule out GI etiology of iron deficiency anemia.  In setting of weight loss.  April 30, 2022:  Has not been able to make an appointment with her usual Gynecologist.  Will refer to a different Gynecologist.  Down to 4 cigarettes a day.  Taking Flintstone's MVI with iron daily and tolerating it well.  No weight change since last visit Hgb 13.6  May 14 2022:  EGD--  Localized inflammation characterized by congestion  (edema), erosions and erythema was found in the gastric antrum. Localized  inflammation characterized by congestion (edema) and erythema was found in the duodenal bulb and in the second portion of the duodenum.  Colonoscopy revealed total of 8 polyps throughout the colon removed  Jul 15 2022:  Transvaginal U/S Anteverted uterus, normal size and shape, no myometrial masses.  Thin, symmetrical endometrium - 3.5 mm.  No masses or thickening seen.   Right ovary small with no visible follicles.  Left ovary with a 22 x 20 mm simple follicle. No adnexal masses, no free fluid.  July 30 2022:      Appetite  diminished.   Taking a MVI with iron which she is tolerating well.  Smoking 4 cigarettes daily.   Patient states that she was taking goody powders prior to endoscopy.  No longer doing so.  Has been placed on OCP's by PCP.  Vaginal bleeding has slowed but not abated.     WBC 12.8 hemoglobin 12.7 MCV 92 platelet count 304; 70 segs 25 lymphs 3 monos 1 EO 1 basophil Ferritin 11 folate 5.9 B12 197  October 29, 2022:  Scheduled follow-up for management of anemia.  Smokes a cigarette every few days.  Vaginal bleeding is intermittent.  Had a bout of left flank pain and gross hematuria.  Was seen by PCP but has not undergone a renal U/S.  Currently without flank pain or hematuria.  Takes Flintstone's MVI with iron and folic acid daily.  Hgb improved.    Possibly has nephrolithiasis.     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  Takes Depakote   Shortness of breath dyspnea     SURGICAL HISTORY: Past Surgical History:  Procedure Laterality Date   CERVICAL BIOPSY  W/ LOOP ELECTRODE EXCISION     CESAREAN SECTION     CHOLECYSTECTOMY     IR RADIOLOGY PERIPHERAL GUIDED IV START  10/13/2017   IR US GUIDE VASC ACCESS RIGHT  10/13/2017   LUMBAR LAMINECTOMY/DECOMPRESSION MICRODISCECTOMY Left 04/18/2015   Procedure: Left Lumbar five sacral-one  Microdiskectomy;  Surgeon: Loura Halt Ditty, MD;  Location: [facility];  Service: Neurosurgery;  Laterality: Left;    SOCIAL HISTORY: Social History   Socioeconomic History   Marital status: Single    Spouse name: Not on file   Number of children: Not on file   Years of education: Not on file   Highest education level: Not on file  Occupational History   Not on file  Tobacco Use   Smoking status: Every Day    Types: Cigarettes   Smokeless  tobacco: Never   Tobacco comments:    1 pack lasts 3 days.  Vaping Use   Vaping status: Never Used  Substance and Sexual Activity   Alcohol use: Yes    Comment: occ    Drug use: No   Sexual activity: Not Currently    Partners: Male  Other Topics Concern   Not on file  Social History Narrative   Leave with boyfriend   1-story home 2 steps at the front   1-cup a day   Social Determinants of Health   Financial Resource Strain: Not on file  Food Insecurity: Not on file  Transportation Needs: Not on file  Physical Activity: Not on file  Stress: Not  on file  Social Connections: Not on file  Intimate Partner Violence: Not on file    FAMILY HISTORY Family History  Problem Relation Age of Onset   Hypertension Mother    Lung cancer Mother    Hypertension Father    Hypertension Sister    Bipolar disorder Sister    Cerebral palsy Sister    Breast cancer Neg Hx    Ovarian cancer Neg Hx     ALLERGIES:  is allergic to vicodin [hydrocodone-acetaminophen].  MEDICATIONS:  Current Outpatient Medications  Medication Sig Dispense Refill   ALPRAZolam (XANAX) 0.5 MG tablet Take 1 tablet (0.5 mg total) by mouth 3 (three) times daily as needed. 30 tablet 0   amLODipine (NORVASC) 10 MG tablet Take by mouth.     clopidogrel (PLAVIX) 75 MG tablet TAKE 1 TABLET BY MOUTH EVERY DAY 90 tablet 3   cyclobenzaprine (FLEXERIL) 5 MG tablet Take by mouth.     escitalopram (LEXAPRO) 10 MG tablet TAKE 1 TABLET BY MOUTH EVERY DAY 90 tablet 2   gabapentin (NEURONTIN) 300 MG capsule TAKE 1 CAPSULE BY MOUTH THREE TIMES A DAY 270 capsule 0   losartan (COZAAR) 100 MG tablet TAKE 1/2 TABLET BY MOUTH EVERY DAY 90 tablet 1   metoprolol tartrate (LOPRESSOR) 50 MG tablet Take 25 mg by mouth daily.     norethindrone (ORTHO MICRONOR) 0.35 MG tablet Take 1 tablet (0.35 mg total) by mouth daily. 84 tablet 1   pantoprazole (PROTONIX) 40 MG tablet Take 1 tablet (40 mg total) by mouth daily. 90 tablet 3    rosuvastatin (CRESTOR) 20 MG tablet Take 1 tablet (20 mg total) by mouth daily. 90 tablet 1   sulfamethoxazole-trimethoprim (BACTRIM DS) 800-160 MG tablet Take 1 tablet by mouth 2 (two) times daily. 10 tablet 0   SUMAtriptan (IMITREX) 100 MG tablet sumatriptan 100 mg tablet     topiramate (TOPAMAX) 50 MG tablet TAKE 1 TABLET BY MOUTH DAILY AND 2 TABLETS EVERY NIGHT 90 tablet 2   No current facility-administered medications for this visit.    PHYSICAL EXAMINATION:  ECOG PERFORMANCE STATUS: 1 - Symptomatic but completely ambulatory   There were no vitals filed for this visit.   There were no vitals filed for this visit.    Physical Exam Vitals and nursing note reviewed.  Constitutional:      Appearance: Normal appearance. She is not toxic-appearing or diaphoretic.     Comments: Here alone  HENT:     Head: Normocephalic and atraumatic.     Right Ear: External ear normal.     Left Ear: External ear normal.     Nose: Nose normal. No congestion or rhinorrhea.  Eyes:     General: No scleral icterus.    Extraocular Movements: Extraocular movements intact.     Conjunctiva/sclera: Conjunctivae normal.     Pupils: Pupils are equal, round, and reactive to light.  Cardiovascular:     Rate and Rhythm: Normal rate and regular rhythm.     Pulses: Normal pulses.     Heart sounds: Normal heart sounds. No murmur heard.    No friction rub. No gallop.  Pulmonary:     Effort: Pulmonary effort is normal. No respiratory distress.     Breath sounds: Normal breath sounds. No stridor. No wheezing, rhonchi or rales.  Abdominal:     General: Bowel sounds are normal. There is no distension.     Palpations: Abdomen is soft. There is no mass.     Tenderness:  There is no abdominal tenderness. There is no guarding or rebound.     Hernia: No hernia is present.  Musculoskeletal:        General: No swelling, tenderness or deformity.     Cervical back: Normal range of motion and neck supple. No rigidity  or tenderness.  Lymphadenopathy:     Head:     Right side of head: No submental, submandibular, tonsillar, preauricular, posterior auricular or occipital adenopathy.     Left side of head: No submental, submandibular, tonsillar, preauricular, posterior auricular or occipital adenopathy.     Cervical: No cervical adenopathy.     Right cervical: No superficial, deep or posterior cervical adenopathy.    Left cervical: No superficial, deep or posterior cervical adenopathy.     Upper Body:     Right upper body: No supraclavicular, axillary, pectoral or epitrochlear adenopathy.     Left upper body: No supraclavicular, axillary, pectoral or epitrochlear adenopathy.  Skin:    General: Skin is warm.     Coloration: Skin is pale. Skin is not jaundiced.     Findings: No bruising, erythema, lesion or rash.  Neurological:     General: No focal deficit present.     Mental Status: She is alert and oriented to person, place, and time. Mental status is at baseline.     Cranial Nerves: No cranial nerve deficit.     Sensory: No sensory deficit.     Motor: No weakness.  Psychiatric:        Mood and Affect: Mood normal.        Behavior: Behavior normal.        Thought Content: Thought content normal.        Judgment: Judgment normal.     LABORATORY DATA: I have personally reviewed the data as listed:  No visits with results within 1 Month(s) from this visit.  Latest known visit with results is:  Orders Only on 08/11/2022  Component Date Value Ref Range Status   MICRO NUMBER: 08/11/2022 [id number]   Final   SPECIMEN QUALITY: 08/11/2022 Adequate   Final   Sample Source 08/11/2022 URINE   Final   STATUS: 08/11/2022 FINAL   Final   ISOLATE 1: 08/11/2022 Escherichia coli (A)   Final   Greater than 100,000 CFU/mL of Escherichia coli   Color, Urine 08/11/2022 YELLOW  YELLOW Final   APPearance 08/11/2022 CLOUDY (A)  CLEAR Final   Specific Gravity, Urine 08/11/2022 1.023  1.001 - 1.035 Final   Comment:  Verified by repeat analysis. .    pH 08/11/2022 5.5  5.0 - 8.0 Final   Glucose, UA 08/11/2022 NEGATIVE  NEGATIVE Final   Bilirubin Urine 08/11/2022 1+ (A)  NEGATIVE Final   Comment: Presumptive positive bilirubin. Consider confirmation  by serum bilirubin if clinically indicated.    Ketones, ur 08/11/2022 TRACE (A)  NEGATIVE Final   Hgb urine dipstick 08/11/2022 3+ (A)  NEGATIVE Final   Protein, ur 08/11/2022 3+ (A)  NEGATIVE Final   Nitrite 08/11/2022 POSITIVE (A)  NEGATIVE Final   Leukocytes,Ua 08/11/2022 TRACE (A)  NEGATIVE Final   WBC, UA 08/11/2022 20-40 (A)  0 - 5 /HPF Final   RBC / HPF 08/11/2022 3-10 (A)  0 - 2 /HPF Final   Squamous Epithelial / HPF 08/11/2022 0-5  < OR = 5 /HPF Final   Bacteria, UA 08/11/2022 MODERATE (A)  NONE SEEN /HPF Final   Hyaline Cast 08/11/2022 NONE SEEN  NONE SEEN /LPF Final   Note  08/11/2022    Final   Comment: This urine was analyzed for the presence of WBC,  RBC, bacteria, casts, and other formed elements.  Only those elements seen were reported. . .     RADIOGRAPHIC STUDIES: I have personally reviewed the radiological images as listed and agree with the findings in the report  No results found.  ASSESSMENT/PLAN  Patient is a year old  69 female with symptomatic microcytic anemia presumed to be secondary to chronic blood loss.    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    November 2023- Received 800 mg Venofer with improvement in Hgb to 12.3 January 29 2022- Referred to GI for consideration of endoscopy given patient's age.  Placed on Flintstone's MVI with iron May 14 2022:  EGD--  Localized inflammation characterized by congestion  (edema), erosions and erythema was found in the gastric antrum. Localized inflammation characterized by congestion (edema) and erythema was found in the duodenal bulb and in the second portion of the duodenum.  (This was due to overuse of Goody  Powders) Colonoscopy revealed total of 8 polyps throughout the colon removed July 30 2022- Taking MVI with iron.  Ferritin 11.  Folate 5.9 Hgb 12.7.  Continue MVI with iron and add folic acid.   October 29 2022- Taking MVI with iron.  Hgb 13.0 Ferritin 15.  Folate 8.9 Continue current therapy  Dysfunctional uterine bleeding:  Has been seen by Gynecology for this.   November 06 2021- Exacerbated by antiplatelet effects of plavix and lexapro.  No evidence of coagulopathy as judged by screening labs  April 30 2022- New Gyn referral because has not been seen by primary gynecologist  Jul 15 2022- TVUS thin endometrium  July 30 2022- Likely has atrophic endometrial lining.  Vaginal bleeding slowed by OCP's but not resolved  October 29 2022- Having intermittent vaginal bleeding.    Tobacco use:  Discussed strategies for smoking cessation esp given Burger's disease  April 30 2022- Down to 4 cigarettes daily.    July 30 2022- Continues to smoke.  Wrote for CT lung screening  October 29 2022- Down to a cigarette every few days  Colon polyps  May 13 2020- Noted on colonoscopy  July 30 2022- Will need follow up colonoscopy in future  Intermittent flank pain and hematuria  October 29 2022- Raises the question of nephrolithiasis.  Has been seen by PCP    Cancer Staging  No matching staging information was found for the patient.    No problem-specific Assessment & Plan notes found for this encounter.   No orders of the defined types were placed in this encounter.  30  minutes was spent in patient care.  This included time spent preparing to see the patient (e.g., review of tests), obtaining and/or reviewing separately obtained history, counseling and educating the patient, ordering tests; documenting clinical information in the electronic or other health record, independently interpreting results and communicating results to the patient as well as coordination of care.      All questions were  answered. The patient knows to call the clinic with any problems, questions or concerns.  This note was electronically signed.    Loni Muse, MD  10/28/2022 4:14 PM

## 2022-10-29 ENCOUNTER — Inpatient Hospital Stay: Payer: Managed Care, Other (non HMO) | Attending: Oncology | Admitting: Oncology

## 2022-10-29 ENCOUNTER — Encounter: Payer: Self-pay | Admitting: Oncology

## 2022-10-29 ENCOUNTER — Inpatient Hospital Stay (HOSPITAL_BASED_OUTPATIENT_CLINIC_OR_DEPARTMENT_OTHER): Payer: Managed Care, Other (non HMO) | Attending: Oncology

## 2022-10-29 VITALS — BP 139/71 | HR 53 | Temp 98.1°F | Resp 18 | Wt 161.2 lb

## 2022-10-29 DIAGNOSIS — Z801 Family history of malignant neoplasm of trachea, bronchus and lung: Secondary | ICD-10-CM | POA: Insufficient documentation

## 2022-10-29 DIAGNOSIS — F17210 Nicotine dependence, cigarettes, uncomplicated: Secondary | ICD-10-CM | POA: Diagnosis not present

## 2022-10-29 DIAGNOSIS — E538 Deficiency of other specified B group vitamins: Secondary | ICD-10-CM

## 2022-10-29 DIAGNOSIS — Z79899 Other long term (current) drug therapy: Secondary | ICD-10-CM | POA: Diagnosis not present

## 2022-10-29 DIAGNOSIS — N938 Other specified abnormal uterine and vaginal bleeding: Secondary | ICD-10-CM | POA: Diagnosis present

## 2022-10-29 DIAGNOSIS — I731 Thromboangiitis obliterans [Buerger's disease]: Secondary | ICD-10-CM | POA: Diagnosis not present

## 2022-10-29 DIAGNOSIS — D539 Nutritional anemia, unspecified: Secondary | ICD-10-CM | POA: Diagnosis not present

## 2022-10-29 DIAGNOSIS — D509 Iron deficiency anemia, unspecified: Secondary | ICD-10-CM | POA: Insufficient documentation

## 2022-10-29 LAB — CBC WITH DIFFERENTIAL (CANCER CENTER ONLY)
Abs Immature Granulocytes: 0.03 10*3/uL (ref 0.00–0.07)
Basophils Absolute: 0.1 10*3/uL (ref 0.0–0.1)
Basophils Relative: 1 %
Eosinophils Absolute: 0 10*3/uL (ref 0.0–0.5)
Eosinophils Relative: 0 %
HCT: 39.1 % (ref 36.0–46.0)
Hemoglobin: 13 g/dL (ref 12.0–15.0)
Immature Granulocytes: 0 %
Lymphocytes Relative: 13 %
Lymphs Abs: 1.6 10*3/uL (ref 0.7–4.0)
MCH: 31 pg (ref 26.0–34.0)
MCHC: 33.2 g/dL (ref 30.0–36.0)
MCV: 93.3 fL (ref 80.0–100.0)
Monocytes Absolute: 0.2 10*3/uL (ref 0.1–1.0)
Monocytes Relative: 2 %
Neutro Abs: 9.9 10*3/uL — ABNORMAL HIGH (ref 1.7–7.7)
Neutrophils Relative %: 84 %
Platelet Count: 236 10*3/uL (ref 150–400)
RBC: 4.19 MIL/uL (ref 3.87–5.11)
RDW: 13.8 % (ref 11.5–15.5)
WBC Count: 11.7 10*3/uL — ABNORMAL HIGH (ref 4.0–10.5)
nRBC: 0 % (ref 0.0–0.2)

## 2022-10-29 LAB — VITAMIN B12: Vitamin B-12: 253 pg/mL (ref 180–914)

## 2022-10-29 LAB — FERRITIN: Ferritin: 15 ng/mL (ref 11–307)

## 2022-10-29 LAB — FOLATE: Folate: 8.9 ng/mL (ref >5.9)

## 2022-11-02 ENCOUNTER — Other Ambulatory Visit: Payer: Self-pay | Admitting: Oncology

## 2022-11-02 DIAGNOSIS — D500 Iron deficiency anemia secondary to blood loss (chronic): Secondary | ICD-10-CM

## 2022-11-18 ENCOUNTER — Encounter: Payer: Self-pay | Admitting: Oncology

## 2023-03-28 ENCOUNTER — Telehealth: Payer: Self-pay

## 2023-03-28 NOTE — Telephone Encounter (Signed)
Scheduled appointments per 2/1 scheduling message. Patients voicemail was full. Patient is active on MyChart and will be mailed an appointment reminder.

## 2023-04-08 NOTE — Progress Notes (Unsigned)
 Patient Care Team: Donita Brooks, MD as PCP - General (Family Medicine) Loni Muse, MD (Inactive) as Consulting Physician (Hematology)  Clinic Day:  04/11/2023  Referring physician: Donita Brooks, MD  ASSESSMENT & PLAN:   Assessment & Plan: 53 y.o.female with history of anemia, migraine headaches here for follow up for history of IDA.  Previous lab showed iron deficiency and low b12. Last received iv iron in 12/2021. Previously with history of heavy menstrual bleeding.   Last colonoscopy was in 2023 and showed polyps.  EGD showed erythema/erosions.  Pathology showed duodenitis.  One new undiagnosed new problem of hematuria.  Clinically reports new persistent hematuria with blood clots.  Recommend referral for evaluation by urologist.  Iron deficiency anemia due to chronic blood loss Assessment & Plan: Repeat ferritin and cbc today Order iv iron as indicated  Orders: -     CBC with Differential (Cancer Center Only); Future -     Ferritin; Future  Folate deficiency Assessment & Plan: Repeat folate level today Continue folic acid  Orders: -     CBC with Differential (Cancer Center Only); Future -     Folate; Future  Low vitamin B12 level Assessment & Plan: Repeat B12 and MMA  Orders: -     CBC with Differential (Cancer Center Only); Future -     Vitamin B12; Future -     Methylmalonic acid, serum; Future  Gross hematuria -     CBC with Differential (Cancer Center Only); Future -     CMP (Cancer Center only); Future -     Ferritin; Future -     Urinalysis, Complete w Microscopic; Future -     Ambulatory referral to Urology  Follow-up pending above testing.  The patient understands the plans discussed today and is in agreement with them.  She knows to contact our office if she develops concerns prior to her next appointment.  Melven Sartorius, MD  [facility] - A DEPT OF [facility] [address] [place] [address] Dept: [phone] Dept Fax: [phone]   Orders Placed This Encounter  Procedures   CBC with Differential (Cancer Center Only)    Standing Status:   Future    Expiration Date:   04/10/2024   CMP (Cancer Center only)    Standing Status:   Future    Expiration Date:   04/10/2024   Folate    Standing Status:   Future    Expiration Date:   04/10/2024   Vitamin B12    Standing Status:   Future    Expiration Date:   04/10/2024   Ferritin    Standing Status:   Future    Expiration Date:   04/10/2024   Methylmalonic acid, serum    Standing Status:   Future    Expiration Date:   05/09/2023   Urinalysis, Complete w Microscopic    Standing Status:   Future    Expiration Date:   04/10/2024   Ambulatory referral to Urology    Referral Priority:   Urgent    Referral Type:   Consultation    Referral Reason:   Specialty Services Required    Referred to Provider:   Loletta Parish., MD    Requested Specialty:   Urology    Number of Visits Requested:   1      CHIEF COMPLAINT:  CC: Iron deficiency  INTERVAL HISTORY:  Cassie Lynn is here today for follow up. She is feeling alright. No bloody stool, dark stool but has blood clot coming out of urine. She saw her PCP and reports intermittent for a few month. No back pain.  It feels her uterus contraction. She is sure not vaginal bleeding. It occurs when urinating. She has not seen Urologist. Last time 2-3 weeks. No stomach pain, nausea, vomiting. No dysuria. No loss of appetite, or weight loss.  She has irregular periods but pretty sure not related to vaginal bleeding.   Report she tolerated iv iron well. Ice craving resolved.  She takes children's version vitamin with extra iron.  05/14/2022:  EGD--  Localized inflammation characterized by congestion  (edema), erosions and erythema was found in the gastric antrum. Localized inflammation characterized by congestion (edema) and erythema was found in the  duodenal bulb and in the second portion of the duodenum.  Colonoscopy revealed total of 8 polyps throughout the colon removed  Past Medical History:  Diagnosis Date   Anemia    Aneurysm (HCC)    brain- 2015ish was gone   Buerger's disease (HCC)    pvd with Raynaud's phenomenon   Fibromyalgia    Migraine    Migraines.  Takes Depakote   Shortness of breath dyspnea     ALLERGIES:  is allergic to vicodin [hydrocodone-acetaminophen].  MEDICATIONS:  Current Outpatient Medications  Medication Sig Dispense Refill   ALPRAZolam (XANAX) 0.5 MG tablet Take 1 tablet (0.5 mg total) by mouth 3 (three) times daily as needed. 30 tablet 0   amLODipine (NORVASC) 10 MG tablet Take by mouth.     escitalopram (LEXAPRO) 10 MG tablet TAKE 1 TABLET BY MOUTH EVERY DAY 90 tablet 2   gabapentin (NEURONTIN) 300 MG capsule TAKE 1 CAPSULE BY MOUTH THREE TIMES A DAY 270 capsule 0   losartan (COZAAR) 100 MG tablet TAKE 1/2 TABLET BY MOUTH EVERY DAY 90 tablet 1   norethindrone (ORTHO MICRONOR) 0.35 MG tablet Take 1 tablet (0.35 mg total) by mouth daily. 84 tablet 1   pantoprazole (PROTONIX) 40 MG tablet Take 1 tablet (40 mg total) by mouth daily. 90 tablet 3   rosuvastatin (CRESTOR) 20 MG tablet Take 1 tablet (20 mg total) by mouth daily. 90 tablet 1   topiramate (TOPAMAX) 50 MG tablet TAKE 1 TABLET BY MOUTH DAILY AND 2 TABLETS EVERY NIGHT 90 tablet 2   No current facility-administered medications for this visit.    HISTORY OF PRESENT ILLNESS:   Past Medical History:  Diagnosis Date   Anemia    Aneurysm (HCC)    brain- 2015ish was gone   Buerger's disease (HCC)    pvd with Raynaud's phenomenon   Fibromyalgia    Migraine    Migraines.  Takes Depakote   Shortness of breath dyspnea     REVIEW OF SYSTEMS:   All relevant systems were reviewed with the patient and are negative.   VITALS:  Blood pressure 134/67, pulse 64, temperature 97.8 F (36.6 C), resp. rate 16, weight 166 lb 3.2 oz (75.4 kg),  SpO2 100%.  Wt Readings from Last 3 Encounters:  04/11/23 166 lb 3.2 oz (75.4 kg)  10/29/22 161 lb 3.2 oz (73.1 kg)  08/11/22 159 lb 6.4 oz (72.3 kg)    Body mass index is 26.83 kg/m.  Performance status (ECOG): 1 - Symptomatic but completely ambulatory  PHYSICAL EXAM:   GENERAL: alert, no distress and comfortable SKIN: skin color normal, no jaundice OROPHARYNX: Moist NECK: supple, no palpable  mass LYMPH:  no palpable cervical lymphadenopathy LUNGS: clear to auscultation with normal breathing effort.  No wheeze or Rales HEART: regular rate & rhythm and no lower extremity edema ABDOMEN: abdomen soft, non-tender and nondistended  LABORATORY DATA:  I have reviewed the data as listed    Component Value Date/Time   NA 140 08/06/2021 1431   K 3.9 08/06/2021 1431   CL 109 08/06/2021 1431   CO2 24 08/06/2021 1431   GLUCOSE 126 (H) 08/06/2021 1431   BUN 10 08/06/2021 1431   CREATININE 0.82 08/06/2021 1431   CALCIUM 9.1 08/06/2021 1431   PROT 6.5 08/06/2021 1431   ALBUMIN 4.2 09/16/2016 1207   AST 13 08/06/2021 1431   ALT 10 08/06/2021 1431   ALKPHOS 45 09/16/2016 1207   BILITOT 0.4 08/06/2021 1431   GFRNONAA 95 02/28/2020 1631   GFRAA 110 02/28/2020 1631    No results found for: "SPEP", "UPEP"  Lab Results  Component Value Date   WBC 11.7 (H) 10/29/2022   NEUTROABS 9.9 (H) 10/29/2022   HGB 13.0 10/29/2022   HCT 39.1 10/29/2022   MCV 93.3 10/29/2022   PLT 236 10/29/2022      Chemistry      Component Value Date/Time   NA 140 08/06/2021 1431   K 3.9 08/06/2021 1431   CL 109 08/06/2021 1431   CO2 24 08/06/2021 1431   BUN 10 08/06/2021 1431   CREATININE 0.82 08/06/2021 1431      Component Value Date/Time   CALCIUM 9.1 08/06/2021 1431   ALKPHOS 45 09/16/2016 1207   AST 13 08/06/2021 1431   ALT 10 08/06/2021 1431   BILITOT 0.4 08/06/2021 1431       RADIOGRAPHIC STUDIES: I have personally reviewed the radiological images as listed and agreed with the  findings in the report. No results found.

## 2023-04-09 DIAGNOSIS — R7989 Other specified abnormal findings of blood chemistry: Secondary | ICD-10-CM | POA: Insufficient documentation

## 2023-04-09 DIAGNOSIS — D509 Iron deficiency anemia, unspecified: Secondary | ICD-10-CM | POA: Insufficient documentation

## 2023-04-09 NOTE — Assessment & Plan Note (Signed)
 Repeat folate level today Continue folic acid

## 2023-04-09 NOTE — Assessment & Plan Note (Signed)
 Repeat ferritin and cbc today Order iv iron as indicated

## 2023-04-09 NOTE — Assessment & Plan Note (Signed)
 Repeat B12 and MMA

## 2023-04-11 ENCOUNTER — Encounter: Payer: Self-pay | Admitting: Oncology

## 2023-04-11 ENCOUNTER — Inpatient Hospital Stay: Payer: Managed Care, Other (non HMO)

## 2023-04-11 ENCOUNTER — Telehealth: Payer: Self-pay | Admitting: *Deleted

## 2023-04-11 VITALS — BP 134/67 | HR 64 | Temp 97.8°F | Resp 16 | Wt 166.2 lb

## 2023-04-11 DIAGNOSIS — E538 Deficiency of other specified B group vitamins: Secondary | ICD-10-CM | POA: Insufficient documentation

## 2023-04-11 DIAGNOSIS — D500 Iron deficiency anemia secondary to blood loss (chronic): Secondary | ICD-10-CM | POA: Insufficient documentation

## 2023-04-11 DIAGNOSIS — R7989 Other specified abnormal findings of blood chemistry: Secondary | ICD-10-CM

## 2023-04-11 DIAGNOSIS — R310 Gross hematuria: Secondary | ICD-10-CM | POA: Diagnosis not present

## 2023-04-11 DIAGNOSIS — D529 Folate deficiency anemia, unspecified: Secondary | ICD-10-CM | POA: Diagnosis not present

## 2023-04-11 DIAGNOSIS — Z79899 Other long term (current) drug therapy: Secondary | ICD-10-CM | POA: Insufficient documentation

## 2023-04-11 LAB — URINALYSIS, COMPLETE (UACMP) WITH MICROSCOPIC
Bacteria, UA: NONE SEEN
Bilirubin Urine: NEGATIVE
Glucose, UA: NEGATIVE mg/dL
Ketones, ur: NEGATIVE mg/dL
Leukocytes,Ua: NEGATIVE
Nitrite: NEGATIVE
Protein, ur: NEGATIVE mg/dL
Specific Gravity, Urine: 1.023 (ref 1.005–1.030)
pH: 5 (ref 5.0–8.0)

## 2023-04-11 LAB — CBC WITH DIFFERENTIAL (CANCER CENTER ONLY)
Abs Immature Granulocytes: 0.05 10*3/uL (ref 0.00–0.07)
Basophils Absolute: 0.1 10*3/uL (ref 0.0–0.1)
Basophils Relative: 1 %
Eosinophils Absolute: 0.3 10*3/uL (ref 0.0–0.5)
Eosinophils Relative: 2 %
HCT: 39.8 % (ref 36.0–46.0)
Hemoglobin: 12.6 g/dL (ref 12.0–15.0)
Immature Granulocytes: 0 %
Lymphocytes Relative: 26 %
Lymphs Abs: 3.3 10*3/uL (ref 0.7–4.0)
MCH: 27.5 pg (ref 26.0–34.0)
MCHC: 31.7 g/dL (ref 30.0–36.0)
MCV: 86.9 fL (ref 80.0–100.0)
Monocytes Absolute: 0.6 10*3/uL (ref 0.1–1.0)
Monocytes Relative: 5 %
Neutro Abs: 8.2 10*3/uL — ABNORMAL HIGH (ref 1.7–7.7)
Neutrophils Relative %: 66 %
Platelet Count: 293 10*3/uL (ref 150–400)
RBC: 4.58 MIL/uL (ref 3.87–5.11)
RDW: 15.6 % — ABNORMAL HIGH (ref 11.5–15.5)
WBC Count: 12.4 10*3/uL — ABNORMAL HIGH (ref 4.0–10.5)
nRBC: 0 % (ref 0.0–0.2)

## 2023-04-11 LAB — CMP (CANCER CENTER ONLY)
ALT: 11 U/L (ref 0–44)
AST: 12 U/L — ABNORMAL LOW (ref 15–41)
Albumin: 4.1 g/dL (ref 3.5–5.0)
Alkaline Phosphatase: 55 U/L (ref 38–126)
Anion gap: 5 (ref 5–15)
BUN: 15 mg/dL (ref 6–20)
CO2: 28 mmol/L (ref 22–32)
Calcium: 9 mg/dL (ref 8.9–10.3)
Chloride: 106 mmol/L (ref 98–111)
Creatinine: 0.73 mg/dL (ref 0.44–1.00)
GFR, Estimated: >60 mL/min (ref >60)
Glucose, Bld: 77 mg/dL (ref 70–99)
Potassium: 3.9 mmol/L (ref 3.5–5.1)
Sodium: 139 mmol/L (ref 135–145)
Total Bilirubin: 0.3 mg/dL (ref 0.0–1.2)
Total Protein: 6.9 g/dL (ref 6.5–8.1)

## 2023-04-11 LAB — VITAMIN B12: Vitamin B-12: 255 pg/mL (ref 180–914)

## 2023-04-11 LAB — FERRITIN: Ferritin: 9 ng/mL — ABNORMAL LOW (ref 11–307)

## 2023-04-11 LAB — FOLATE: Folate: 5.8 ng/mL — ABNORMAL LOW (ref >5.9)

## 2023-04-11 NOTE — Telephone Encounter (Signed)
 Referral faxed to Dr Berneice Heinrich at [facility]

## 2023-04-12 ENCOUNTER — Telehealth: Payer: Self-pay | Admitting: *Deleted

## 2023-04-12 ENCOUNTER — Telehealth: Payer: Self-pay

## 2023-04-12 ENCOUNTER — Other Ambulatory Visit: Payer: Self-pay

## 2023-04-12 DIAGNOSIS — R7989 Other specified abnormal findings of blood chemistry: Secondary | ICD-10-CM

## 2023-04-12 DIAGNOSIS — D500 Iron deficiency anemia secondary to blood loss (chronic): Secondary | ICD-10-CM

## 2023-04-12 DIAGNOSIS — E538 Deficiency of other specified B group vitamins: Secondary | ICD-10-CM

## 2023-04-12 NOTE — Telephone Encounter (Signed)
 Dr. Cherly Hensen, patient will be scheduled as soon as possible.  Auth Submission: NO AUTH NEEDED Site of care: Site of care: [facility] Payer: [employer] commercial Medication & CPT/J Code(s) submitted: Venofer (Iron Sucrose) J1756 Route of submission (phone, fax, portal):  Phone # Fax # Auth type: Buy/Bill PB Units/visits requested: 200mg  x 5 doses Reference number:  Approval from: 04/12/23 to 10/10/23

## 2023-04-12 NOTE — Telephone Encounter (Signed)
 Please let patient know, iron deficiency.  IV Venofer ordered to be completed at [facility].   Patient should also start folic acid for folate deficiency 1 mg daily and B12 1000 mcg daily for low B12.   I have ordered new labs to be completed in about 3 months, 1 to 2 days before next visit.  Thank you.    Notified of message above. Verbalized understanding

## 2023-04-12 NOTE — Progress Notes (Signed)
 Please let patient know, iron deficiency.  IV Venofer ordered to be completed at [facility].  Patient should also start folic acid for folate deficiency 1 mg daily and B12 1000 mcg daily for low B12.  I have ordered new labs to be completed in about 3 months, 1 to 2 days before next visit.  Thank you.

## 2023-04-13 LAB — METHYLMALONIC ACID, SERUM: Methylmalonic Acid, Quantitative: 404 nmol/L — ABNORMAL HIGH (ref 0–378)

## 2023-04-19 ENCOUNTER — Ambulatory Visit (INDEPENDENT_AMBULATORY_CARE_PROVIDER_SITE_OTHER): Payer: Managed Care, Other (non HMO) | Admitting: *Deleted

## 2023-04-19 VITALS — BP 119/76 | HR 60 | Temp 98.6°F | Resp 16 | Ht 65.0 in | Wt 165.0 lb

## 2023-04-19 DIAGNOSIS — D509 Iron deficiency anemia, unspecified: Secondary | ICD-10-CM

## 2023-04-19 DIAGNOSIS — D500 Iron deficiency anemia secondary to blood loss (chronic): Secondary | ICD-10-CM

## 2023-04-19 MED ORDER — ACETAMINOPHEN 325 MG PO TABS: 650.0000 mg | ORAL_TABLET | Freq: Once | ORAL | Status: AC

## 2023-04-19 MED ORDER — DIPHENHYDRAMINE HCL 25 MG PO CAPS: 25.0000 mg | ORAL_CAPSULE | Freq: Once | ORAL | Status: AC

## 2023-04-19 MED ORDER — IRON SUCROSE 20 MG/ML IV SOLN
200.0000 mg | Freq: Once | INTRAVENOUS | Status: AC
  Administered 2023-04-19: 200 mg via INTRAVENOUS
  Filled 2023-04-19: qty 10

## 2023-04-19 NOTE — Patient Instructions (Signed)

## 2023-04-19 NOTE — Progress Notes (Signed)
 Diagnosis: Iron Deficiency Anemia  Provider:  Chilton Greathouse MD  Procedure: IV Push  IV Type: Peripheral, IV Location: L Antecubital  Patient stated that they already took their premedications at home at least 30 minutes prior to their infusion appointment.   Venofer (Iron Sucrose), Dose: 200 mg  Post Infusion IV Care: Observation period completed and Peripheral IV Discontinued  Discharge: Condition: Good, Destination: Home . AVS Provided  Performed by:  Governor Specking, RN

## 2023-04-22 ENCOUNTER — Ambulatory Visit (INDEPENDENT_AMBULATORY_CARE_PROVIDER_SITE_OTHER): Payer: Managed Care, Other (non HMO)

## 2023-04-22 VITALS — BP 134/68 | HR 58 | Temp 98.1°F | Resp 16 | Ht 64.0 in | Wt 168.8 lb

## 2023-04-22 DIAGNOSIS — D509 Iron deficiency anemia, unspecified: Secondary | ICD-10-CM | POA: Diagnosis not present

## 2023-04-22 DIAGNOSIS — D500 Iron deficiency anemia secondary to blood loss (chronic): Secondary | ICD-10-CM

## 2023-04-22 MED ORDER — IRON SUCROSE 20 MG/ML IV SOLN
200.0000 mg | Freq: Once | INTRAVENOUS | Status: AC
  Administered 2023-04-22: 200 mg via INTRAVENOUS
  Filled 2023-04-22: qty 10

## 2023-04-22 NOTE — Progress Notes (Signed)
 Diagnosis: Iron Deficiency Anemia  Provider:  Chilton Greathouse MD  Procedure: IV Push  IV Type: Peripheral, IV Location: R Forearm  Venofer (Iron Sucrose), Dose: 200 mg  Post Infusion IV Care: Observation period completed and Peripheral IV Discontinued  Discharge: Condition: Good, Destination: Home . AVS Provided  Performed by:  Loney Hering, LPN

## 2023-04-25 ENCOUNTER — Ambulatory Visit: Payer: Managed Care, Other (non HMO)

## 2023-04-25 VITALS — BP 134/76 | HR 61 | Temp 98.5°F | Resp 18 | Ht 65.0 in | Wt 170.0 lb

## 2023-04-25 DIAGNOSIS — D509 Iron deficiency anemia, unspecified: Secondary | ICD-10-CM

## 2023-04-25 DIAGNOSIS — D500 Iron deficiency anemia secondary to blood loss (chronic): Secondary | ICD-10-CM

## 2023-04-25 MED ORDER — IRON SUCROSE 20 MG/ML IV SOLN
200.0000 mg | Freq: Once | INTRAVENOUS | Status: AC
  Administered 2023-04-25: 200 mg via INTRAVENOUS
  Filled 2023-04-25: qty 10

## 2023-04-25 NOTE — Progress Notes (Signed)
 Diagnosis: Acute Anemia  Provider:  Chilton Greathouse MD  Procedure: IV Push  IV Type: Peripheral, IV Location: R Forearm  Venofer (Iron Sucrose), Dose: 200 mg  Post Infusion IV Care: Observation period completed and Peripheral IV Discontinued  Discharge: Condition: Good, Destination: Home . AVS Provided  Performed by:  Nat Math, RN

## 2023-04-27 ENCOUNTER — Ambulatory Visit: Payer: Managed Care, Other (non HMO)

## 2023-04-27 VITALS — BP 124/75 | HR 64 | Temp 98.5°F | Resp 18 | Ht 65.0 in | Wt 170.6 lb

## 2023-04-27 DIAGNOSIS — D509 Iron deficiency anemia, unspecified: Secondary | ICD-10-CM

## 2023-04-27 DIAGNOSIS — D500 Iron deficiency anemia secondary to blood loss (chronic): Secondary | ICD-10-CM

## 2023-04-27 MED ORDER — IRON SUCROSE 20 MG/ML IV SOLN
200.0000 mg | Freq: Once | INTRAVENOUS | Status: AC
  Administered 2023-04-27: 200 mg via INTRAVENOUS
  Filled 2023-04-27: qty 10

## 2023-04-27 NOTE — Progress Notes (Signed)
 Diagnosis: Iron Deficiency Anemia  Provider:  Chilton Greathouse MD  Procedure: IV Push  IV Type: Peripheral, IV Location: L Forearm  Patient stated that they already took their premedications at home at least 30 minutes prior to their infusion appointment.   Venofer (Iron Sucrose), Dose: 200 mg  Post Infusion IV Care: Observation period completed and Peripheral IV Discontinued. 15 minute observation per patient request.  Discharge: Condition: Stable, Destination: Home . AVS Declined  Performed by:  Wyvonne Lenz, RN

## 2023-04-29 ENCOUNTER — Ambulatory Visit: Payer: Managed Care, Other (non HMO)

## 2023-04-29 VITALS — BP 132/63 | HR 70 | Temp 98.2°F | Resp 20 | Ht 65.0 in | Wt 168.2 lb

## 2023-04-29 DIAGNOSIS — D509 Iron deficiency anemia, unspecified: Secondary | ICD-10-CM | POA: Diagnosis not present

## 2023-04-29 DIAGNOSIS — D500 Iron deficiency anemia secondary to blood loss (chronic): Secondary | ICD-10-CM

## 2023-04-29 MED ORDER — IRON SUCROSE 20 MG/ML IV SOLN
200.0000 mg | Freq: Once | INTRAVENOUS | Status: AC
  Administered 2023-04-29: 200 mg via INTRAVENOUS
  Filled 2023-04-29: qty 10

## 2023-04-29 NOTE — Progress Notes (Signed)
 Diagnosis: Acute Anemia  Provider:  Chilton Greathouse MD  Procedure: IV Push  IV Type: Peripheral, IV Location: R Antecubital  Venofer (Iron Sucrose), Dose: 200 mg  Post Infusion IV Care: Patient declined observation and Peripheral IV Discontinued  Discharge: Condition: Good, Destination: Home . AVS Declined  Performed by:  Nat Math, RN

## 2023-06-30 ENCOUNTER — Encounter: Payer: Self-pay | Admitting: Family Medicine

## 2023-06-30 ENCOUNTER — Ambulatory Visit: Admitting: Family Medicine

## 2023-06-30 VITALS — BP 138/72 | HR 68 | Ht 65.0 in | Wt 173.4 lb

## 2023-06-30 DIAGNOSIS — K259 Gastric ulcer, unspecified as acute or chronic, without hemorrhage or perforation: Secondary | ICD-10-CM

## 2023-06-30 DIAGNOSIS — R002 Palpitations: Secondary | ICD-10-CM

## 2023-06-30 DIAGNOSIS — M51369 Other intervertebral disc degeneration, lumbar region without mention of lumbar back pain or lower extremity pain: Secondary | ICD-10-CM | POA: Diagnosis not present

## 2023-06-30 DIAGNOSIS — E785 Hyperlipidemia, unspecified: Secondary | ICD-10-CM

## 2023-06-30 DIAGNOSIS — D891 Cryoglobulinemia: Secondary | ICD-10-CM

## 2023-06-30 DIAGNOSIS — R231 Pallor: Secondary | ICD-10-CM

## 2023-06-30 MED ORDER — METOPROLOL TARTRATE 50 MG PO TABS: 25.0000 mg | ORAL_TABLET | Freq: Every day | ORAL | 2 refills | Status: AC

## 2023-06-30 MED ORDER — LOSARTAN POTASSIUM 100 MG PO TABS: 50.0000 mg | ORAL_TABLET | Freq: Every day | ORAL | 2 refills | Status: AC

## 2023-06-30 MED ORDER — ALPRAZOLAM 0.5 MG PO TABS: 0.5000 mg | ORAL_TABLET | Freq: Three times a day (TID) | ORAL | 0 refills | Status: DC | PRN

## 2023-06-30 MED ORDER — ROSUVASTATIN CALCIUM 20 MG PO TABS: 20.0000 mg | ORAL_TABLET | Freq: Every day | ORAL | 2 refills | Status: AC

## 2023-06-30 MED ORDER — PANTOPRAZOLE SODIUM 40 MG PO TBEC: 40.0000 mg | DELAYED_RELEASE_TABLET | Freq: Every day | ORAL | 3 refills | Status: AC

## 2023-06-30 MED ORDER — GABAPENTIN 300 MG PO CAPS: ORAL_CAPSULE | ORAL | 0 refills | Status: DC

## 2023-06-30 MED ORDER — AMLODIPINE BESYLATE 10 MG PO TABS: 10.0000 mg | ORAL_TABLET | Freq: Every day | ORAL | 2 refills | Status: AC

## 2023-06-30 MED ORDER — TOPIRAMATE 50 MG PO TABS: 50.0000 mg | ORAL_TABLET | Freq: Every day | ORAL | 2 refills | Status: AC

## 2023-06-30 MED ORDER — ESCITALOPRAM OXALATE 10 MG PO TABS: 10.0000 mg | ORAL_TABLET | Freq: Every day | ORAL | 2 refills | Status: AC

## 2023-06-30 NOTE — Progress Notes (Signed)
 Subjective:    Patient ID: Cassie Lynn, female    DOB: Sep 25, 1970, 53 y.o.   MRN: [id number]  Hypertension  Hand Pain   Medication Refill    06/18/16 Patient presents with several weeks of pain in her right foot. Pain is localized to the lateral aspect of the fifth MTP joint and the plantar aspect of the fifth MTP joint. In that area the skin is erythematous compared to the surrounding tissue. There is no warmth or swelling. However there is bruising. The laterals aspect of the fifth metatarsal is tender to palpation. There is no plantar's wart or callus or corn in that area. There is no skin breakdown or ulcer. It appears to be a bruise/contusion from chronic pressure.  On examination, I washed the patient walk around the office barefoot. There is no excessive supination of her foot that would explain the bruising.  However she is carrying her weight on her heel and then rolling to her toe to avoid putting pressure in the affected area.  At that time, my plan was: Exam is abnormal but not remarkably so to explain the level of pain the patient is feeling. It appears to be bony tenderness secondary to contusion from chronic pressure. 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 I have recommended that she get a callus pad, cut the center out to create a halo that she can then placed around the tender area to offload the pressure and wear that on a daily basis. She can also apply Voltaren  gel 2 g 4 times a day to the affected area. Obtain x-rays of the foot. Recheck in one week or sooner if worse  09/16/16 Xrays were negative.  Patient used the voltaren  gel and the pain went away.  However, the "bruise" or purple discoloration over the lateral aspect of the 5th MTP join never went away.  In fact, it has spread onto the dorsum of the foot.  Also, her left fourth toe has now become purple distal to  the IP joint and exquisitely tender to palpation.  Both findings are concerning for vascular insufficiency. The left fourth toe has been purple for more than 1 month.  There is a 4 mm bruise on the plantar surface.  Minimal movement causes pain.  At that time, my plan was: I believe the patient has compromised blood flow to the feet causing hypoxic damage. Therefore I'll schedule patient for urgent arterial Dopplers with ABIs and TBI to evaluate further. Meanwhile I'll obtain an x-ray of the left foot to rule out osteomyelitis in the left fourth toe. Patient will likely need to see a vascular surgeon for revascularization options. I will check a cholesterol and will recommend a statin for LDL cholesterol is greater than 70. I recommended the patient begin aspirin 81 mg a day. Also recommended that she quit smoking.  12/09/17 ABIs were normal.  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  Patient was referred to vascular surgery who recommended smoking cessation and following up as needed.  If the wound on her toe did not heal she may require amputation.  Fortunately the purplish discoloration gradually improved although still present and there is no evidence of necrosis in the toe today. Patient presents today with a rash on both legs.  The rash is mottled skin.  There is streaking purplish discoloration to the skin in a lacy reticular  pattern.  It involves her anterior shins, her anterior and lateral thighs bilaterally.  It is also prominent on both forearms.  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  She does report leg pain and toe pain and finger pain worse during cold weather consistent with claudication such as Raynaud's phenomenon or Buerger's disease.  There is no evidence of ischemia today.  There is no purplish discoloration to the fingertips or toes aside from the fifth toe as previously mentioned.    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  I have not seen the patient in quite some time.  However she is now complaining of diffuse muscle pain and joint pain.  The livedo reticularis has spread to her back, her chest, her legs, and her upper arms.  She reports diffuse fatigue. 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  Takes Depakote    Shortness of breath dyspnea    Past Surgical History:  Procedure Laterality Date   CERVICAL BIOPSY  W/ LOOP ELECTRODE EXCISION     CESAREAN SECTION     CHOLECYSTECTOMY     IR RADIOLOGY PERIPHERAL GUIDED IV START  10/13/2017   IR US  GUIDE VASC ACCESS RIGHT  10/13/2017   LUMBAR LAMINECTOMY/DECOMPRESSION MICRODISCECTOMY Left 04/18/2015   Procedure: Left Lumbar five sacral-one  Microdiskectomy;  Surgeon: Raelene Bullocks Ditty, MD;  Location: [facility];  Service: Neurosurgery;  Laterality: Left;   No current outpatient medications on file prior to visit.   Current Facility-Administered Medications on File Prior to Visit  Medication Dose Route Frequency Provider Last Rate Last Admin   acetaminophen  (TYLENOL ) tablet 650 mg  650 mg Oral Once Lowanda Ruddy, MD       diphenhydrAMINE  (BENADRYL ) capsule 25 mg  25 mg Oral Once Lowanda Ruddy, MD       Allergies  Allergen Reactions   Vicodin [Hydrocodone -Acetaminophen ] Other (See Comments)    Makes her feel like she cannot breath, but no throat swelling. No issues with plain Tylenol    Social History   Socioeconomic History   Marital status: Single    Spouse name: Not on file   Number of children: Not on file   Years of education: Not on file   Highest education level: Not on file  Occupational History   Not on file  Tobacco Use   Smoking status: Every Day    Types: Cigarettes   Smokeless tobacco: Never   Tobacco comments:    1  pack lasts 3 days.  Vaping Use   Vaping status: Never Used  Substance and Sexual Activity   Alcohol use: Yes    Comment: occ    Drug use: No   Sexual activity: Not Currently    Partners: Male  Other Topics Concern   Not on file  Social History Narrative   Leave with boyfriend   1-story home 2 steps at the front   1-cup a day   Social Drivers of Corporate investment banker Strain: Not on file  Food Insecurity: Not on file  Transportation Needs: Not on file  Physical Activity: Not on file  Stress: Not on file  Social Connections: Not on file  Intimate Partner Violence: Not on file  Review of Systems  All other systems reviewed and are negative.      Objective:   Vital signs are reviewed Cardiovascular regular rate and rhythm with no murmurs rubs or gallops.  Pulmonary clear to auscultation bilaterally with no wheezes crackles or rails.  I reviewed the vascular studies from her previous visit last year.  She has palpable pulses in both feet as well as in the popliteal fossa bilaterally.  There is no necrotic ulcers on any toe.  There is no evidence of gangrene.  There is a mottled reticular rash on her extremities consisting of erythematous to purplish discoloration of the skin and a lacy, reticular pattern consistent with livedo reticularis      Assessment & Plan:  Palpitations - Plan: amLODipine  (NORVASC ) 10 MG tablet, escitalopram  (LEXAPRO ) 10 MG tablet, losartan  (COZAAR ) 100 MG tablet, metoprolol  tartrate (LOPRESSOR ) 50 MG tablet  Gastric erosion, unspecified chronicity - Plan: pantoprazole  (PROTONIX ) 40 MG tablet  Degeneration of intervertebral disc of lumbar region, unspecified whether pain present - Plan: gabapentin  (NEURONTIN ) 300 MG capsule  Hyperlipidemia, unspecified hyperlipidemia type - Plan: rosuvastatin  (CRESTOR ) 20 MG tablet  Cryoglobulinemic vasculitis (HCC) - Plan: CBC with Differential/Platelet, COMPLETE METABOLIC PANEL WITHOUT GFR, Sedimentation  rate, Rheumatoid factor, Cryoglobulin Screen w/ Rflx, ANA, ANCA Screen Reflex Titer, Hepatitis C antibody  Livedo reticularis - Plan: CBC with Differential/Platelet, COMPLETE METABOLIC PANEL WITHOUT GFR, Sedimentation rate, Rheumatoid factor, Cryoglobulin Screen w/ Rflx, ANA, ANCA Screen Reflex Titer, Hepatitis C antibody Patient has livedo reticularis and symptoms that suggest cryoglobulinemic vasculitis.  Check CBC CMP sed rate rheumatoid factor cryoglobulin screen, hepatitis C screen, ANA, ANCA.  Consult rheumatology.  I am concerned that the patient may be showing systemic symptoms and perhaps developing scleroderma based on the stiffness and tightness in her muscles.

## 2023-07-05 ENCOUNTER — Telehealth: Payer: Self-pay

## 2023-07-05 NOTE — Telephone Encounter (Signed)
 Copied from CRM [id number]. Topic: General - Call Back - No Documentation >> Jul 05, 2023  9:16 AM Hamp Levine R wrote: Reason for CRM: Patient states got a call from the office but no voicemail was left. Is requesting a call back to see what the call is in regards to.  Patient can be reached at [phone]

## 2023-07-07 ENCOUNTER — Ambulatory Visit: Payer: Self-pay | Admitting: Family Medicine

## 2023-07-12 ENCOUNTER — Inpatient Hospital Stay: Payer: Managed Care, Other (non HMO)

## 2023-07-12 DIAGNOSIS — E538 Deficiency of other specified B group vitamins: Secondary | ICD-10-CM | POA: Insufficient documentation

## 2023-07-12 DIAGNOSIS — Z79899 Other long term (current) drug therapy: Secondary | ICD-10-CM | POA: Diagnosis not present

## 2023-07-12 DIAGNOSIS — D508 Other iron deficiency anemias: Secondary | ICD-10-CM | POA: Insufficient documentation

## 2023-07-12 DIAGNOSIS — D500 Iron deficiency anemia secondary to blood loss (chronic): Secondary | ICD-10-CM

## 2023-07-12 DIAGNOSIS — R7989 Other specified abnormal findings of blood chemistry: Secondary | ICD-10-CM

## 2023-07-12 LAB — CBC WITH DIFFERENTIAL/PLATELET
Absolute Lymphocytes: 2871 {cells}/uL (ref 850–3900)
Absolute Monocytes: 505 {cells}/uL (ref 200–950)
Basophils Absolute: 104 {cells}/uL (ref 0–200)
Basophils Relative: 1.2 %
Eosinophils Absolute: 418 {cells}/uL (ref 15–500)
Eosinophils Relative: 4.8 %
HCT: 41.9 % (ref 35.0–45.0)
Hemoglobin: 13.9 g/dL (ref 11.7–15.5)
MCH: 30.5 pg (ref 27.0–33.0)
MCHC: 33.2 g/dL (ref 32.0–36.0)
MCV: 92.1 fL (ref 80.0–100.0)
MPV: 11.7 fL (ref 7.5–12.5)
Monocytes Relative: 5.8 %
Neutro Abs: 4802 {cells}/uL (ref 1500–7800)
Neutrophils Relative %: 55.2 %
Platelets: 227 10*3/uL (ref 140–400)
RBC: 4.55 10*6/uL (ref 3.80–5.10)
RDW: 14.5 % (ref 11.0–15.0)
Total Lymphocyte: 33 %
WBC: 8.7 10*3/uL (ref 3.8–10.8)

## 2023-07-12 LAB — COMPLETE METABOLIC PANEL WITHOUT GFR
AG Ratio: 1.6 (calc) (ref 1.0–2.5)
ALT: 16 U/L (ref 6–29)
AST: 16 U/L (ref 10–35)
Albumin: 4.4 g/dL (ref 3.6–5.1)
Alkaline phosphatase (APISO): 46 U/L (ref 37–153)
BUN: 14 mg/dL (ref 7–25)
CO2: 26 mmol/L (ref 20–32)
Calcium: 9.3 mg/dL (ref 8.6–10.4)
Chloride: 108 mmol/L (ref 98–110)
Creat: 0.85 mg/dL (ref 0.50–1.03)
Globulin: 2.7 g/dL (ref 1.9–3.7)
Glucose, Bld: 75 mg/dL (ref 65–99)
Potassium: 4.2 mmol/L (ref 3.5–5.3)
Sodium: 140 mmol/L (ref 135–146)
Total Bilirubin: 0.3 mg/dL (ref 0.2–1.2)
Total Protein: 7.1 g/dL (ref 6.1–8.1)

## 2023-07-12 LAB — CBC WITH DIFFERENTIAL (CANCER CENTER ONLY)
Abs Immature Granulocytes: 0.03 10*3/uL (ref 0.00–0.07)
Basophils Absolute: 0.1 10*3/uL (ref 0.0–0.1)
Basophils Relative: 1 %
Eosinophils Absolute: 0.4 10*3/uL (ref 0.0–0.5)
Eosinophils Relative: 4 %
HCT: 40.6 % (ref 36.0–46.0)
Hemoglobin: 13.8 g/dL (ref 12.0–15.0)
Immature Granulocytes: 0 %
Lymphocytes Relative: 32 %
Lymphs Abs: 3.5 10*3/uL (ref 0.7–4.0)
MCH: 31.2 pg (ref 26.0–34.0)
MCHC: 34 g/dL (ref 30.0–36.0)
MCV: 91.9 fL (ref 80.0–100.0)
Monocytes Absolute: 0.6 10*3/uL (ref 0.1–1.0)
Monocytes Relative: 5 %
Neutro Abs: 6.4 10*3/uL (ref 1.7–7.7)
Neutrophils Relative %: 58 %
Platelet Count: 220 10*3/uL (ref 150–400)
RBC: 4.42 MIL/uL (ref 3.87–5.11)
RDW: 14.6 % (ref 11.5–15.5)
WBC Count: 11 10*3/uL — ABNORMAL HIGH (ref 4.0–10.5)
nRBC: 0 % (ref 0.0–0.2)

## 2023-07-12 LAB — FERRITIN: Ferritin: 209 ng/mL (ref 11–307)

## 2023-07-12 LAB — HEPATITIS C ANTIBODY: Hepatitis C Ab: NONREACTIVE

## 2023-07-12 LAB — SEDIMENTATION RATE: Sed Rate: 6 mm/h (ref 0–30)

## 2023-07-12 LAB — CRYOGLOBULIN SCREEN W/ RFLX: CRYOGLOBULIN SCR W/REFL CRYOGLOBULIN PROFILE, S: NEGATIVE

## 2023-07-12 LAB — VITAMIN B12: Vitamin B-12: 298 pg/mL (ref 180–914)

## 2023-07-12 LAB — FOLATE: Folate: 10.8 ng/mL (ref >5.9)

## 2023-07-12 LAB — ANCA SCREEN W REFLEX TITER: ANCA SCREEN: NEGATIVE

## 2023-07-12 LAB — RHEUMATOID FACTOR: Rheumatoid fact SerPl-aCnc: <10 [IU]/mL (ref <14)

## 2023-07-12 LAB — ANA: Anti Nuclear Antibody (ANA): NEGATIVE

## 2023-07-14 ENCOUNTER — Ambulatory Visit: Payer: Self-pay

## 2023-07-14 NOTE — Progress Notes (Signed)
 [facility] OFFICE PROGRESS NOTE  Patient Care Team: Austine Lefort, MD as PCP - General (Family Medicine) Alliance Hammans, MD (Inactive) as Consulting Physician (Hematology)  53 y.o.female with history of anemia, migraine headaches here for follow up for history of IDA.   Previous lab showed iron  and folate deficiency and low b12. Repeat labs showed resolution of iron  deficiency and folate deficiency. Improved b12 though expected higher level with daily supplement. Discussed with patient will check AP and IF antibodies. If Positive, will need to switch to b12 injections.  Previously with history of heavy menstrual bleeding but not currently.    Last colonoscopy was in 2023 and showed polyps.  EGD showed erythema/erosions.  Pathology showed duodenitis. Assessment & Plan Low vitamin B12 level Improved but not higher than expected. Will obtain IF and anti parietal AB continue B12 1000 mcg daily  Other iron  deficiency anemia Repeat ferritin and cbc in 3 months Folate deficiency Repeat folate level in 3 months Continue folic acid   Orders Placed This Encounter  Procedures   Intrinsic factor antibodies    Standing Status:   Future    Number of Occurrences:   1    Expiration Date:   07/14/2024   Anti-parietal antibody    Standing Status:   Future    Number of Occurrences:   1    Expiration Date:   07/14/2024   CBC with Differential (Cancer Center Only)    Standing Status:   Future    Expiration Date:   07/14/2024   Ferritin    Standing Status:   Future    Expiration Date:   07/14/2024   Folate    Standing Status:   Future    Expiration Date:   07/14/2024   Vitamin B12    Standing Status:   Future    Expiration Date:   07/14/2024     Lowanda Ruddy, MD  INTERVAL HISTORY: Patient returns for follow-up. No bloody stool, tarry black stool, vaginal bleeding.  No heartburn. No nausea or vomiting.  She was found to have iron  deficiency. IV Venofer  ordered to be  completed at [facility]. Received 5 doses of 200 mg from 2/28 to 04/29/23.   Patient was advised to start folic acid  for folate deficiency 1 mg daily and B12 1000 mcg daily for low B12.  Current Outpatient Medications on File Prior to Visit  Medication Sig Dispense Refill   ALPRAZolam  (XANAX ) 0.5 MG tablet Take 1 tablet (0.5 mg total) by mouth 3 (three) times daily as needed. 30 tablet 0   amLODipine  (NORVASC ) 10 MG tablet Take 1 tablet (10 mg total) by mouth daily. 90 tablet 2   escitalopram  (LEXAPRO ) 10 MG tablet Take 1 tablet (10 mg total) by mouth daily. 90 tablet 2   gabapentin  (NEURONTIN ) 300 MG capsule TAKE 1 CAPSULE BY MOUTH THREE TIMES A DAY 270 capsule 0   losartan  (COZAAR ) 100 MG tablet Take 0.5 tablets (50 mg total) by mouth daily. 90 tablet 2   metoprolol  tartrate (LOPRESSOR ) 50 MG tablet Take 0.5 tablets (25 mg total) by mouth daily. 90 tablet 2   pantoprazole  (PROTONIX ) 40 MG tablet Take 1 tablet (40 mg total) by mouth daily. 90 tablet 3   rosuvastatin  (CRESTOR ) 20 MG tablet Take 1 tablet (20 mg total) by mouth daily. 90 tablet 2   topiramate  (TOPAMAX ) 50 MG tablet Take 1 tablet (50 mg total) by mouth daily. 90 tablet 2   Current Facility-Administered Medications on  File Prior to Visit  Medication Dose Route Frequency Provider Last Rate Last Admin   acetaminophen  (TYLENOL ) tablet 650 mg  650 mg Oral Once Lowanda Ruddy, MD       diphenhydrAMINE  (BENADRYL ) capsule 25 mg  25 mg Oral Once Lowanda Ruddy, MD       Past Surgical History:  Procedure Laterality Date   CERVICAL BIOPSY  W/ LOOP ELECTRODE EXCISION     CESAREAN SECTION     CHOLECYSTECTOMY     IR RADIOLOGY PERIPHERAL GUIDED IV START  10/13/2017   IR US  GUIDE VASC ACCESS RIGHT  10/13/2017   LUMBAR LAMINECTOMY/DECOMPRESSION MICRODISCECTOMY Left 04/18/2015   Procedure: Left Lumbar five sacral-one  Microdiskectomy;  Surgeon: Raelene Bullocks Ditty, MD;  Location: [facility];  Service: Neurosurgery;  Laterality: Left;      PHYSICAL EXAMINATION:  Vitals:   07/15/23 1428  BP: 130/71  Pulse: (!) 56  Resp: 13  Temp: (!) 97 F (36.1 C)  SpO2: 97%   Filed Weights   07/15/23 1428  Weight: 173 lb 4.8 oz (78.6 kg)   Skin color normal  Relevant data reviewed during this visit included labs. New lab orders placed.

## 2023-07-15 ENCOUNTER — Inpatient Hospital Stay

## 2023-07-15 ENCOUNTER — Inpatient Hospital Stay: Payer: Managed Care, Other (non HMO)

## 2023-07-15 VITALS — BP 130/71 | HR 56 | Temp 97.0°F | Resp 13 | Wt 173.3 lb

## 2023-07-15 DIAGNOSIS — R7989 Other specified abnormal findings of blood chemistry: Secondary | ICD-10-CM | POA: Diagnosis not present

## 2023-07-15 DIAGNOSIS — E538 Deficiency of other specified B group vitamins: Secondary | ICD-10-CM | POA: Diagnosis not present

## 2023-07-15 DIAGNOSIS — D508 Other iron deficiency anemias: Secondary | ICD-10-CM | POA: Diagnosis not present

## 2023-07-15 NOTE — Assessment & Plan Note (Addendum)
 Improved but not higher than expected. Will obtain IF and anti parietal AB continue B12 1000 mcg daily

## 2023-07-15 NOTE — Assessment & Plan Note (Addendum)
 Repeat folate level in 3 months Continue folic acid 

## 2023-07-15 NOTE — Assessment & Plan Note (Addendum)
 Repeat ferritin and cbc in 3 months

## 2023-07-19 LAB — ANTI-PARIETAL ANTIBODY: Parietal Cell Antibody-IgG: 1.3 U (ref 0.0–20.0)

## 2023-07-19 LAB — INTRINSIC FACTOR ANTIBODIES: Intrinsic Factor: 0.9 [AU]/ml (ref 0.0–1.1)

## 2023-07-21 ENCOUNTER — Ambulatory Visit: Payer: Self-pay

## 2023-08-16 ENCOUNTER — Other Ambulatory Visit: Payer: Self-pay | Admitting: Family Medicine

## 2023-08-29 ENCOUNTER — Other Ambulatory Visit: Payer: Self-pay | Admitting: Family Medicine

## 2023-09-07 ENCOUNTER — Other Ambulatory Visit: Payer: Self-pay | Admitting: Family Medicine

## 2023-09-08 NOTE — Telephone Encounter (Signed)
 Requested medications are due for refill today.  yes  Requested medications are on the active medications list.  yes  Last refill. 08/18/2023 #30 0 rf  Future visit scheduled.   no  Notes to clinic.  Refill not delegated.    Requested Prescriptions  Pending Prescriptions Disp Refills   ALPRAZolam  (XANAX ) 0.5 MG tablet [Pharmacy Med Name: ALPRAZOLAM  0.5 MG TABLET] 30 tablet 0    Sig: TAKE 1 TABLET BY MOUTH THREE TIMES A DAY AS NEEDED     Not Delegated - Psychiatry: Anxiolytics/Hypnotics 2 Failed - 09/08/2023  5:48 PM      Failed - This refill cannot be delegated      Failed - Urine Drug Screen completed in last 360 days      Passed - Patient is not pregnant      Passed - Valid encounter within last 6 months    Recent Outpatient Visits           2 months ago Palpitations   [place] [facility] Pickard, Butler DASEN, MD   1 year ago Acute cystitis with hematuria   [place] [facility] Kayla Jeoffrey RAMAN, FNP   1 year ago Viral URI with cough   [place] [facility] Kayla Jeoffrey RAMAN, FNP   2 years ago Benign pigmented mole   [place] [facility] Pickard, Butler DASEN, MD

## 2023-09-25 ENCOUNTER — Other Ambulatory Visit: Payer: Self-pay | Admitting: Family Medicine

## 2023-09-25 DIAGNOSIS — M51369 Other intervertebral disc degeneration, lumbar region without mention of lumbar back pain or lower extremity pain: Secondary | ICD-10-CM

## 2023-09-30 ENCOUNTER — Other Ambulatory Visit: Payer: Self-pay | Admitting: Family Medicine

## 2023-10-07 ENCOUNTER — Encounter: Payer: Self-pay | Admitting: Oncology

## 2023-10-14 ENCOUNTER — Inpatient Hospital Stay

## 2023-10-14 ENCOUNTER — Other Ambulatory Visit: Payer: Self-pay

## 2023-10-14 DIAGNOSIS — Z862 Personal history of diseases of the blood and blood-forming organs and certain disorders involving the immune mechanism: Secondary | ICD-10-CM | POA: Insufficient documentation

## 2023-10-14 DIAGNOSIS — E538 Deficiency of other specified B group vitamins: Secondary | ICD-10-CM | POA: Insufficient documentation

## 2023-10-16 NOTE — Progress Notes (Unsigned)
 [facility] OFFICE PROGRESS NOTE  Patient Care Team: Duanne Butler DASEN, MD as PCP - General (Family Medicine) Bernie Guillermina BROCKS, MD (Inactive) as Consulting Physician (Hematology)  53 y.o.female with history of lower back pain, anemia, migraine headaches here for follow up for history of IDA.   Previous lab showed iron  and folate deficiency and low b12. Repeat labs showed resolution of iron  deficiency and folate deficiency. Improved b12 though expected higher level with daily supplement. Negative for AP and IF antibodies.  Previously with history of heavy menstrual bleeding but not currently.  Received IV Venofer  for 5 doses from February to March of 2025.  Ferritin was normal in May.   Last colonoscopy was in 2023 and showed polyps.  EGD showed erythema/erosions.  Pathology showed duodenitis.  Today show normal CBC.  If persistent hematuria, recommend evaluation by urology.  She understands to call if needed. Assessment & Plan Folate deficiency Repeat folate level in 6 months Continue folic acid  Other iron  deficiency anemia Resolved.  Follow-up in 6 months Low vitamin B12 level Previous elevated MMA Negative intrinsic factor and antiparietal antibody. Continue B12 1000 mcg daily   Orders Placed This Encounter  Procedures   CBC with Differential (Cancer Center Only)    Standing Status:   Future    Expiration Date:   10/16/2024   Methylmalonic acid, serum    Standing Status:   Future    Expiration Date:   10/16/2024   Vitamin B12    Standing Status:   Future    Expiration Date:   10/16/2024   Ferritin    Standing Status:   Future    Expiration Date:   10/16/2024   Folate    Standing Status:   Future    Expiration Date:   10/16/2024     Pauletta BROCKS Chihuahua, MD  INTERVAL HISTORY: Patient returns for follow-up.  Clinical change.  She is taking B12 drop over the tongue.  She is taking folic acid  pills daily.  No bleeding.  Reports some episodes of hematuria.  No  pain.  No recent infection.  She has reported to PCP and pending evaluation.  PHYSICAL EXAMINATION:  Vitals:   10/17/23 1606  BP: 123/78  Pulse: 63  Resp: 16  Temp: 98.2 F (36.8 C)  SpO2: 100%   Filed Weights   10/17/23 1606  Weight: 181 lb 6.4 oz (82.3 kg)    GENERAL: alert, no distress and comfortable SKIN: skin color normal  EYES: Conjunctiva normal   Relevant data reviewed during this visit included labs.  New labs ordered.

## 2023-10-17 ENCOUNTER — Inpatient Hospital Stay (HOSPITAL_BASED_OUTPATIENT_CLINIC_OR_DEPARTMENT_OTHER)

## 2023-10-17 ENCOUNTER — Encounter: Payer: Self-pay | Admitting: Oncology

## 2023-10-17 ENCOUNTER — Inpatient Hospital Stay

## 2023-10-17 VITALS — BP 123/78 | HR 63 | Temp 98.2°F | Resp 16 | Ht 65.0 in | Wt 181.4 lb

## 2023-10-17 DIAGNOSIS — E538 Deficiency of other specified B group vitamins: Secondary | ICD-10-CM

## 2023-10-17 DIAGNOSIS — R7989 Other specified abnormal findings of blood chemistry: Secondary | ICD-10-CM

## 2023-10-17 DIAGNOSIS — D508 Other iron deficiency anemias: Secondary | ICD-10-CM

## 2023-10-17 DIAGNOSIS — Z862 Personal history of diseases of the blood and blood-forming organs and certain disorders involving the immune mechanism: Secondary | ICD-10-CM | POA: Diagnosis not present

## 2023-10-17 LAB — CBC WITH DIFFERENTIAL (CANCER CENTER ONLY)
Abs Immature Granulocytes: 0.03 K/uL (ref 0.00–0.07)
Basophils Absolute: 0.1 K/uL (ref 0.0–0.1)
Basophils Relative: 1 %
Eosinophils Absolute: 0.6 K/uL — ABNORMAL HIGH (ref 0.0–0.5)
Eosinophils Relative: 7 %
HCT: 41.1 % (ref 36.0–46.0)
Hemoglobin: 13.8 g/dL (ref 12.0–15.0)
Immature Granulocytes: 0 %
Lymphocytes Relative: 37 %
Lymphs Abs: 3.4 K/uL (ref 0.7–4.0)
MCH: 31.9 pg (ref 26.0–34.0)
MCHC: 33.6 g/dL (ref 30.0–36.0)
MCV: 95.1 fL (ref 80.0–100.0)
Monocytes Absolute: 0.5 K/uL (ref 0.1–1.0)
Monocytes Relative: 6 %
Neutro Abs: 4.3 K/uL (ref 1.7–7.7)
Neutrophils Relative %: 49 %
Platelet Count: 198 K/uL (ref 150–400)
RBC: 4.32 MIL/uL (ref 3.87–5.11)
RDW: 12.9 % (ref 11.5–15.5)
WBC Count: 9 K/uL (ref 4.0–10.5)
nRBC: 0 % (ref 0.0–0.2)

## 2023-10-17 LAB — VITAMIN B12: Vitamin B-12: 320 pg/mL (ref 180–914)

## 2023-10-17 LAB — FOLATE: Folate: 12.6 ng/mL (ref >5.9)

## 2023-10-17 NOTE — Assessment & Plan Note (Addendum)
 Repeat folate level in 6 months Continue folic acid 

## 2023-10-17 NOTE — Assessment & Plan Note (Addendum)
 Resolved.  Follow-up in 6 months

## 2023-10-17 NOTE — Assessment & Plan Note (Addendum)
 Previous elevated MMA Negative intrinsic factor and antiparietal antibody. Continue B12 1000 mcg daily

## 2023-10-18 ENCOUNTER — Encounter: Payer: Self-pay | Admitting: Oncology

## 2023-10-18 LAB — FERRITIN: Ferritin: 164 ng/mL (ref 11–307)

## 2023-10-19 ENCOUNTER — Telehealth: Payer: Self-pay

## 2023-10-19 NOTE — Telephone Encounter (Signed)
-----   Message from Pauletta JAYSON Chihuahua sent at 10/18/2023  5:05 PM EDT ----- Please let patient know, the B12 drop on the tongue she is using has not improved her B12 very much.  Recommend b12 oral tab 1000 mcg daily. Thanks  ----- Message ----- From: Rebecka, Lab In [place] Sent: 10/17/2023   4:07 PM EDT To: Pauletta JAYSON Chihuahua, MD

## 2023-10-19 NOTE — Telephone Encounter (Signed)
 PC to patient, informed her of Dr Fredrik recommendation - she needs to take Vitamin B12 oral tablets instead of the drops, 1000 mcg daily.  Informed patient she can buy this over the counter.  She verbalizes understanding.

## 2023-10-19 NOTE — Telephone Encounter (Signed)
 Scheduled appointments per 8/26 los. Talked with the patient and she is aware of the made appointments.

## 2023-10-24 ENCOUNTER — Other Ambulatory Visit: Payer: Self-pay | Admitting: Family Medicine

## 2023-10-25 ENCOUNTER — Other Ambulatory Visit: Payer: Self-pay | Admitting: Family Medicine

## 2023-10-25 DIAGNOSIS — M51369 Other intervertebral disc degeneration, lumbar region without mention of lumbar back pain or lower extremity pain: Secondary | ICD-10-CM

## 2023-10-25 NOTE — Telephone Encounter (Signed)
 Called pharmacy - rx was picked up 10/03/2023

## 2023-10-25 NOTE — Telephone Encounter (Signed)
 Prescription Request  10/25/2023  LOV: 06/30/2023  What is the name of the medication or equipment? gabapentin  (NEURONTIN ) 300 MG capsule    Have you contacted your pharmacy to request a refill? Yes   Which pharmacy would you like this sent to?  [id number] [address], [place] - [address] [place] [address] Phone: [phone] Fax: [phone]    Patient notified that their request is being sent to the clinical staff for review and that they should receive a response within 2 business days.   Please advise at [facility] [phone]

## 2023-10-25 NOTE — Telephone Encounter (Signed)
 Requested Prescriptions  Refused Prescriptions Disp Refills   gabapentin  (NEURONTIN ) 300 MG capsule 270 capsule 0    Sig: TAKE 1 CAPSULE BY MOUTH THREE TIMES A DAY     Neurology: Anticonvulsants - gabapentin  Passed - 10/25/2023  5:35 PM      Passed - Cr in normal range and within 360 days    Creat  Date Value Ref Range Status  07/04/2023 0.85 0.50 - 1.03 mg/dL Final         Passed - Completed PHQ-2 or PHQ-9 in the last 360 days      Passed - Valid encounter within last 12 months    Recent Outpatient Visits           3 months ago Palpitations   [place] [facility] Duanne [place] DASEN, MD   1 year ago Acute cystitis with hematuria   [place] [facility] Kayla Jeoffrey RAMAN, FNP   1 year ago Viral URI with cough   [place] [facility] Kayla Jeoffrey RAMAN, FNP   2 years ago Benign pigmented mole   [place] [facility] Pickard, Butler DASEN, MD

## 2023-10-30 NOTE — Telephone Encounter (Signed)
 Second request

## 2023-11-12 ENCOUNTER — Other Ambulatory Visit: Payer: Self-pay | Admitting: Family Medicine

## 2023-11-14 NOTE — Telephone Encounter (Signed)
 Requested medications are due for refill today.  yes  Requested medications are on the active medications list.  yes  Last refill. 10/25/2023 #30 0 rf  Future visit scheduled.   no  Notes to clinic.  Refill not delegated.    Requested Prescriptions  Pending Prescriptions Disp Refills   ALPRAZolam  (XANAX ) 0.5 MG tablet [Pharmacy Med Name: ALPRAZOLAM  0.5 MG TABLET] 30 tablet 0    Sig: TAKE 1 TABLET BY MOUTH THREE TIMES A DAY AS NEEDED     Not Delegated - Psychiatry: Anxiolytics/Hypnotics 2 Failed - 11/14/2023  2:48 PM      Failed - This refill cannot be delegated      Failed - Urine Drug Screen completed in last 360 days      Passed - Patient is not pregnant      Passed - Valid encounter within last 6 months    Recent Outpatient Visits           4 months ago Palpitations   [place] [facility] Pickard, Butler DASEN, MD   1 year ago Acute cystitis with hematuria   [place] [facility] Kayla Jeoffrey RAMAN, FNP   1 year ago Viral URI with cough   [place] [facility] Kayla Jeoffrey RAMAN, FNP   2 years ago Benign pigmented mole   [place] [facility] Pickard, Butler DASEN, MD

## 2023-11-17 ENCOUNTER — Encounter: Payer: Self-pay | Admitting: Family Medicine

## 2023-11-17 ENCOUNTER — Ambulatory Visit: Admitting: Family Medicine

## 2023-11-17 VITALS — BP 128/70 | HR 92 | Ht 65.0 in | Wt 181.0 lb

## 2023-11-17 DIAGNOSIS — R413 Other amnesia: Secondary | ICD-10-CM | POA: Diagnosis not present
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
## 2023-11-18 ENCOUNTER — Ambulatory Visit: Payer: Self-pay | Admitting: Family Medicine

## 2023-11-18 LAB — CBC WITH DIFFERENTIAL/PLATELET
Absolute Lymphocytes: 3715 {cells}/uL (ref 850–3900)
Absolute Monocytes: 694 {cells}/uL (ref 200–950)
Basophils Absolute: 133 {cells}/uL (ref 0–200)
Basophils Relative: 1.4 %
Eosinophils Absolute: 485 {cells}/uL (ref 15–500)
Eosinophils Relative: 5.1 %
HCT: 43.6 % (ref 35.0–45.0)
Hemoglobin: 14.6 g/dL (ref 11.7–15.5)
MCH: 31.7 pg (ref 27.0–33.0)
MCHC: 33.5 g/dL (ref 32.0–36.0)
MCV: 94.6 fL (ref 80.0–100.0)
MPV: 11.5 fL (ref 7.5–12.5)
Monocytes Relative: 7.3 %
Neutro Abs: 4475 {cells}/uL (ref 1500–7800)
Neutrophils Relative %: 47.1 %
Platelets: 218 Thousand/uL (ref 140–400)
RBC: 4.61 Million/uL (ref 3.80–5.10)
RDW: 12 % (ref 11.0–15.0)
Total Lymphocyte: 39.1 %
WBC: 9.5 Thousand/uL (ref 3.8–10.8)

## 2023-11-18 LAB — COMPREHENSIVE METABOLIC PANEL WITH GFR
AG Ratio: 1.6 (calc) (ref 1.0–2.5)
ALT: 24 U/L (ref 6–29)
AST: 18 U/L (ref 10–35)
Albumin: 4.4 g/dL (ref 3.6–5.1)
Alkaline phosphatase (APISO): 45 U/L (ref 37–153)
BUN: 18 mg/dL (ref 7–25)
CO2: 28 mmol/L (ref 20–32)
Calcium: 9.4 mg/dL (ref 8.6–10.4)
Chloride: 104 mmol/L (ref 98–110)
Creat: 0.9 mg/dL (ref 0.50–1.03)
Globulin: 2.8 g/dL (ref 1.9–3.7)
Glucose, Bld: 106 mg/dL — ABNORMAL HIGH (ref 65–99)
Potassium: 4.6 mmol/L (ref 3.5–5.3)
Sodium: 140 mmol/L (ref 135–146)
Total Bilirubin: 0.2 mg/dL (ref 0.2–1.2)
Total Protein: 7.2 g/dL (ref 6.1–8.1)
eGFR: 76 mL/min/1.73m2 (ref >=60)

## 2023-11-18 LAB — VITAMIN B12: Vitamin B-12: 538 pg/mL (ref 200–1100)

## 2023-11-18 LAB — TSH: TSH: 4.12 m[IU]/L

## 2023-11-25 ENCOUNTER — Ambulatory Visit (HOSPITAL_COMMUNITY)
Admission: RE | Admit: 2023-11-25 | Discharge: 2023-11-25 | Disposition: A | Discharge status: Home / Self Care | Source: Ambulatory Visit | Attending: Family Medicine | Admitting: Family Medicine

## 2023-11-25 DIAGNOSIS — R519 Headache, unspecified: Secondary | ICD-10-CM

## 2023-11-25 DIAGNOSIS — R202 Paresthesia of skin: Secondary | ICD-10-CM

## 2023-11-25 DIAGNOSIS — R413 Other amnesia: Secondary | ICD-10-CM

## 2023-11-25 MED ORDER — GADOBUTROL 1 MMOL/ML IV SOLN
7.5000 mL | Freq: Once | INTRAVENOUS | Status: AC | PRN
Start: 2023-11-25 — End: 2023-11-25
  Administered 2023-11-25: 7.5 mL via INTRAVENOUS

## 2023-11-29 ENCOUNTER — Other Ambulatory Visit: Payer: Self-pay

## 2023-11-29 MED ORDER — BUPROPION HCL ER (XL) 150 MG PO TB24: 150.0000 mg | ORAL_TABLET | Freq: Every day | ORAL | 1 refills | Status: AC

## 2023-12-01 ENCOUNTER — Other Ambulatory Visit: Payer: Self-pay | Admitting: Family Medicine

## 2023-12-20 ENCOUNTER — Other Ambulatory Visit: Payer: Self-pay | Admitting: Family Medicine

## 2024-01-02 ENCOUNTER — Other Ambulatory Visit: Payer: Self-pay | Admitting: Family Medicine

## 2024-01-04 NOTE — Telephone Encounter (Signed)
 Requested medication (s) are due for refill today- yes  Requested medication (s) are on the active medication list -yes  Future visit scheduled -no  Last refill: 12/22/23 #30  Notes to clinic: non delegated Rx  Requested Prescriptions  Pending Prescriptions Disp Refills   ALPRAZolam  (XANAX ) 0.5 MG tablet [Pharmacy Med Name: ALPRAZOLAM  0.5 MG TABLET] 30 tablet 0    Sig: TAKE 1 TABLET BY MOUTH THREE TIMES A DAY AS NEEDED     Not Delegated - Psychiatry: Anxiolytics/Hypnotics 2 Failed - 01/04/2024  3:34 PM      Failed - This refill cannot be delegated      Failed - Urine Drug Screen completed in last 360 days      Failed - Valid encounter within last 6 months    Recent Outpatient Visits           1 month ago Memory loss   [place] [facility] Pickard, Butler DASEN, MD   6 months ago Palpitations   [place] [facility] Duanne, Butler DASEN, MD   1 year ago Acute cystitis with hematuria   [place] [facility] Kayla Jeoffrey RAMAN, FNP   1 year ago Viral URI with cough   [place] [facility] Kayla Jeoffrey RAMAN, FNP   2 years ago Benign pigmented mole   [place] [facility] Pickard, Butler DASEN, MD              Passed - Patient is not pregnant         Requested Prescriptions  Pending Prescriptions Disp Refills   ALPRAZolam  (XANAX ) 0.5 MG tablet [Pharmacy Med Name: ALPRAZOLAM  0.5 MG TABLET] 30 tablet 0    Sig: TAKE 1 TABLET BY MOUTH THREE TIMES A DAY AS NEEDED     Not Delegated - Psychiatry: Anxiolytics/Hypnotics 2 Failed - 01/04/2024  3:34 PM      Failed - This refill cannot be delegated      Failed - Urine Drug Screen completed in last 360 days      Failed - Valid encounter within last 6 months    Recent Outpatient Visits           1 month ago Memory loss   [place] [facility] Duanne Butler DASEN, MD   6 months ago Palpitations   [place] [facility] Duanne Butler DASEN, MD   1 year ago Acute cystitis with hematuria   [place] [facility] Kayla Jeoffrey RAMAN, FNP   1 year ago Viral URI with cough   [place] [facility] Kayla Jeoffrey RAMAN, FNP   2 years ago Benign pigmented mole   [place] [facility] Pickard, Butler DASEN, MD              Passed - Patient is not pregnant

## 2024-01-06 ENCOUNTER — Telehealth: Payer: Self-pay | Admitting: Family Medicine

## 2024-01-06 NOTE — Telephone Encounter (Unsigned)
 Copied from CRM #[id number]. Topic: Clinical - Medication Refill >> Jan 06, 2024  3:28 PM Amy B wrote: Medication: ALPRAZolam  (XANAX ) 0.5 MG tablet  Has the patient contacted their pharmacy? Yes (Agent: If no, request that the patient contact the pharmacy for the refill. If patient does not wish to contact the pharmacy document the reason why and proceed with request.) (Agent: If yes, when and what did the pharmacy advise?)  This is the patient's preferred pharmacy:  [id number] [place], [place] - [address] [place] [address] Phone: [phone] Fax: [phone]  Is this the correct pharmacy for this prescription? Yes If no, delete pharmacy and type the correct one.   Has the prescription been filled recently? No  Is the patient out of the medication? Yes  Has the patient been seen for an appointment in the last year OR does the patient have an upcoming appointment? Yes  Can we respond through MyChart? Yes  Agent: Please be advised that Rx refills may take up to 3 business days. We ask that you follow-up with your pharmacy.

## 2024-01-06 NOTE — Telephone Encounter (Signed)
 Copied from CRM #[id number]. Topic: Clinical - Medication Question >> Jan 06, 2024  3:30 PM Amy B wrote: Reason for CRM: Patient states she calls in for a refill of ALPRAZolam  (XANAX ) 0.5 MG tablet every 10 days and requests a month supply or 90-day supply if possible instead of 10 days.  Please advise.

## 2024-04-19 ENCOUNTER — Other Ambulatory Visit

## 2024-04-19 ENCOUNTER — Ambulatory Visit

## 2024-04-26 ENCOUNTER — Inpatient Hospital Stay
